# Patient Record
Sex: Male | Born: 1940 | Hispanic: No | Smoking: Never smoker
Health system: Southern US, Community
[De-identification: ages and names within clinical notes are randomized; demographics above are authoritative.]

## PROBLEM LIST (undated history)

## (undated) DIAGNOSIS — Z992 Dependence on renal dialysis: Secondary | ICD-10-CM

## (undated) DIAGNOSIS — I2510 Atherosclerotic heart disease of native coronary artery without angina pectoris: Secondary | ICD-10-CM

## (undated) DIAGNOSIS — I5040 Unspecified combined systolic (congestive) and diastolic (congestive) heart failure: Secondary | ICD-10-CM

## (undated) DIAGNOSIS — I10 Essential (primary) hypertension: Secondary | ICD-10-CM

## (undated) DIAGNOSIS — D649 Anemia, unspecified: Secondary | ICD-10-CM

## (undated) DIAGNOSIS — I509 Heart failure, unspecified: Secondary | ICD-10-CM

## (undated) DIAGNOSIS — N186 End stage renal disease: Secondary | ICD-10-CM

## (undated) DIAGNOSIS — I429 Cardiomyopathy, unspecified: Secondary | ICD-10-CM

## (undated) DIAGNOSIS — E119 Type 2 diabetes mellitus without complications: Secondary | ICD-10-CM

## (undated) HISTORY — DX: Essential (primary) hypertension: I10

## (undated) HISTORY — DX: Dependence on renal dialysis: N18.6

## (undated) HISTORY — DX: Unspecified combined systolic (congestive) and diastolic (congestive) heart failure: I50.40

## (undated) HISTORY — DX: Cardiomyopathy, unspecified: I42.9

## (undated) HISTORY — DX: Dependence on renal dialysis: Z99.2

## (undated) HISTORY — DX: Atherosclerotic heart disease of native coronary artery without angina pectoris: I25.10

## (undated) HISTORY — PX: SPINE SURGERY: SHX786

## (undated) HISTORY — DX: Type 2 diabetes mellitus without complications: E11.9

## (undated) HISTORY — DX: Anemia, unspecified: D64.9

---

## 1998-02-21 ENCOUNTER — Emergency Department (HOSPITAL_COMMUNITY): Admission: EM | Admit: 1998-02-21 | Discharge: 1998-02-21 | Payer: Self-pay | Admitting: Emergency Medicine

## 1999-07-18 ENCOUNTER — Ambulatory Visit (HOSPITAL_COMMUNITY): Admission: RE | Admit: 1999-07-18 | Discharge: 1999-07-18 | Payer: Self-pay | Admitting: Family Medicine

## 1999-07-18 ENCOUNTER — Encounter: Payer: Self-pay | Admitting: Family Medicine

## 2003-03-03 ENCOUNTER — Encounter: Payer: Self-pay | Admitting: *Deleted

## 2003-03-03 ENCOUNTER — Ambulatory Visit (HOSPITAL_COMMUNITY): Admission: RE | Admit: 2003-03-03 | Discharge: 2003-03-03 | Payer: Self-pay | Admitting: *Deleted

## 2008-12-06 ENCOUNTER — Emergency Department (HOSPITAL_COMMUNITY): Admission: EM | Admit: 2008-12-06 | Discharge: 2008-12-06 | Payer: Self-pay | Admitting: Emergency Medicine

## 2008-12-30 ENCOUNTER — Encounter: Admission: RE | Admit: 2008-12-30 | Discharge: 2008-12-30 | Payer: Self-pay | Admitting: Family Medicine

## 2009-01-03 ENCOUNTER — Emergency Department (HOSPITAL_COMMUNITY): Admission: EM | Admit: 2009-01-03 | Discharge: 2009-01-03 | Payer: Self-pay | Admitting: Emergency Medicine

## 2009-01-17 ENCOUNTER — Emergency Department (HOSPITAL_COMMUNITY): Admission: EM | Admit: 2009-01-17 | Discharge: 2009-01-17 | Payer: Self-pay | Admitting: Emergency Medicine

## 2009-01-31 ENCOUNTER — Ambulatory Visit (HOSPITAL_COMMUNITY): Admission: RE | Admit: 2009-01-31 | Discharge: 2009-01-31 | Payer: Self-pay | Admitting: Neurosurgery

## 2009-02-20 ENCOUNTER — Inpatient Hospital Stay (HOSPITAL_COMMUNITY): Admission: RE | Admit: 2009-02-20 | Discharge: 2009-03-01 | Payer: Self-pay | Admitting: Neurosurgery

## 2009-12-27 IMAGING — CR DG CHEST 2V
3 series · 3 of 3 positions shown · non-contrast
Comparison: No priors

CLINICAL DATA: Pre admit for lumbar stenosis/cold

CHEST - 2 VIEW

[view not recorded (1 of 3)]
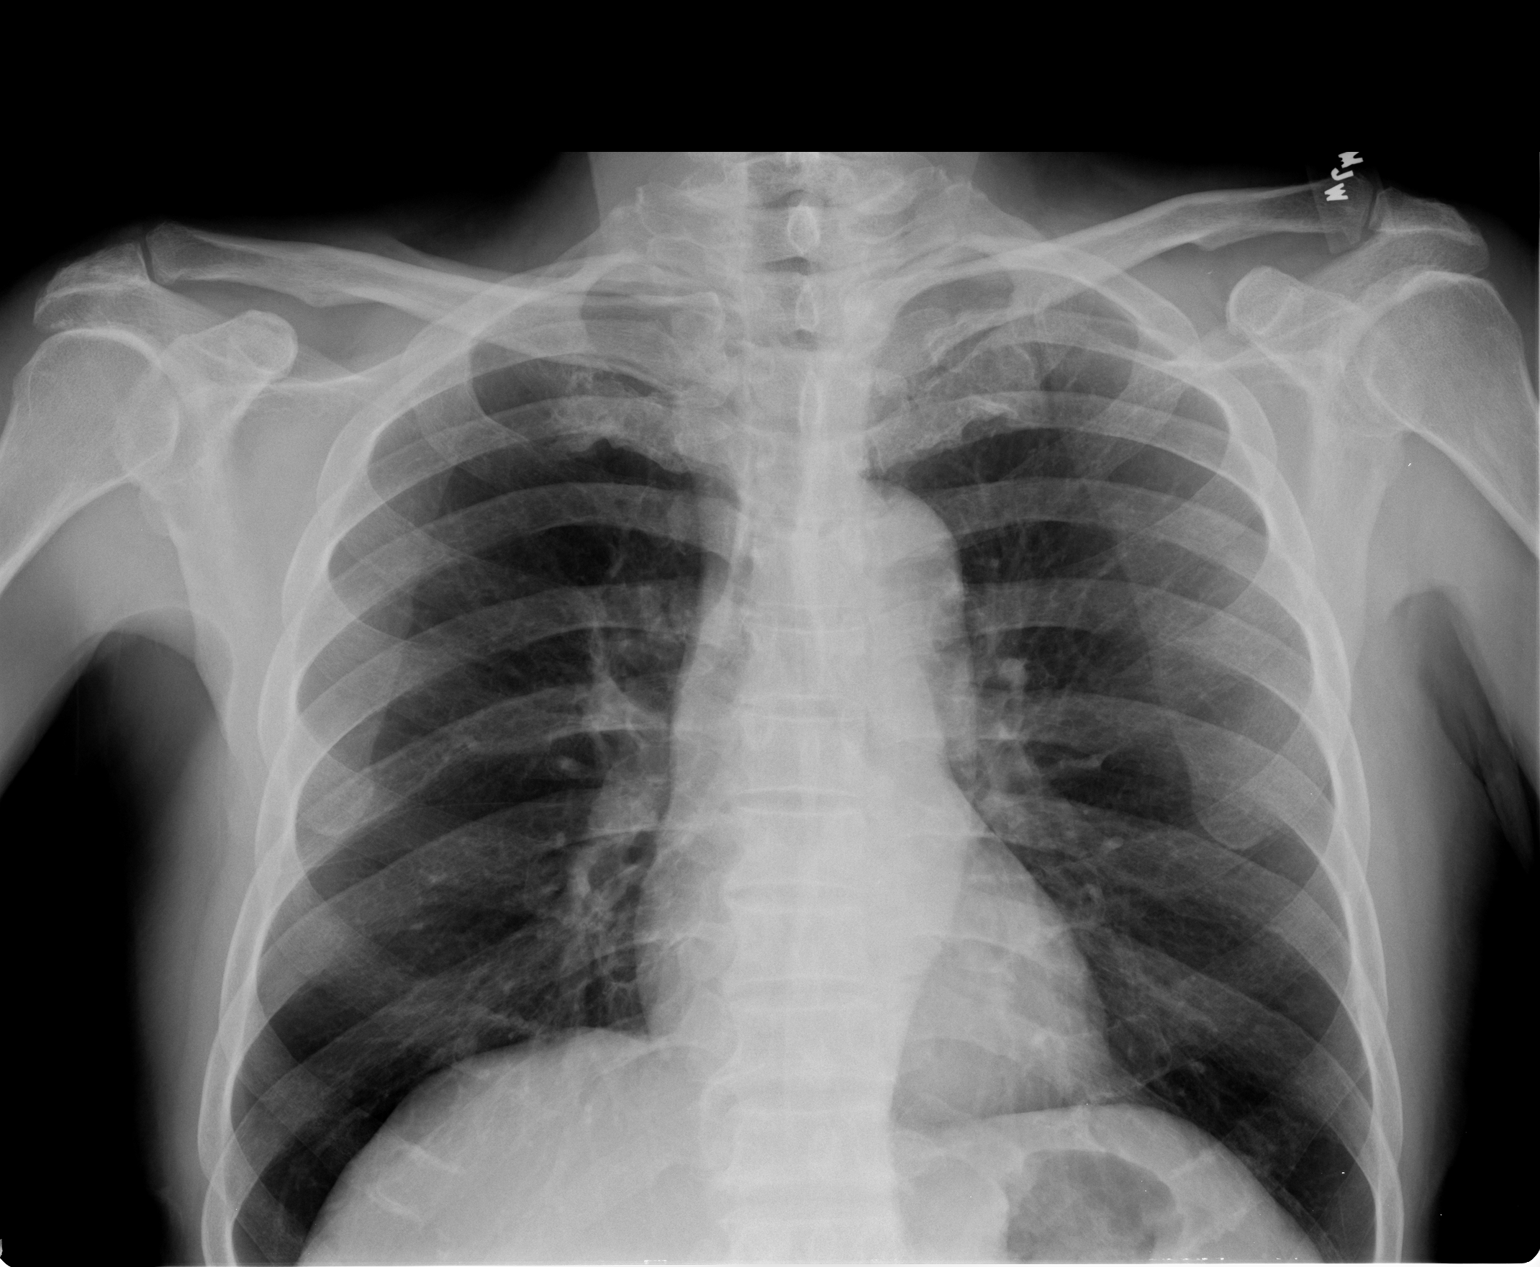

[view not recorded (2 of 3)]
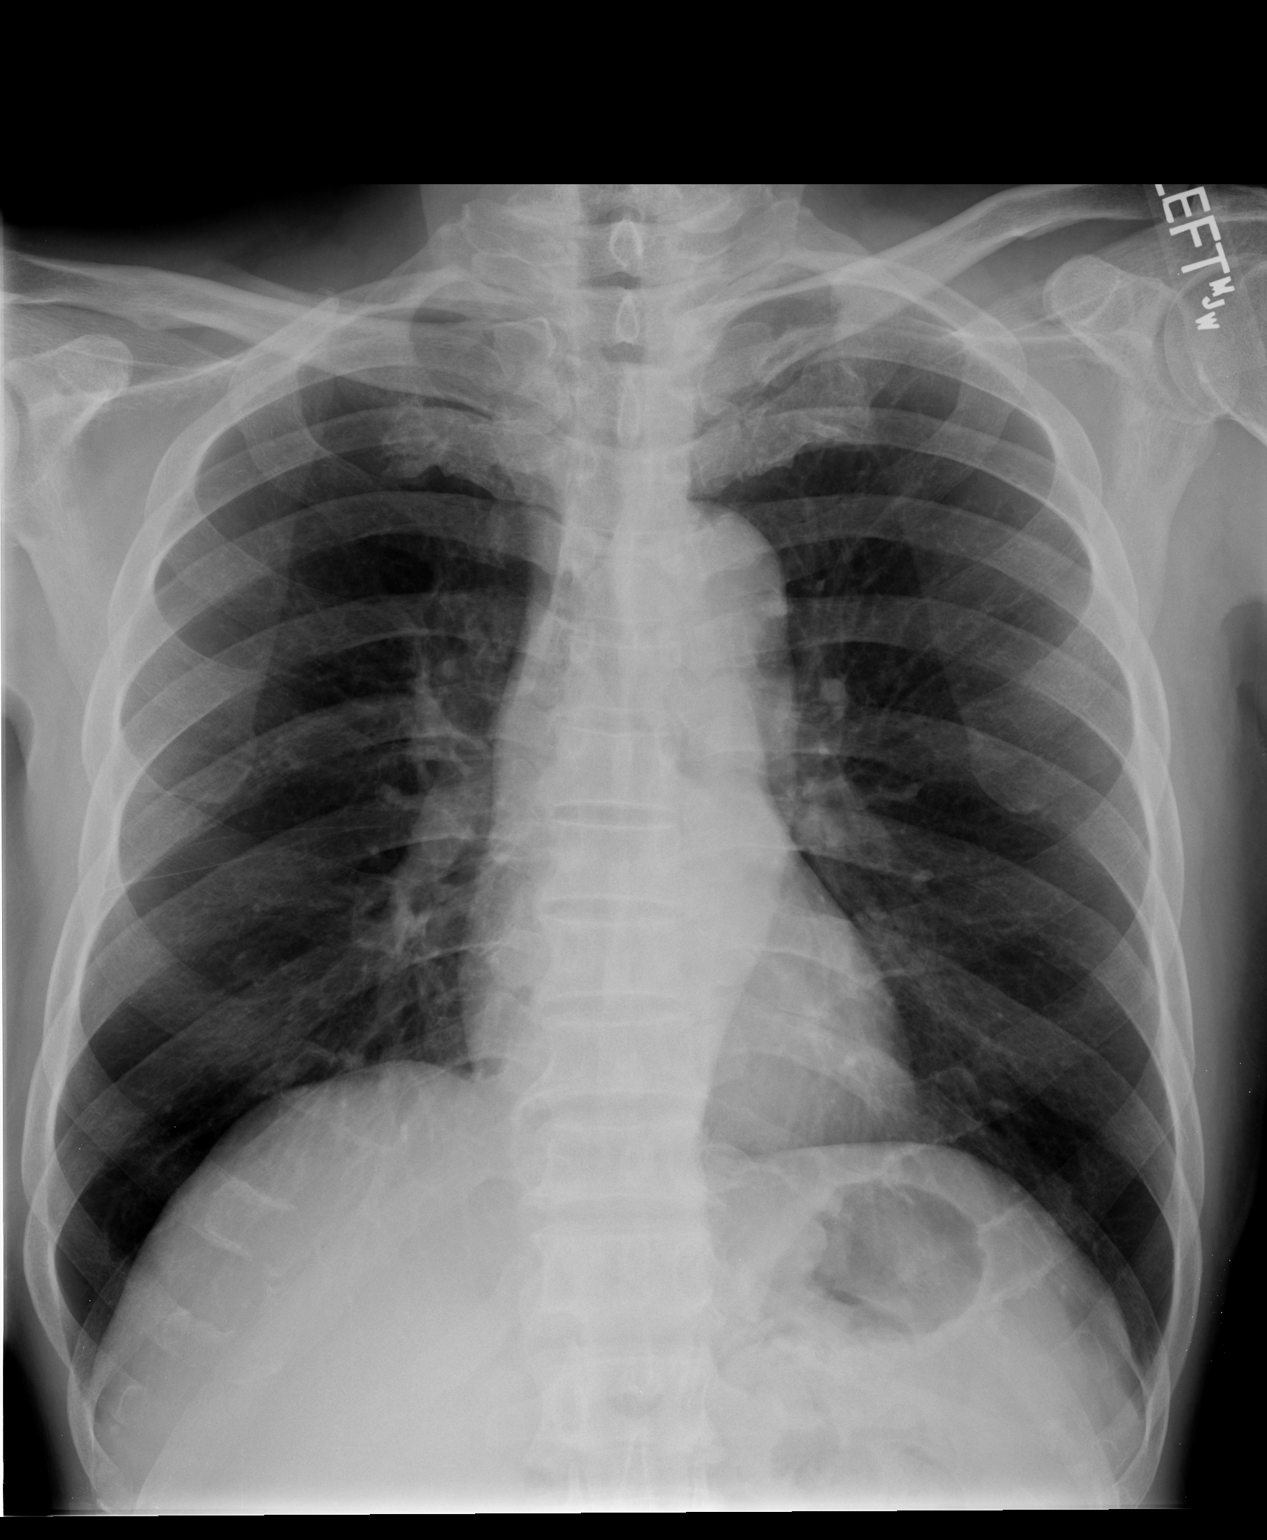

[view not recorded (3 of 3)]
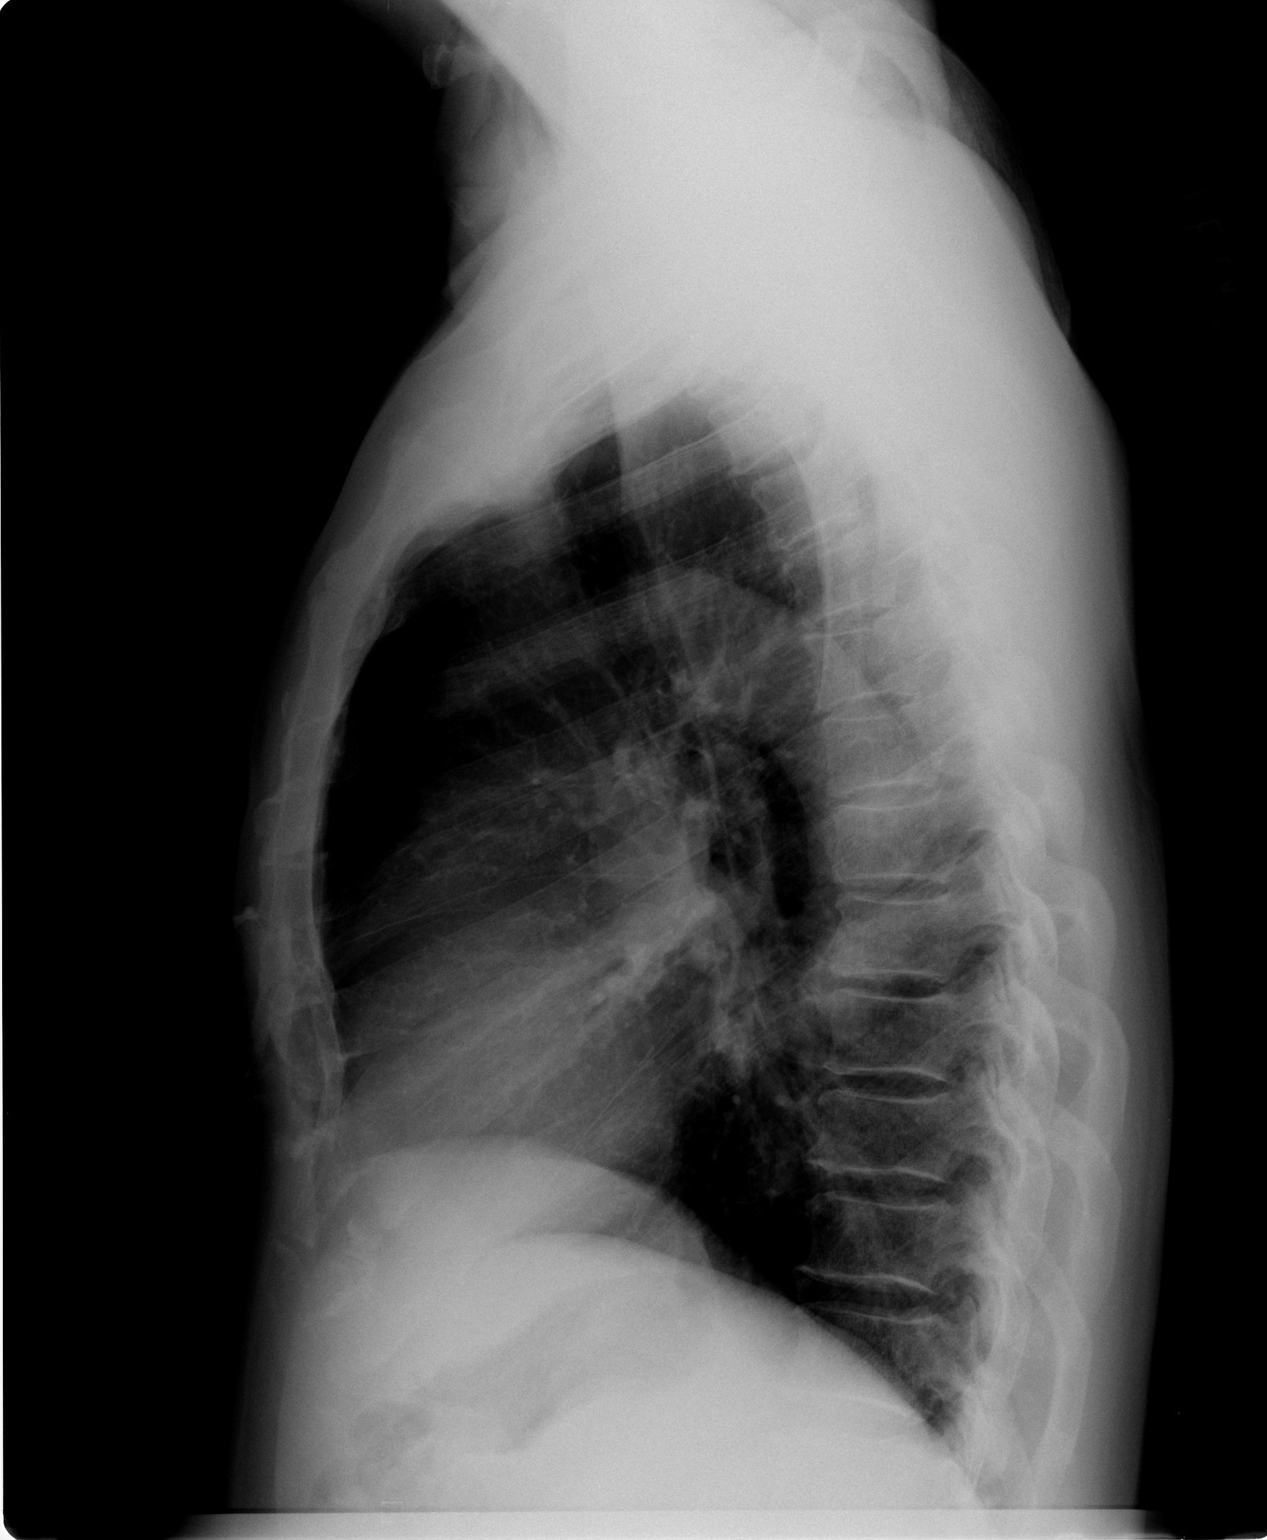

[3 of 3 positions shown; findings below may reference images not displayed]

FINDINGS: Heart normal.  Aorta mildly tortuous.  Lungs clear.
Osseous structures intact.
IMPRESSION: No active disease.

## 2010-12-01 ENCOUNTER — Encounter: Payer: Self-pay | Admitting: Family Medicine

## 2011-02-19 LAB — GLUCOSE, CAPILLARY
Glucose-Capillary: 170 mg/dL — ABNORMAL HIGH (ref 70–99)
Glucose-Capillary: 170 mg/dL — ABNORMAL HIGH (ref 70–99)
Glucose-Capillary: 172 mg/dL — ABNORMAL HIGH (ref 70–99)
Glucose-Capillary: 185 mg/dL — ABNORMAL HIGH (ref 70–99)
Glucose-Capillary: 186 mg/dL — ABNORMAL HIGH (ref 70–99)
Glucose-Capillary: 189 mg/dL — ABNORMAL HIGH (ref 70–99)
Glucose-Capillary: 191 mg/dL — ABNORMAL HIGH (ref 70–99)
Glucose-Capillary: 192 mg/dL — ABNORMAL HIGH (ref 70–99)
Glucose-Capillary: 193 mg/dL — ABNORMAL HIGH (ref 70–99)
Glucose-Capillary: 193 mg/dL — ABNORMAL HIGH (ref 70–99)
Glucose-Capillary: 195 mg/dL — ABNORMAL HIGH (ref 70–99)
Glucose-Capillary: 207 mg/dL — ABNORMAL HIGH (ref 70–99)
Glucose-Capillary: 207 mg/dL — ABNORMAL HIGH (ref 70–99)
Glucose-Capillary: 209 mg/dL — ABNORMAL HIGH (ref 70–99)
Glucose-Capillary: 225 mg/dL — ABNORMAL HIGH (ref 70–99)
Glucose-Capillary: 225 mg/dL — ABNORMAL HIGH (ref 70–99)
Glucose-Capillary: 234 mg/dL — ABNORMAL HIGH (ref 70–99)
Glucose-Capillary: 243 mg/dL — ABNORMAL HIGH (ref 70–99)
Glucose-Capillary: 244 mg/dL — ABNORMAL HIGH (ref 70–99)
Glucose-Capillary: 266 mg/dL — ABNORMAL HIGH (ref 70–99)
Glucose-Capillary: 282 mg/dL — ABNORMAL HIGH (ref 70–99)
Glucose-Capillary: 299 mg/dL — ABNORMAL HIGH (ref 70–99)
Glucose-Capillary: 104 mg/dL — ABNORMAL HIGH (ref 70–99)
Glucose-Capillary: 113 mg/dL — ABNORMAL HIGH (ref 70–99)
Glucose-Capillary: 153 mg/dL — ABNORMAL HIGH (ref 70–99)
Glucose-Capillary: 174 mg/dL — ABNORMAL HIGH (ref 70–99)
Glucose-Capillary: 200 mg/dL — ABNORMAL HIGH (ref 70–99)
Glucose-Capillary: 203 mg/dL — ABNORMAL HIGH (ref 70–99)
Glucose-Capillary: 228 mg/dL — ABNORMAL HIGH (ref 70–99)
Glucose-Capillary: 99 mg/dL (ref 70–99)

## 2011-02-19 LAB — URINE CULTURE: Culture: NO GROWTH

## 2011-02-19 LAB — BASIC METABOLIC PANEL
CO2: 27 mEq/L (ref 19–32)
CO2: 30 mEq/L (ref 19–32)
Calcium: 9.2 mg/dL (ref 8.4–10.5)
Chloride: 91 mEq/L — ABNORMAL LOW (ref 96–112)
Chloride: 96 mEq/L (ref 96–112)
Creatinine, Ser: 0.93 mg/dL (ref 0.4–1.5)
GFR calc Af Amer: >60 mL/min (ref >60)
GFR calc Af Amer: >60 mL/min (ref >60)
GFR calc Af Amer: >60 mL/min (ref >60)
GFR calc non Af Amer: >60 mL/min (ref >60)
Potassium: 3.4 mEq/L — ABNORMAL LOW (ref 3.5–5.1)
Potassium: 3.6 mEq/L (ref 3.5–5.1)
Potassium: 5 mEq/L (ref 3.5–5.1)
Sodium: 128 mEq/L — ABNORMAL LOW (ref 135–145)
Sodium: 136 mEq/L (ref 135–145)

## 2011-02-19 LAB — CULTURE, BLOOD (ROUTINE X 2): Culture: NO GROWTH

## 2011-02-19 LAB — CBC
HCT: 25.7 % — ABNORMAL LOW (ref 39.0–52.0)
HCT: 38.2 % — ABNORMAL LOW (ref 39.0–52.0)
Hemoglobin: 13.3 g/dL (ref 13.0–17.0)
MCHC: 34.9 g/dL (ref 30.0–36.0)
MCV: 92.2 fL (ref 78.0–100.0)
RBC: 2.78 MIL/uL — ABNORMAL LOW (ref 4.22–5.81)
RBC: 4.16 MIL/uL — ABNORMAL LOW (ref 4.22–5.81)

## 2011-02-19 LAB — URINALYSIS, ROUTINE W REFLEX MICROSCOPIC
Hgb urine dipstick: NEGATIVE
Ketones, ur: 15 mg/dL — AB
Nitrite: POSITIVE — AB
Urobilinogen, UA: 1 mg/dL (ref 0.0–1.0)

## 2011-02-19 LAB — URINE MICROSCOPIC-ADD ON

## 2011-02-20 LAB — URINALYSIS, ROUTINE W REFLEX MICROSCOPIC
Bilirubin Urine: NEGATIVE
Leukocytes, UA: NEGATIVE
Nitrite: NEGATIVE
Specific Gravity, Urine: 1.04 — ABNORMAL HIGH (ref 1.005–1.030)
Urobilinogen, UA: 0.2 mg/dL (ref 0.0–1.0)
pH: 6 (ref 5.0–8.0)

## 2011-02-20 LAB — BASIC METABOLIC PANEL
BUN: 17 mg/dL (ref 6–23)
Calcium: 9.6 mg/dL (ref 8.4–10.5)
Creatinine, Ser: 0.78 mg/dL (ref 0.4–1.5)
GFR calc non Af Amer: >60 mL/min (ref >60)
Glucose, Bld: 334 mg/dL — ABNORMAL HIGH (ref 70–99)
Potassium: 4.7 mEq/L (ref 3.5–5.1)

## 2011-02-20 LAB — CBC
HCT: 42.3 % (ref 39.0–52.0)
Platelets: 229 10*3/uL (ref 150–400)
RDW: 12.8 % (ref 11.5–15.5)

## 2011-02-20 LAB — URINE MICROSCOPIC-ADD ON

## 2011-02-20 LAB — GLUCOSE, CAPILLARY: Glucose-Capillary: 342 mg/dL — ABNORMAL HIGH (ref 70–99)

## 2011-03-25 NOTE — Op Note (Signed)
NAMEADREN, DOLLINS                  ACCOUNT NO.:  [id number]   MEDICAL RECORD NO.:  [id number]          PATIENT TYPE:  INP   LOCATION:  3018                         FACILITY:  [facility]   PHYSICIAN:  Hilda Lias, M.D.   DATE OF BIRTH:  28-Jul-1941   DATE OF PROCEDURE:  02/20/2009  DATE OF DISCHARGE:                               OPERATIVE REPORT   PREOPERATIVE DIAGNOSIS:  Lumbar stenosis with left L3-L4 radiculopathy.   POSTOPERATIVE DIAGNOSIS:  Lumbar stenosis with left L3-L4 radiculopathy.   PROCEDURES:  1. Bilateral L2, 3, 4, 5 laminectomy.  2. Foraminotomy to decompress the L2, 3, 4, 5, S1 nerve root.  3. Repair of a small midline iatrogenic leak post-epidural injection.   SURGEON:  Hilda Lias, MD   ASSISTANT:  Clydene Fake, MD   CLINICAL HISTORY:  Mr. Cypert is a 70 year old gentleman who came to my  office complaining of back pain with radiation to both the legs, the  left worse than right one.  The patient has a history of diabetes.  The  patient had epidural injections with no improvement.  X-rays showed  severe stenosis from 2-3 down to 5-1.  Surgery was advised.   PROCEDURE:  The patient was taken to the OR, and after intubation, he  was positioned in a prone manner.  The skin was cleaned with DuraPrep.  Midline incision from L2 down to L5-S1 was made and muscle was retracted  bilaterally.  We were able to identify the spine x-ray, the spinous  process of 2, 3, 4, 5.  Using the Stille rongeur, we removed the spinous  process of 2, 3, 4, 5.  Immediately, we found that the patient had  severe stenosis at the level 3, 4, 5, with overgrow of the facet and  thickness of the ligament.  For that, we had to use a 1- and 2-mm  Kerrison punch to remove the yellow ligament.  There was an area where  the patient had the epidural injection because the yellow ligament was  kind of whitish.  Once we decompressed the area, there was a small  pinpoint dura matter which was taken  care with a single stitch of 6-0  Prolene.  Then using the drill as well as 2- and 3-mm Kerrison punch, we  decompressed bilaterally.  Although the central stenosis was severe all  over the foramina, the worse was at the level of 3-4 and 4-5.  At the  end, we had plane of decompression.  We investigated every single nerve  roots and there was no evidence of any ruptured disk.  Valsalva maneuver  was negative.  Then using the compression, we did a posterolateral  arthrodesis from L2 through L5-S1 applying the bone in the lateral  aspect of the facet.  Then, the wound was closed with Vicryl and Steri-  Strips.           ______________________________  Hilda Lias, M.D.     EB/MEDQ  D:  02/20/2009  T:  02/21/2009  Job:  [id number]

## 2011-03-28 NOTE — Discharge Summary (Signed)
Albert Grant, Albert Grant                  ACCOUNT NO.:  [id number]   MEDICAL RECORD NO.:  [id number]          PATIENT TYPE:  INP   LOCATION:  3018                         FACILITY:  [facility]   PHYSICIAN:  Hilda Lias, M.D.   DATE OF BIRTH:  May 14, 1941   DATE OF ADMISSION:  02/20/2009  DATE OF DISCHARGE:  03/01/2009                               DISCHARGE SUMMARY   ADMISSION DIAGNOSIS:  Lumbar stenosis, L2 to L5-S1.   FINAL DIAGNOSIS:  Lumbar stenosis, L2 to L5-S1.   CLINICAL HISTORY:  Mr. Stancil is a gentleman who came to my office  complaining of back pain worse to the both legs with quite a bit of  proximal weakness.  The patient has failed with conservative treatment.  X-rays showed that he has diffuse stenosis from L2 down to L5-S1.  The  patient has failed with conservative treatment to an epidural injection.  Laboratory normal.   HOSPITAL COURSE:  The patient was taken to surgery and bilateral  laminectomy from L2 to L5-S1 was done followed by a posterolateral  arthrodesis with autograft.  After surgery, the patient developed quite  a bit of pain.  He has poor physical activity and he was quite sedated.  He was seen by Physical Therapy.  Eventually, all the medications for  pain were stopped.  He was given once orally p.o. Tylenol.  After  several days, he was more awake, more active, and then on March 02, 2009, the patient went home.  The pain he was complaining was mostly  incisional pain.   CONDITION ON DISCHARGE:  Improvement.   MEDICATIONS:  He went home with tramadol for pain and Flexeril.   DIET:  Regular.   ACTIVITY:  Not to bend, not to do any lifting.   FOLLOWUP:  He will be seen by me in my office in 4 weeks.  He is going  to have outpatient physical therapy.           ______________________________  Hilda Lias, M.D.     EB/MEDQ  D:  04/17/2009  T:  04/17/2009  Job:  [id number]

## 2013-08-10 ENCOUNTER — Other Ambulatory Visit (HOSPITAL_COMMUNITY): Payer: Self-pay | Admitting: Internal Medicine

## 2013-08-10 DIAGNOSIS — R109 Unspecified abdominal pain: Secondary | ICD-10-CM

## 2013-08-11 ENCOUNTER — Encounter (HOSPITAL_COMMUNITY): Admission: RE | Admit: 2013-08-11 | Payer: Medicare Other | Source: Ambulatory Visit

## 2013-09-04 ENCOUNTER — Inpatient Hospital Stay: Payer: Self-pay | Admitting: Internal Medicine

## 2013-09-04 LAB — COMPREHENSIVE METABOLIC PANEL
Albumin: 2.5 g/dL — ABNORMAL LOW (ref 3.4–5.0)
Alkaline Phosphatase: 183 U/L — ABNORMAL HIGH (ref 50–136)
BUN: 40 mg/dL — ABNORMAL HIGH (ref 7–18)
Calcium, Total: 8 mg/dL — ABNORMAL LOW (ref 8.5–10.1)
Co2: 27 mmol/L (ref 21–32)
Creatinine: 4.85 mg/dL — ABNORMAL HIGH (ref 0.60–1.30)
EGFR (Non-African Amer.): 11 — ABNORMAL LOW
Osmolality: 284 (ref 275–301)
Potassium: 3.9 mmol/L (ref 3.5–5.1)
SGOT(AST): 30 U/L (ref 15–37)
SGPT (ALT): 16 U/L (ref 12–78)
Sodium: 134 mmol/L — ABNORMAL LOW (ref 136–145)
Total Protein: 7 g/dL (ref 6.4–8.2)

## 2013-09-04 LAB — CBC WITH DIFFERENTIAL/PLATELET
Basophil %: 0.6 %
Eosinophil #: 0.6 10*3/uL (ref 0.0–0.7)
Eosinophil %: 4.1 %
HCT: 32.3 % — ABNORMAL LOW (ref 40.0–52.0)
HGB: 10.9 g/dL — ABNORMAL LOW (ref 13.0–18.0)
Lymphocyte #: 0.9 10*3/uL — ABNORMAL LOW (ref 1.0–3.6)
Lymphocyte %: 6 %
MCH: 30.4 pg (ref 26.0–34.0)
MCHC: 33.8 g/dL (ref 32.0–36.0)
MCV: 90 fL (ref 80–100)
Monocyte #: 0.5 x10 3/mm (ref 0.2–1.0)
Monocyte %: 3.6 %
Neutrophil #: 12.5 10*3/uL — ABNORMAL HIGH (ref 1.4–6.5)
RDW: 16.3 % — ABNORMAL HIGH (ref 11.5–14.5)
WBC: 14.6 10*3/uL — ABNORMAL HIGH (ref 3.8–10.6)

## 2013-09-04 LAB — CK TOTAL AND CKMB (NOT AT ARMC)
CK, Total: 95 U/L (ref 35–232)
CK, Total: 96 U/L (ref 35–232)
CK-MB: 3.1 ng/mL (ref 0.5–3.6)
CK-MB: 3.7 ng/mL — ABNORMAL HIGH (ref 0.5–3.6)
CK-MB: 3.8 ng/mL — ABNORMAL HIGH (ref 0.5–3.6)

## 2013-09-04 LAB — TROPONIN I
Troponin-I: 0.07 ng/mL — ABNORMAL HIGH
Troponin-I: 0.76 ng/mL — ABNORMAL HIGH
Troponin-I: 0.65 ng/mL — ABNORMAL HIGH

## 2013-09-05 LAB — BASIC METABOLIC PANEL
Anion Gap: 8 (ref 7–16)
BUN: 41 mg/dL — ABNORMAL HIGH (ref 7–18)
Chloride: 98 mmol/L (ref 98–107)
Co2: 29 mmol/L (ref 21–32)
Glucose: 137 mg/dL — ABNORMAL HIGH (ref 65–99)
Sodium: 135 mmol/L — ABNORMAL LOW (ref 136–145)

## 2013-09-05 LAB — HEMOGLOBIN A1C: Hemoglobin A1C: 5.9 % (ref 4.2–6.3)

## 2013-09-05 LAB — CBC WITH DIFFERENTIAL/PLATELET
Basophil #: 0.1 10*3/uL (ref 0.0–0.1)
Basophil %: 1.4 %
Eosinophil %: 2.9 %
HCT: 27 % — ABNORMAL LOW (ref 40.0–52.0)
HGB: 9.2 g/dL — ABNORMAL LOW (ref 13.0–18.0)
MCHC: 33.9 g/dL (ref 32.0–36.0)
MCV: 89 fL (ref 80–100)
Monocyte %: 8.3 %
Neutrophil #: 2.7 10*3/uL (ref 1.4–6.5)
Neutrophil %: 69.2 %
Platelet: 199 10*3/uL (ref 150–440)
RBC: 3.02 10*6/uL — ABNORMAL LOW (ref 4.40–5.90)
RDW: 15.8 % — ABNORMAL HIGH (ref 11.5–14.5)

## 2013-09-05 LAB — PHOSPHORUS: Phosphorus: 4.1 mg/dL (ref 2.5–4.9)

## 2013-09-06 LAB — BASIC METABOLIC PANEL
BUN: 27 mg/dL — ABNORMAL HIGH (ref 7–18)
Chloride: 99 mmol/L (ref 98–107)
Co2: 30 mmol/L (ref 21–32)
Creatinine: 3.31 mg/dL — ABNORMAL HIGH (ref 0.60–1.30)
EGFR (African American): 20 — ABNORMAL LOW
Glucose: 120 mg/dL — ABNORMAL HIGH (ref 65–99)
Osmolality: 273 (ref 275–301)
Potassium: 4.1 mmol/L (ref 3.5–5.1)

## 2013-09-09 LAB — CULTURE, BLOOD (SINGLE)

## 2013-10-08 ENCOUNTER — Other Ambulatory Visit: Payer: Self-pay | Admitting: Nephrology

## 2013-10-08 LAB — HEMOGLOBIN: HGB: 8.3 g/dL — ABNORMAL LOW (ref 13.0–18.0)

## 2013-10-18 ENCOUNTER — Non-Acute Institutional Stay (SKILLED_NURSING_FACILITY): Payer: Medicare Other | Admitting: Internal Medicine

## 2013-10-18 DIAGNOSIS — I2589 Other forms of chronic ischemic heart disease: Secondary | ICD-10-CM

## 2013-10-18 DIAGNOSIS — E1129 Type 2 diabetes mellitus with other diabetic kidney complication: Secondary | ICD-10-CM

## 2013-10-18 DIAGNOSIS — N186 End stage renal disease: Secondary | ICD-10-CM

## 2013-10-18 DIAGNOSIS — I2510 Atherosclerotic heart disease of native coronary artery without angina pectoris: Secondary | ICD-10-CM

## 2013-10-18 DIAGNOSIS — I255 Ischemic cardiomyopathy: Secondary | ICD-10-CM

## 2013-12-12 ENCOUNTER — Encounter: Payer: Self-pay | Admitting: *Deleted

## 2013-12-12 ENCOUNTER — Non-Acute Institutional Stay (SKILLED_NURSING_FACILITY): Payer: Medicare Other | Admitting: Family

## 2013-12-12 DIAGNOSIS — T7840XA Allergy, unspecified, initial encounter: Secondary | ICD-10-CM

## 2013-12-12 DIAGNOSIS — Z992 Dependence on renal dialysis: Secondary | ICD-10-CM

## 2013-12-12 DIAGNOSIS — T783XXA Angioneurotic edema, initial encounter: Secondary | ICD-10-CM

## 2013-12-12 DIAGNOSIS — N186 End stage renal disease: Secondary | ICD-10-CM

## 2013-12-14 ENCOUNTER — Encounter: Payer: Self-pay | Admitting: Family

## 2013-12-14 DIAGNOSIS — T7840XA Allergy, unspecified, initial encounter: Secondary | ICD-10-CM | POA: Insufficient documentation

## 2013-12-14 NOTE — Progress Notes (Signed)
Patient ID: Albert Grant, male   DOB: [birth year], 73 y.o.   MRN: [id number]  Date: 12/14/13 Facility: [facility]  Code Status:  Full Code  Chief Complaint  Patient presents with  . Acute Visit    Angioedema /Itching    HPI: Pt presents with onset of facial swelling and itching. Pt noted taking Lisinopril and denies allergies to medications. Pt denies visual disturbances, nausea, vomiting, diarrhea, chest pain, SOB or HA. Pt denies history of allergic reactions to medications/environment/ detergents/ fibers.  Pt reports associated itching to trunk and BUE/BLE. Pt endorses fear of new medications added to treatment plan for fear of allergic reaction.  No further issues/concerns expressed at present.     Allergies  Allergen Reactions  . Lisinopril      Medication List       This list is accurate as of: 12/12/13 11:59 PM.  Always use your most recent med list.               atorvastatin 80 MG tablet  Commonly known as:  LIPITOR  Take 80 mg by mouth at bedtime. For hypercholesterolemia     carvedilol 25 MG tablet  Commonly known as:  COREG  Take 25 mg by mouth 2 (two) times daily with a meal. For HTN     clopidogrel 75 MG tablet  Commonly known as:  PLAVIX  Take 75 mg by mouth daily with breakfast.     feeding supplement (PRO-STAT SUGAR FREE 64) Liqd  Take 30 mLs by mouth 2 (two) times daily.     ferrous sulfate 325 (65 FE) MG tablet  Take 325 mg by mouth 3 (three) times daily with meals.     hydrALAZINE 50 MG tablet  Commonly known as:  APRESOLINE  Take 50 mg by mouth 3 (three) times daily. For HTN     insulin aspart 100 UNIT/ML injection  Commonly known as:  novoLOG  Inject 2 Units into the skin 3 (three) times daily before meals.     insulin glargine 100 UNIT/ML injection  Commonly known as:  LANTUS  Inject 3 Units into the skin at bedtime.     isosorbide mononitrate 60 MG 24 hr tablet  Commonly known as:  IMDUR  Take 60 mg by mouth daily. For HTN     lisinopril 20 MG tablet  Commonly known as:  PRINIVIL,ZESTRIL  Take 20 mg by mouth at bedtime.     metoprolol tartrate 12.5 mg Tabs tablet  Commonly known as:  LOPRESSOR  Take 12.5 mg by mouth daily.     nitroGLYCERIN 0.4 MG SL tablet  Commonly known as:  NITROSTAT  Place 0.4 mg under the tongue every 5 (five) minutes as needed for chest pain (May repeat for 3 doses as needed for chest pain. If chest pain persist call the MD.).     ondansetron 4 MG tablet  Commonly known as:  ZOFRAN  Take 4 mg by mouth every 8 (eight) hours as needed for nausea or vomiting.     pantoprazole 40 MG tablet  Commonly known as:  PROTONIX  Take 40 mg by mouth 2 (two) times daily. For esophageal reflux     PHOSLO 667 MG capsule  Generic drug:  calcium acetate  Take 667 mg by mouth 3 (three) times daily with meals.     ROBITUSSIN CHEST CONGESTION 100 MG/5ML syrup  Generic drug:  guaifenesin  Take 200 mg by mouth every 4 (four) hours as needed for cough. For  cough     saccharomyces boulardii 250 MG capsule  Commonly known as:  FLORASTOR  Take 250 mg by mouth 2 (two) times daily. For 14  Days     sertraline 100 MG tablet  Commonly known as:  ZOLOFT  Take 100 mg by mouth daily. For mood     SUPER B COMPLEX Tabs  Take by mouth daily.         DATA REVIEWED  Laboratory Studies: Reviewed     Past Medical History  Diagnosis Date  . 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 Hypertension   . CAD (coronary artery disease)      with angioplasty  . 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 Cardiomyopathy     with ejection fraction of 45 %  . Heart failure, systolic and diastolic   . Anemia     Past Surgical History  Procedure Laterality Date  . Spine surgery      spinal fusion      Review of Systems  Respiratory: Negative.  Negative for cough and shortness of breath.   Cardiovascular: Negative.   Skin: Positive for itching and rash.       Facial Swelling    Neurological: Negative.      Physical Exam Filed Vitals:   12/14/13 1519  BP: 142/66  Pulse: 84  Temp: 97.8 F (36.6 C)  Resp: 20   There is no height or weight on file to calculate BMI. Physical Exam  Constitutional: He is oriented to person, place, and time.  HENT:  Facial Edema  Cardiovascular: Normal rate and regular rhythm.   Pulmonary/Chest: Effort normal and breath sounds normal.  Neurological: He is alert and oriented to person, place, and time.  Skin: Rash noted. Rash is maculopapular. There is erythema.  Erythema to face Maculopapular rash to BUE/BLE     ASSESSMENT/PLAN  Benadryl 12. 5 mg po x 1  Lisinopril d/c-Added to list of allergies Metoprolol 12.5 mg daily w/ hold parameters Stat CBC w/ diff Prednisone 20 mg po daily x 5 days-Collaboration  With Dr. Jefm Milesobson Sarna Lotion to intact skin Triamcinolone topical 0.5% bid to BUE/BLE and trunk for 10 days   Follow up:prn

## 2013-12-16 ENCOUNTER — Encounter: Payer: Self-pay | Admitting: Vascular Surgery

## 2013-12-16 ENCOUNTER — Other Ambulatory Visit: Payer: Self-pay | Admitting: *Deleted

## 2013-12-19 ENCOUNTER — Inpatient Hospital Stay (HOSPITAL_COMMUNITY): Admission: RE | Admit: 2013-12-19 | Payer: Medicare Other | Source: Ambulatory Visit

## 2013-12-19 ENCOUNTER — Other Ambulatory Visit: Payer: Self-pay | Admitting: Vascular Surgery

## 2013-12-19 ENCOUNTER — Ambulatory Visit: Payer: Medicare Other | Admitting: Vascular Surgery

## 2013-12-19 ENCOUNTER — Encounter (HOSPITAL_COMMUNITY): Payer: Medicare Other

## 2013-12-19 DIAGNOSIS — Z01810 Encounter for preprocedural cardiovascular examination: Secondary | ICD-10-CM

## 2013-12-19 DIAGNOSIS — N186 End stage renal disease: Secondary | ICD-10-CM

## 2013-12-22 ENCOUNTER — Encounter: Payer: Self-pay | Admitting: Internal Medicine

## 2013-12-22 DIAGNOSIS — I2510 Atherosclerotic heart disease of native coronary artery without angina pectoris: Secondary | ICD-10-CM | POA: Insufficient documentation

## 2013-12-22 DIAGNOSIS — I255 Ischemic cardiomyopathy: Secondary | ICD-10-CM | POA: Insufficient documentation

## 2013-12-22 NOTE — Progress Notes (Signed)
Patient ID: Albert Grant, male   DOB: 01/13/1941, 73 y.o.   MRN: [id number]               HISTORY & PHYSICAL  DATE: 10/18/2013     FACILITY: [facility]  LEVEL OF CARE: SNF (31)  ALLERGIES:  Allergies  Allergen Reactions  . Lisinopril     CHIEF COMPLAINT:  Manage cardiomyopathy, diabetes mellitus, and CAD.    HISTORY OF PRESENT ILLNESS:  The patient is a 73 year-old, [place] male who was admitted to this facility for short-term rehabilitation.  He has the following problems:    CARDIOMYOPATHY: The patient's cardiomyopathy remains stable. Patient denies increasing lower extremity swelling, shortness of breath, chest pain, palpitations, orthopnea or PNDs. No complications reported from the medications currently being used.    DM:pt's DM remains stable.  Pt denies polyuria, polydipsia, polyphagia, changes in vision or hypoglycemic episodes.  No complications noted from the medication presently being used.  Last hemoglobin A1c is:    Not available.    CAD: The angina has been stable. The patient denies dyspnea on exertion, orthopnea, pedal edema, palpitations and paroxysmal nocturnal dyspnea. No complications noted from the medication presently being used.    PAST MEDICAL HISTORY :  Past Medical History  Diagnosis Date  . 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 Hypertension   . CAD (coronary artery disease)      with angioplasty  . 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 Cardiomyopathy     with ejection fraction of 45 %  . Heart failure, systolic and diastolic   . Anemia    CHF.    PAST SURGICAL HISTORY: Past Surgical History  Procedure Laterality Date  . Spine surgery      spinal fusion   AV fistula placement.     SOCIAL HISTORY: MARITAL HISTORY:  The patient is divorced.   TOBACCO USE:  Denies tobacco use. ALCOHOL:  Denies alcohol use. ILLICIT DRUGS:  Denies illicit drug use.    FAMILY HISTORY: The patient cannot recall.     CURRENT MEDICATIONS: Reviewed per [facility]  REVIEW OF SYSTEMS:  See HPI otherwise 14 point ROS is negative.  PHYSICAL EXAMINATION  VS:  Not recorded.    GENERAL: no acute distress, normal body habitus EYES: conjunctivae normal, sclerae normal, normal eye lids MOUTH/THROAT: lips without lesions,no lesions in the mouth,tongue is without lesions,uvula elevates in midline NECK: supple, trachea midline, no neck masses, no thyroid tenderness, no thyromegaly LYMPHATICS: no LAN in the neck, no supraclavicular LAN RESPIRATORY: breathing is even & unlabored, BS CTAB CARDIAC: RRR, no murmur,no extra heart sounds, no edema GI:  ABDOMEN: abdomen soft, normal BS, no masses, no tenderness  LIVER/SPLEEN: no hepatomegaly, no splenomegaly MUSCULOSKELETAL: HEAD: normal to inspection & palpation BACK: no kyphosis, scoliosis or spinal processes tenderness EXTREMITIES: LEFT UPPER EXTREMITY: full range of motion, normal strength & tone RIGHT UPPER EXTREMITY:  full range of motion, normal strength & tone LEFT LOWER EXTREMITY:  full range of motion, normal strength & tone RIGHT LOWER EXTREMITY:  full range of motion, normal strength & tone PSYCHIATRIC: the patient is alert & oriented to person, affect & behavior appropriate  LABS/RADIOLOGY: In 06/2013:  Glucose 164, sodium 134, potassium 4.2, chloride 102, bicarbonate 24, BUN 18, creatinine 3.7, calcium 8.6, albumin 2.3, total bilirubin 0.8, alkaline phosphatase 199, AST 22, ALT 25.    WBC 4.9, hemoglobin 9.7, platelets 210.  ASSESSMENT/PLAN:  Cardiomyopathy.  Well compensated.    Diabetes mellitus with renal complications.  Check hemoglobin A1c.    CAD.  Stable.     End-stage renal disease.  On hemodialysis.    Anemia of chronic kidney disease.  Continue iron.  Reassess hemoglobin level.     Hyperlipidemia.  Check fasting lipid panel.    Check CBC and CMP.    THN Metrics:   Not on aspirin.  Nonsmoker.    I have reviewed patient's medical  records received at admission/from hospitalization.  CPT CODE: [id number]

## 2014-01-09 ENCOUNTER — Encounter: Payer: Self-pay | Admitting: Vascular Surgery

## 2014-01-10 ENCOUNTER — Other Ambulatory Visit: Payer: Self-pay | Admitting: *Deleted

## 2014-01-10 ENCOUNTER — Encounter: Payer: Self-pay | Admitting: *Deleted

## 2014-01-10 ENCOUNTER — Encounter: Payer: Self-pay | Admitting: Vascular Surgery

## 2014-01-10 ENCOUNTER — Ambulatory Visit (INDEPENDENT_AMBULATORY_CARE_PROVIDER_SITE_OTHER)
Admission: RE | Admit: 2014-01-10 | Discharge: 2014-01-10 | Disposition: A | Discharge status: Home / Self Care | Payer: Medicare Other | Source: Ambulatory Visit | Attending: Vascular Surgery | Admitting: Vascular Surgery

## 2014-01-10 ENCOUNTER — Ambulatory Visit (HOSPITAL_COMMUNITY)
Admission: RE | Admit: 2014-01-10 | Discharge: 2014-01-10 | Disposition: A | Discharge status: Home / Self Care | Payer: Medicare Other | Source: Ambulatory Visit | Attending: Vascular Surgery | Admitting: Vascular Surgery

## 2014-01-10 ENCOUNTER — Ambulatory Visit (INDEPENDENT_AMBULATORY_CARE_PROVIDER_SITE_OTHER): Payer: Medicare Other | Admitting: Vascular Surgery

## 2014-01-10 VITALS — BP 177/84 | HR 58 | Resp 14 | Ht 71.0 in | Wt 160.9 lb

## 2014-01-10 DIAGNOSIS — Z01810 Encounter for preprocedural cardiovascular examination: Secondary | ICD-10-CM

## 2014-01-10 DIAGNOSIS — N186 End stage renal disease: Secondary | ICD-10-CM

## 2014-01-10 NOTE — Progress Notes (Signed)
Patient name: Albert HaberRene Paget MRN: [id number] DOB: 03/29/1941 Sex: male   Referred by: Juel BurrowLin  Reason for referral:  Chief Complaint  Patient presents with  . Re-evaluation    Pt. needs new access old It cimino " doubt beyond salvage"  Fistulogram  Dr. Juel BurrowLin    HISTORY OF PRESENT ILLNESS: Patient is a 73 year old gentleman with end-stage renal disease currently being dialyzed via a right IJ hemodialysis catheter. Apparently he had a left Cimino fistula placed that it due to several years ago. A physical exam this is very sclerotic and unusable. I also have reports that I reviewed from a recent shuntogram with Dr.Lin. This also shows a sclerotic unusable forearm fistula. He is here for discussion of additional new access placement. He dialyzes on Tuesday Thursday and Saturday  Past Medical History  Diagnosis Date  . 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 Hypertension   . CAD (coronary artery disease)      with angioplasty  . 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 Cardiomyopathy     with ejection fraction of 45 %  . Heart failure, systolic and diastolic   . Anemia     Past Surgical History  Procedure Laterality Date  . Spine surgery      spinal fusion    History   Social History  . Marital Status: Married    Spouse Name: N/A    Number of Children: N/A  . Years of Education: N/A   Occupational History  . Not on file.   Social History Main Topics  . Smoking status: Never Smoker   . Smokeless tobacco: Never Used  . Alcohol Use: No  . Drug Use: No  . Sexual Activity: Not on file   Other Topics Concern  . Not on file   Social History Narrative  . No narrative on file    History reviewed. No pertinent family history.  Allergies as of 01/10/2014 - Review Complete 01/10/2014  Allergen Reaction Noted  . Lisinopril  12/12/2013    Current Outpatient Prescriptions on File Prior to Visit  Medication Sig Dispense Refill  . Amino  Acids-Protein Hydrolys (FEEDING SUPPLEMENT, PRO-STAT SUGAR FREE 64,) LIQD Take 30 mLs by mouth 2 (two) times daily.      Marland Kitchen. atorvastatin (LIPITOR) 80 MG tablet Take 80 mg by mouth at bedtime. For hypercholesterolemia      . B Complex-C (SUPER B COMPLEX) TABS Take by mouth daily.      . calcium acetate (PHOSLO) 667 MG capsule Take 667 mg by mouth 3 (three) times daily with meals.      . carvedilol (COREG) 25 MG tablet Take 25 mg by mouth 2 (two) times daily with a meal. For HTN      . clopidogrel (PLAVIX) 75 MG tablet Take 75 mg by mouth daily with breakfast.      . ferrous sulfate 325 (65 FE) MG tablet Take 325 mg by mouth 3 (three) times daily with meals.      Marland Kitchen. guaifenesin (ROBITUSSIN CHEST CONGESTION) 100 MG/5ML syrup Take 200 mg by mouth every 4 (four) hours as needed for cough. For cough      . hydrALAZINE (APRESOLINE) 50 MG tablet Take 50 mg by mouth 3 (three) times daily. For HTN      . insulin aspart (NOVOLOG) 100 UNIT/ML injection Inject 2 Units into the skin 3 (three) times daily before meals.      .Marland Kitchen  insulin glargine (LANTUS) 100 UNIT/ML injection Inject 3 Units into the skin at bedtime.      . isosorbide mononitrate (IMDUR) 60 MG 24 hr tablet Take 60 mg by mouth daily. For HTN      . lisinopril (PRINIVIL,ZESTRIL) 20 MG tablet Take 20 mg by mouth at bedtime.      . metoprolol tartrate (LOPRESSOR) 12.5 mg TABS tablet Take 12.5 mg by mouth daily.      . nitroGLYCERIN (NITROSTAT) 0.4 MG SL tablet Place 0.4 mg under the tongue every 5 (five) minutes as needed for chest pain (May repeat for 3 doses as needed for chest pain. If chest pain persist call the MD.).      Marland Kitchen ondansetron (ZOFRAN) 4 MG tablet Take 4 mg by mouth every 8 (eight) hours as needed for nausea or vomiting.      . pantoprazole (PROTONIX) 40 MG tablet Take 40 mg by mouth 2 (two) times daily. For esophageal reflux      . saccharomyces boulardii (FLORASTOR) 250 MG capsule Take 250 mg by mouth 2 (two) times daily. For 14  Days        . sertraline (ZOLOFT) 100 MG tablet Take 100 mg by mouth daily. For mood       No current facility-administered medications on file prior to visit.     REVIEW OF SYSTEMS:  Positives indicated with an "X"  CARDIOVASCULAR:  [ ]  chest pain   [ ]  chest pressure   [ ]  palpitations   [ ]  orthopnea   [ ]  dyspnea on exertion   [ ]  claudication   [ ]  rest pain   [ ]  DVT   [ ]  phlebitis PULMONARY:   [ ]  productive cough   [ ]  asthma   [ ]  wheezing NEUROLOGIC:   [ ]  weakness  [ ]  paresthesias  [ ]  aphasia  [ ]  amaurosis  [ ]  dizziness HEMATOLOGIC:   [ ]  bleeding problems   [ ]  clotting disorders MUSCULOSKELETAL:  [ ]  joint pain   [ ]  joint swelling GASTROINTESTINAL: [ ]   blood in stool  [ ]   hematemesis GENITOURINARY:  [ ]   dysuria  [ ]   hematuria PSYCHIATRIC:  [ ]  history of major depression INTEGUMENTARY:  [ ]  rashes  [ ]  ulcers CONSTITUTIONAL:  [ ]  fever   [ ]  chills  PHYSICAL EXAMINATION:  General: The patient is a well-nourished male, in no acute distress. Vital signs are BP 177/84  Pulse 58  Resp 14  Ht 5\' 11"  (1.803 m)  Wt 160 lb 15 oz (73 kg)  BMI 22.46 kg/m2  SpO2 97% Pulmonary: There is a good air exchange bilaterally   Musculoskeletal: There are no major deformities.  There is no significant extremity pain. Neurologic: No focal weakness or paresthesias are detected, Skin: There are no ulcer or rashes noted. Psychiatric: The patient has normal affect. Cardiovascular: 2+ radial pulses bilaterally Surface veins reveal a nice diameter surface vein at the antecubital space and in the upper arm which is quite superficial on the left   V[facility] Vascular Lab Studies:  Ordered and Independently Reviewed aphasic radial and brachial waveforms bilaterally with normal pressures in the artery Duplex of the veins revealed patent cephalic vein and the left upper arm.  Impression and Plan:  End-stage renal disease with nonfunctional left Cimino radiocephalic AV fistula. Have recommended  left upper arm AV fistula creation. I imaged this with SonoSite ultrasound myself and he does have a very good  caliber at the antecubital level. I suspect that he is a very good chance that this will provide long-term access. We will schedule this when we can make arrangements with nursing facility for transportation for an outpatient left upper arm AV fistula creation    Emilynn Srinivasan [facility] Office: [phone]

## 2014-01-11 ENCOUNTER — Encounter: Payer: Self-pay | Admitting: Nephrology

## 2014-01-11 ENCOUNTER — Non-Acute Institutional Stay (SKILLED_NURSING_FACILITY): Payer: Medicare Other | Admitting: Family

## 2014-01-11 DIAGNOSIS — N186 End stage renal disease: Secondary | ICD-10-CM

## 2014-01-11 DIAGNOSIS — I10 Essential (primary) hypertension: Secondary | ICD-10-CM

## 2014-01-12 ENCOUNTER — Encounter (HOSPITAL_COMMUNITY): Payer: Self-pay | Admitting: *Deleted

## 2014-01-13 ENCOUNTER — Non-Acute Institutional Stay (SKILLED_NURSING_FACILITY): Payer: Medicare Other | Admitting: Internal Medicine

## 2014-01-13 DIAGNOSIS — I2589 Other forms of chronic ischemic heart disease: Secondary | ICD-10-CM

## 2014-01-13 DIAGNOSIS — I10 Essential (primary) hypertension: Secondary | ICD-10-CM

## 2014-01-13 DIAGNOSIS — I255 Ischemic cardiomyopathy: Secondary | ICD-10-CM

## 2014-01-13 DIAGNOSIS — T783XXA Angioneurotic edema, initial encounter: Secondary | ICD-10-CM

## 2014-01-15 MED ORDER — DEXTROSE 5 % IV SOLN
1.5000 g | INTRAVENOUS | Status: AC
  Administered 2014-01-16: 1.5 g via INTRAVENOUS
  Filled 2014-01-15: qty 1.5

## 2014-01-16 ENCOUNTER — Encounter (HOSPITAL_COMMUNITY): Payer: Medicare Other | Admitting: Anesthesiology

## 2014-01-16 ENCOUNTER — Ambulatory Visit (HOSPITAL_COMMUNITY)
Admission: RE | Admit: 2014-01-16 | Discharge: 2014-01-16 | Disposition: A | Discharge status: Skilled Nursing Facility | Payer: Medicare Other | Source: Ambulatory Visit | Attending: Vascular Surgery | Admitting: Vascular Surgery

## 2014-01-16 ENCOUNTER — Ambulatory Visit (HOSPITAL_COMMUNITY): Payer: Medicare Other

## 2014-01-16 ENCOUNTER — Encounter (HOSPITAL_COMMUNITY): Payer: Self-pay | Admitting: Anesthesiology

## 2014-01-16 ENCOUNTER — Ambulatory Visit (HOSPITAL_COMMUNITY): Payer: Medicare Other | Admitting: Anesthesiology

## 2014-01-16 ENCOUNTER — Encounter (HOSPITAL_COMMUNITY)
Admission: RE | Disposition: A | Discharge status: Skilled Nursing Facility | Payer: Self-pay | Source: Ambulatory Visit | Attending: Vascular Surgery

## 2014-01-16 DIAGNOSIS — I2510 Atherosclerotic heart disease of native coronary artery without angina pectoris: Secondary | ICD-10-CM | POA: Insufficient documentation

## 2014-01-16 DIAGNOSIS — I120 Hypertensive chronic kidney disease with stage 5 chronic kidney disease or end stage renal disease: Secondary | ICD-10-CM | POA: Diagnosis not present

## 2014-01-16 DIAGNOSIS — I5040 Unspecified combined systolic (congestive) and diastolic (congestive) heart failure: Secondary | ICD-10-CM | POA: Insufficient documentation

## 2014-01-16 DIAGNOSIS — N186 End stage renal disease: Secondary | ICD-10-CM

## 2014-01-16 DIAGNOSIS — E7800 Pure hypercholesterolemia, unspecified: Secondary | ICD-10-CM | POA: Insufficient documentation

## 2014-01-16 DIAGNOSIS — I428 Other cardiomyopathies: Secondary | ICD-10-CM | POA: Insufficient documentation

## 2014-01-16 DIAGNOSIS — Z7902 Long term (current) use of antithrombotics/antiplatelets: Secondary | ICD-10-CM | POA: Insufficient documentation

## 2014-01-16 DIAGNOSIS — E119 Type 2 diabetes mellitus without complications: Secondary | ICD-10-CM | POA: Insufficient documentation

## 2014-01-16 HISTORY — DX: Heart failure, unspecified: I50.9

## 2014-01-16 HISTORY — PX: AV FISTULA PLACEMENT: SHX1204

## 2014-01-16 LAB — POCT I-STAT 4, (NA,K, GLUC, HGB,HCT)
Glucose, Bld: 88 mg/dL (ref 70–99)
HCT: 36 % — ABNORMAL LOW (ref 39.0–52.0)
HEMOGLOBIN: 12.2 g/dL — AB (ref 13.0–17.0)
POTASSIUM: 4.5 meq/L (ref 3.7–5.3)
SODIUM: 139 meq/L (ref 137–147)

## 2014-01-16 LAB — GLUCOSE, CAPILLARY
Glucose-Capillary: 93 mg/dL (ref 70–99)
Glucose-Capillary: 97 mg/dL (ref 70–99)

## 2014-01-16 SURGERY — ARTERIOVENOUS (AV) FISTULA CREATION
Anesthesia: Monitor Anesthesia Care | Site: Arm Upper | Laterality: Left

## 2014-01-16 MED ORDER — DIPHENHYDRAMINE HCL 50 MG/ML IJ SOLN
12.5000 mg | Freq: Once | INTRAMUSCULAR | Status: AC
  Administered 2014-01-16: 12.5 mg via INTRAVENOUS

## 2014-01-16 MED ORDER — CHLORHEXIDINE GLUCONATE CLOTH 2 % EX PADS: 6.0000 | MEDICATED_PAD | Freq: Once | CUTANEOUS | Status: DC

## 2014-01-16 MED ORDER — LIDOCAINE-EPINEPHRINE 0.5 %-1:200000 IJ SOLN
INTRAMUSCULAR | Status: AC
  Filled 2014-01-16: qty 1

## 2014-01-16 MED ORDER — DIPHENHYDRAMINE HCL 50 MG/ML IJ SOLN
INTRAMUSCULAR | Status: AC
  Administered 2014-01-16: 12.5 mg via INTRAVENOUS
  Filled 2014-01-16: qty 1

## 2014-01-16 MED ORDER — FENTANYL CITRATE 0.05 MG/ML IJ SOLN
INTRAMUSCULAR | Status: DC | PRN
  Administered 2014-01-16 (×2): 50 ug via INTRAVENOUS

## 2014-01-16 MED ORDER — PROPOFOL 10 MG/ML IV BOLUS
INTRAVENOUS | Status: AC
  Filled 2014-01-16: qty 20

## 2014-01-16 MED ORDER — SODIUM CHLORIDE 0.9 % IV SOLN
INTRAVENOUS | Status: DC | PRN
  Administered 2014-01-16: 12:00:00 via INTRAVENOUS

## 2014-01-16 MED ORDER — LIDOCAINE-EPINEPHRINE 0.5 %-1:200000 IJ SOLN
INTRAMUSCULAR | Status: DC | PRN
  Administered 2014-01-16: 50 mL

## 2014-01-16 MED ORDER — OXYCODONE-ACETAMINOPHEN 5-325 MG PO TABS: 1.0000 | ORAL_TABLET | Freq: Four times a day (QID) | ORAL | Status: DC | PRN

## 2014-01-16 MED ORDER — FENTANYL CITRATE 0.05 MG/ML IJ SOLN
INTRAMUSCULAR | Status: AC
  Filled 2014-01-16: qty 5

## 2014-01-16 MED ORDER — PROPOFOL INFUSION 10 MG/ML OPTIME
INTRAVENOUS | Status: DC | PRN
  Administered 2014-01-16: 50 ug/kg/min via INTRAVENOUS

## 2014-01-16 MED ORDER — SODIUM CHLORIDE 0.9 % IR SOLN
Status: DC | PRN
  Administered 2014-01-16: 12:00:00

## 2014-01-16 MED ORDER — SODIUM CHLORIDE 0.9 % IV SOLN
INTRAVENOUS | Status: DC
  Administered 2014-01-16: 09:00:00 via INTRAVENOUS

## 2014-01-16 MED ORDER — 0.9 % SODIUM CHLORIDE (POUR BTL) OPTIME
TOPICAL | Status: DC | PRN
  Administered 2014-01-16: 1000 mL

## 2014-01-16 SURGICAL SUPPLY — 44 items
ARMBAND PINK RESTRICT EXTREMIT (MISCELLANEOUS) ×3 IMPLANT
BENZOIN TINCTURE PRP APPL 2/3 (GAUZE/BANDAGES/DRESSINGS) ×3 IMPLANT
CANISTER SUCTION 2500CC (MISCELLANEOUS) ×3 IMPLANT
CLIP LIGATING EXTRA MED SLVR (CLIP) ×3 IMPLANT
CLIP LIGATING EXTRA SM BLUE (MISCELLANEOUS) ×3 IMPLANT
CLOSURE WOUND 1/2 X4 (GAUZE/BANDAGES/DRESSINGS) ×1
COVER PROBE W GEL 5X96 (DRAPES) ×3 IMPLANT
COVER SURGICAL LIGHT HANDLE (MISCELLANEOUS) ×3 IMPLANT
DECANTER SPIKE VIAL GLASS SM (MISCELLANEOUS) ×3 IMPLANT
ELECT REM PT RETURN 9FT ADLT (ELECTROSURGICAL) ×3
ELECTRODE REM PT RTRN 9FT ADLT (ELECTROSURGICAL) ×1 IMPLANT
GEL ULTRASOUND 20GR AQUASONIC (MISCELLANEOUS) IMPLANT
GLOVE BIOGEL PI IND STRL 6.5 (GLOVE) ×2 IMPLANT
GLOVE BIOGEL PI IND STRL 7.0 (GLOVE) ×1 IMPLANT
GLOVE BIOGEL PI IND STRL 7.5 (GLOVE) ×1 IMPLANT
GLOVE BIOGEL PI INDICATOR 6.5 (GLOVE) ×4
GLOVE BIOGEL PI INDICATOR 7.0 (GLOVE) ×2
GLOVE BIOGEL PI INDICATOR 7.5 (GLOVE) ×2
GLOVE ECLIPSE 6.5 STRL STRAW (GLOVE) ×6 IMPLANT
GLOVE ECLIPSE 7.5 STRL STRAW (GLOVE) ×3 IMPLANT
GLOVE SS BIOGEL STRL SZ 7 (GLOVE) ×1 IMPLANT
GLOVE SS BIOGEL STRL SZ 7.5 (GLOVE) ×1 IMPLANT
GLOVE SUPERSENSE BIOGEL SZ 7 (GLOVE) ×2
GLOVE SUPERSENSE BIOGEL SZ 7.5 (GLOVE) ×2
GOWN STRL REUS W/ TWL LRG LVL3 (GOWN DISPOSABLE) ×4 IMPLANT
GOWN STRL REUS W/ TWL XL LVL3 (GOWN DISPOSABLE) ×1 IMPLANT
GOWN STRL REUS W/TWL LRG LVL3 (GOWN DISPOSABLE) ×8
GOWN STRL REUS W/TWL XL LVL3 (GOWN DISPOSABLE) ×2
KIT BASIN OR (CUSTOM PROCEDURE TRAY) ×3 IMPLANT
KIT ROOM TURNOVER OR (KITS) ×3 IMPLANT
NEEDLE HYPO 25GX1X1/2 BEV (NEEDLE) ×3 IMPLANT
NS IRRIG 1000ML POUR BTL (IV SOLUTION) ×3 IMPLANT
PACK CV ACCESS (CUSTOM PROCEDURE TRAY) ×3 IMPLANT
PAD ARMBOARD 7.5X6 YLW CONV (MISCELLANEOUS) ×6 IMPLANT
SPONGE GAUZE 4X4 12PLY (GAUZE/BANDAGES/DRESSINGS) ×3 IMPLANT
STRIP CLOSURE SKIN 1/2X4 (GAUZE/BANDAGES/DRESSINGS) ×2 IMPLANT
SUT PROLENE 6 0 CC (SUTURE) ×3 IMPLANT
SUT VIC AB 3-0 SH 27 (SUTURE) ×2
SUT VIC AB 3-0 SH 27X BRD (SUTURE) ×1 IMPLANT
TAPE CLOTH SURG 4X10 WHT LF (GAUZE/BANDAGES/DRESSINGS) ×3 IMPLANT
TOWEL OR 17X24 6PK STRL BLUE (TOWEL DISPOSABLE) ×3 IMPLANT
TOWEL OR 17X26 10 PK STRL BLUE (TOWEL DISPOSABLE) ×3 IMPLANT
UNDERPAD 30X30 INCONTINENT (UNDERPADS AND DIAPERS) ×3 IMPLANT
WATER STERILE IRR 1000ML POUR (IV SOLUTION) ×3 IMPLANT

## 2014-01-16 NOTE — Progress Notes (Signed)
Pt was given two doses of benadryl for itching, slight decrease in rash noted, no resp distress ever noted, Dr.Fitgerald ok for pt to be discharged back to [facility] with Luanna Salk at [facility] and report was gtiven, aware of possible allergic reaction to Zinacef, dc instructions given and sent with patient, IV removed, pt tol/well, waiting for ride to return to [facility].

## 2014-01-16 NOTE — Interval H&P Note (Signed)
History and Physical Interval Note:  01/16/2014 11:06 AM  Albert Grant  has presented today for surgery, with the diagnosis of ESRD  The various methods of treatment have been discussed with the patient and family. After consideration of risks, benefits and other options for treatment, the patient has consented to  Procedure(s): ARTERIOVENOUS (AV) FISTULA CREATION- LEFT UPPER ARM  (Left) as a surgical intervention .  The patient's history has been reviewed, patient examined, no change in status, stable for surgery.  I have reviewed the patient's chart and labs.  Questions were answered to the patient's satisfaction.     EARLY, TODD

## 2014-01-16 NOTE — Progress Notes (Signed)
[facility] contacted regarding pt. Medication list reviewed and other health hx clarified.

## 2014-01-16 NOTE — Progress Notes (Signed)
Patient ID: Albert Grant, male   DOB: [birth year], 73 y.o.   MRN: [id number]                  PROGRESS NOTE  DATE:  01/13/2014    FACILITY: [facility]    LEVEL OF CARE:   SNF   Acute Visit   CHIEF COMPLAINT:  Hypertension, generalized pruritus.    HISTORY OF PRESENT ILLNESS:  Albert Grant is a gentleman with end-stage renal disease, on dialysis.  Albert Grant is a type 2 diabetic.    Albert Grant also has a cardiomyopathy, hypertension, and a history of coronary artery disease.    Recent issues include generalized pruritus and angioedema.  Albert Grant was seen by our service on 12/12/2013 and it was felt this may be Albert Grant lisinopril, which was discontinued.    Albert Grant also seems to be on a combination of metoprolol and Coreg.  The rationale here has really been unclear.  Albert Grant metoprolol was discontinued on 01/11/2014 and we received a call from Dialysis that Albert Grant blood pressure was 214/111.  At the time, Albert Grant was given clonidine orally.  By the time Albert Grant got back to the facility, Albert Grant blood pressure was 180/90.    Reviewing Albert Grant CBGs, they are variable in the morning:  Yesterday 213, this morning 89.  Albert Grant is generally in the high 100s/low 200s a.c. lunch, mostly well over 200 a.c. dinner, and sometimes close to 300 at h.s.  Currently, Albert Grant is receiving 8 U of Lantus, and 2 U of NovoLog before meals.    PHYSICAL EXAMINATION:   GENERAL APPEARANCE:  The patient is not in any distress.   HEENT:   HEAD:  Albert Grant still has what appears to be facial swelling and/or buccal/oral swelling, although I have not seen him before.   SKIN:  INSPECTION:  Albert Grant has leathery skin on Albert Grant arms with excoriations, lesser degrees of this on Albert Grant chest. CARDIOVASCULAR:  CARDIAC:   Heart sounds are normal.  There are no murmurs.  No bruits.   CHEST/RESPIRATORY:  Clear.    ASSESSMENT/PLAN:  Uncontrolled hypertension.  Albert Grant is on Coreg 25 b.i.d., hydralazine 50 mg three times a day.  I think we could increase the hydralazine.   I do not see any point in having  him on metoprolol, as well.    Ischemic cardiomyopathy.  Albert Grant is on Imdur 60.    Type 2 diabetes with nephropathy.   I am going to increase Albert Grant Lantus gently to 12 U subcu q.h.s.  After we can get control of the morning sugars, consider increasing the NovoLog.    Generalized pruritus.  This happens in patients with chronic renal failure, especially those on dialysis.  However, Albert Grant has thickened, excoriated skin on Albert Grant arms which Albert Grant scratches continuously.  I will try to give him a lubricating cream with light steroid.     CPT CODE: 57897

## 2014-01-16 NOTE — Op Note (Signed)
    OPERATIVE REPORT  DATE OF SURGERY: 01/16/2014  PATIENT: Albert Grant, 73 y.o. male MRN: [id number]  DOB: [birth year]  PRE-OPERATIVE DIAGNOSIS: End-stage renal disease  POST-OPERATIVE DIAGNOSIS:  Same  PROCEDURE: Left upper arm AV fistula creation  SURGEON:  Gretta Began, M.D.  PHYSICIAN ASSISTANT: Collins  ANESTHESIA:  Local with sedation  EBL: Minimal ml  Total I/O In: 400 [I.V.:400] Out: 10 [Blood:10]  BLOOD ADMINISTERED: None  DRAINS: None  SPECIMEN: None  COUNTS CORRECT:  YES  PLAN OF CARE: PACU   PATIENT DISPOSITION:  PACU - hemodynamically stable  PROCEDURE DETAILS: The patient was taken up from placed supine position where the left arm was prepped and draped in sterile fashion. SonoSite ultrasound was used to visualize the cephalic vein which was excellent caliber at the antecubital space in the upper arm. Using local anesthesia incision was made transversely at the antecubital space. Isolate the brachial artery which was of large caliber with minimal atherosclerotic change. The cephalic vein was also exposed. The vein was mobilized proximally and distally and tributary branches were ligated with 30 and 4 surpassed divided. The vein divided below the antecubital space was mobilized to the level of the brachial artery. The brachial artery was occluded proximal and distally was opened 11 blade sludge in Potts scissors. The vein was slightly spatulated and sewn end-to-side to the artery with a running 6-0 Prolene suture. Prior to completion of the anastomosis the usual flushing maneuvers were undertaken. As was completed and flow restored with excellent thrill noted. Wound irrigated with saline. Hemostasis obtained with cautery. The wound was closed with 3-0 Vicryl in the subcutaneous and subcuticular tissue. Benzoin and Steri-Strips were applied. The patient was taken to the recovery room in stable condition   Gretta Began, M.D. 01/16/2014 2:08 PM

## 2014-01-16 NOTE — Anesthesia Postprocedure Evaluation (Signed)
  Anesthesia Post-op Note  Patient: Albert Grant  Procedure(s) Performed: Procedure(s): ARTERIOVENOUS (AV) FISTULA CREATION; ULTRASOUND GUIDED (Left)  Patient Location: PACU  Anesthesia Type:General  Level of Consciousness: awake and alert   Airway and Oxygen Therapy: Patient Spontanous Breathing  Post-op Pain: none  Post-op Assessment: Post-op Vital signs reviewed, Patient's Cardiovascular Status Stable and Respiratory Function Stable  Post-op Vital Signs: Reviewed  Filed Vitals:   01/16/14 1415  BP: 188/86  Pulse: 71  Temp:   Resp: 23    Complications: No apparent anesthesia complications

## 2014-01-16 NOTE — Anesthesia Preprocedure Evaluation (Addendum)
Anesthesia Evaluation  Patient identified by MRN, date of birth, ID band Patient awake    Reviewed: Allergy & Precautions, H&P , NPO status , Patient's Chart, lab work & pertinent test results, reviewed documented beta blocker date and time   Airway Mallampati: III TM Distance: >3 FB Neck ROM: Full    Dental no notable dental hx. (+) Teeth Intact, Dental Advisory Given   Pulmonary neg pulmonary ROS,  breath sounds clear to auscultation  Pulmonary exam normal       Cardiovascular hypertension, On Medications and On Home Beta Blockers + CAD and +CHF Rhythm:Regular Rate:Normal     Neuro/Psych negative neurological ROS  negative psych ROS   GI/Hepatic negative GI ROS, Neg liver ROS,   Endo/Other  diabetes, Type 1, Insulin Dependent  Renal/GU ESRF and DialysisRenal disease  negative genitourinary   Musculoskeletal   Abdominal   Peds  Hematology negative hematology ROS (+)   Anesthesia Other Findings   Reproductive/Obstetrics negative OB ROS                          Anesthesia Physical Anesthesia Plan  ASA: III  Anesthesia Plan: MAC   Post-op Pain Management:    Induction: Intravenous  Airway Management Planned: Simple Face Mask  Additional Equipment:   Intra-op Plan:   Post-operative Plan:   Informed Consent: I have reviewed the patients History and Physical, chart, labs and discussed the procedure including the risks, benefits and alternatives for the proposed anesthesia with the patient or authorized representative who has indicated his/her understanding and acceptance.   Dental advisory given  Plan Discussed with: CRNA  Anesthesia Plan Comments:         Anesthesia Quick Evaluation

## 2014-01-16 NOTE — H&P (View-Only) (Signed)
Patient name: Albert HaberRene Paget MRN: [id number] DOB: 03/29/1941 Sex: male   Referred by: Juel BurrowLin  Reason for referral:  Chief Complaint  Patient presents with  . Re-evaluation    Pt. needs new access old It cimino " doubt beyond salvage"  Fistulogram  Dr. Juel BurrowLin    HISTORY OF PRESENT ILLNESS: Patient is a 73 year old gentleman with end-stage renal disease currently being dialyzed via a right IJ hemodialysis catheter. Apparently he had a left Cimino fistula placed that it due to several years ago. A physical exam this is very sclerotic and unusable. I also have reports that I reviewed from a recent shuntogram with Dr.Lin. This also shows a sclerotic unusable forearm fistula. He is here for discussion of additional new access placement. He dialyzes on Tuesday Thursday and Saturday  Past Medical History  Diagnosis Date  . 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 Hypertension   . CAD (coronary artery disease)      with angioplasty  . 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 Cardiomyopathy     with ejection fraction of 45 %  . Heart failure, systolic and diastolic   . Anemia     Past Surgical History  Procedure Laterality Date  . Spine surgery      spinal fusion    History   Social History  . Marital Status: Married    Spouse Name: N/A    Number of Children: N/A  . Years of Education: N/A   Occupational History  . Not on file.   Social History Main Topics  . Smoking status: Never Smoker   . Smokeless tobacco: Never Used  . Alcohol Use: No  . Drug Use: No  . Sexual Activity: Not on file   Other Topics Concern  . Not on file   Social History Narrative  . No narrative on file    History reviewed. No pertinent family history.  Allergies as of 01/10/2014 - Review Complete 01/10/2014  Allergen Reaction Noted  . Lisinopril  12/12/2013    Current Outpatient Prescriptions on File Prior to Visit  Medication Sig Dispense Refill  . Amino  Acids-Protein Hydrolys (FEEDING SUPPLEMENT, PRO-STAT SUGAR FREE 64,) LIQD Take 30 mLs by mouth 2 (two) times daily.      Marland Kitchen. atorvastatin (LIPITOR) 80 MG tablet Take 80 mg by mouth at bedtime. For hypercholesterolemia      . B Complex-C (SUPER B COMPLEX) TABS Take by mouth daily.      . calcium acetate (PHOSLO) 667 MG capsule Take 667 mg by mouth 3 (three) times daily with meals.      . carvedilol (COREG) 25 MG tablet Take 25 mg by mouth 2 (two) times daily with a meal. For HTN      . clopidogrel (PLAVIX) 75 MG tablet Take 75 mg by mouth daily with breakfast.      . ferrous sulfate 325 (65 FE) MG tablet Take 325 mg by mouth 3 (three) times daily with meals.      Marland Kitchen. guaifenesin (ROBITUSSIN CHEST CONGESTION) 100 MG/5ML syrup Take 200 mg by mouth every 4 (four) hours as needed for cough. For cough      . hydrALAZINE (APRESOLINE) 50 MG tablet Take 50 mg by mouth 3 (three) times daily. For HTN      . insulin aspart (NOVOLOG) 100 UNIT/ML injection Inject 2 Units into the skin 3 (three) times daily before meals.      .Marland Kitchen  insulin glargine (LANTUS) 100 UNIT/ML injection Inject 3 Units into the skin at bedtime.      . isosorbide mononitrate (IMDUR) 60 MG 24 hr tablet Take 60 mg by mouth daily. For HTN      . lisinopril (PRINIVIL,ZESTRIL) 20 MG tablet Take 20 mg by mouth at bedtime.      . metoprolol tartrate (LOPRESSOR) 12.5 mg TABS tablet Take 12.5 mg by mouth daily.      . nitroGLYCERIN (NITROSTAT) 0.4 MG SL tablet Place 0.4 mg under the tongue every 5 (five) minutes as needed for chest pain (May repeat for 3 doses as needed for chest pain. If chest pain persist call the MD.).      Marland Kitchen ondansetron (ZOFRAN) 4 MG tablet Take 4 mg by mouth every 8 (eight) hours as needed for nausea or vomiting.      . pantoprazole (PROTONIX) 40 MG tablet Take 40 mg by mouth 2 (two) times daily. For esophageal reflux      . saccharomyces boulardii (FLORASTOR) 250 MG capsule Take 250 mg by mouth 2 (two) times daily. For 14  Days        . sertraline (ZOLOFT) 100 MG tablet Take 100 mg by mouth daily. For mood       No current facility-administered medications on file prior to visit.     REVIEW OF SYSTEMS:  Positives indicated with an "X"  CARDIOVASCULAR:  [ ]  chest pain   [ ]  chest pressure   [ ]  palpitations   [ ]  orthopnea   [ ]  dyspnea on exertion   [ ]  claudication   [ ]  rest pain   [ ]  DVT   [ ]  phlebitis PULMONARY:   [ ]  productive cough   [ ]  asthma   [ ]  wheezing NEUROLOGIC:   [ ]  weakness  [ ]  paresthesias  [ ]  aphasia  [ ]  amaurosis  [ ]  dizziness HEMATOLOGIC:   [ ]  bleeding problems   [ ]  clotting disorders MUSCULOSKELETAL:  [ ]  joint pain   [ ]  joint swelling GASTROINTESTINAL: [ ]   blood in stool  [ ]   hematemesis GENITOURINARY:  [ ]   dysuria  [ ]   hematuria PSYCHIATRIC:  [ ]  history of major depression INTEGUMENTARY:  [ ]  rashes  [ ]  ulcers CONSTITUTIONAL:  [ ]  fever   [ ]  chills  PHYSICAL EXAMINATION:  General: The patient is a well-nourished male, in no acute distress. Vital signs are BP 177/84  Pulse 58  Resp 14  Ht 5\' 11"  (1.803 m)  Wt 160 lb 15 oz (73 kg)  BMI 22.46 kg/m2  SpO2 97% Pulmonary: There is a good air exchange bilaterally   Musculoskeletal: There are no major deformities.  There is no significant extremity pain. Neurologic: No focal weakness or paresthesias are detected, Skin: There are no ulcer or rashes noted. Psychiatric: The patient has normal affect. Cardiovascular: 2+ radial pulses bilaterally Surface veins reveal a nice diameter surface vein at the antecubital space and in the upper arm which is quite superficial on the left   [facility] Vascular Lab Studies:  Ordered and Independently Reviewed aphasic radial and brachial waveforms bilaterally with normal pressures in the artery Duplex of the veins revealed patent cephalic vein and the left upper arm.  Impression and Plan:  End-stage renal disease with nonfunctional left Cimino radiocephalic AV fistula. Have recommended  left upper arm AV fistula creation. I imaged this with SonoSite ultrasound myself and he does have a very good  caliber at the antecubital level. I suspect that he is a very good chance that this will provide long-term access. We will schedule this when we can make arrangements with nursing facility for transportation for an outpatient left upper arm AV fistula creation    Samyukta Cura [facility] Office: [phone]

## 2014-01-16 NOTE — Preoperative (Signed)
Beta Blockers   Reason not to administer Beta Blockers:Coreg this morning 

## 2014-01-16 NOTE — Transfer of Care (Signed)
Immediate Anesthesia Transfer of Care Note  Patient: Albert Grant  Procedure(s) Performed: Procedure(s): ARTERIOVENOUS (AV) FISTULA CREATION; ULTRASOUND GUIDED (Left)  Patient Location: PACU  Anesthesia Type:MAC  Level of Consciousness: awake, alert  and patient cooperative  Airway & Oxygen Therapy: Patient Spontanous Breathing and Patient connected to face mask oxygen  Post-op Assessment: Report given to PACU RN, Post -op Vital signs reviewed and stable and Patient moving all extremities  Post vital signs: Reviewed and stable  Complications: No apparent anesthesia complications

## 2014-01-17 ENCOUNTER — Telehealth: Payer: Self-pay | Admitting: Vascular Surgery

## 2014-01-17 ENCOUNTER — Encounter (HOSPITAL_COMMUNITY): Payer: Self-pay | Admitting: Vascular Surgery

## 2014-01-17 NOTE — Telephone Encounter (Addendum)
Message copied by Fredrich Birks on Tue Jan 17, 2014  1:41 PM ------      Message from: Lorin Mercy K      Created: Mon Jan 16, 2014  1:23 PM      Regarding: Schedule                   ----- Message -----         From: Lars Mage, PA-C         Sent: 01/16/2014  12:43 PM           To: [facility] Charge Pool            F/U in 4 weeks s/p AV Fistula Dr. Arbie Cookey no ultrasound needed. ------  01/17/14: lm with [facility] RN, dpm

## 2014-02-01 ENCOUNTER — Encounter: Payer: Self-pay | Admitting: Family

## 2014-02-01 NOTE — Progress Notes (Signed)
Patient ID: Albert Grant, male   DOB: 12-Jul-1941, 73 y.o.   MRN: [id number]  Date: 01/11/14 Facility: [facility]  Code Status:  Full Code  Chief Complaint  Patient presents with  . Acute Visit    Medication Management    HPI: Pt being managed for chronic illnesses. Pt was taken off lisinopril d/t angioedema, then, subsequently started on Lopressor. Pt's MAR reveals he's also taking another beta-blocker, Coreg. Pt denies syncope, HA, dizziness, fatigue, N/V/D, fever, chills.  Health care team denies episodes of hypotension since the initiation of Lopressor. No further issues/concerns expressed at present time.        Allergies  Allergen Reactions  . Lisinopril Other (See Comments)    Per MAR     Medication List       This list is accurate as of: 01/11/14 11:59 PM.  Always use your most recent med list.               atorvastatin 80 MG tablet  Commonly known as:  LIPITOR  Take 80 mg by mouth at bedtime. For hypercholesterolemia     carvedilol 25 MG tablet  Commonly known as:  COREG  Take 25 mg by mouth 2 (two) times daily with a meal. For HTN (10am & 10pm)     clopidogrel 75 MG tablet  Commonly known as:  PLAVIX  Take 75 mg by mouth daily.     feeding supplement (PRO-STAT SUGAR FREE 64) Liqd  Take 30 mLs by mouth 2 (two) times daily. 10am and 10pm     ferrous sulfate 325 (65 FE) MG tablet  Take 325 mg by mouth 3 (three) times daily with meals. 10am, 2pm, 10pm     guaifenesin 100 MG/5ML syrup  Commonly known as:  ROBITUSSIN  Take 200 mg by mouth every 4 (four) hours as needed for cough.     hydrALAZINE 50 MG tablet  Commonly known as:  APRESOLINE  Take 50 mg by mouth 3 (three) times daily. For HTN (10am, 2pm, 10pm)     insulin aspart 100 UNIT/ML injection  Commonly known as:  novoLOG  Inject 2 Units into the skin 3 (three) times daily before meals.     insulin glargine 100 UNIT/ML injection  Commonly known as:  LANTUS  Inject 8 Units into the skin at  bedtime.     isosorbide mononitrate 60 MG 24 hr tablet  Commonly known as:  IMDUR  Take 60 mg by mouth daily. For HTN     nitroGLYCERIN 0.4 MG SL tablet  Commonly known as:  NITROSTAT  Place 0.4 mg under the tongue every 5 (five) minutes as needed for chest pain (May repeat for 3 doses as needed for chest pain. If chest pain persist call the MD.).     ondansetron 4 MG tablet  Commonly known as:  ZOFRAN  Take 4 mg by mouth every 8 (eight) hours as needed for nausea or vomiting.     oxyCODONE-acetaminophen 5-325 MG per tablet  Commonly known as:  PERCOCET/ROXICET  Take 1 tablet by mouth every 6 (six) hours as needed.     pantoprazole 40 MG tablet  Commonly known as:  PROTONIX  Take 40 mg by mouth 2 (two) times daily. For esophageal reflux (6:30am & 4:30pm)     PHOSLO 667 MG capsule  Generic drug:  calcium acetate  Take 667-2,001 mg by mouth 4 (four) times daily - after meals and at bedtime. Take 3 capsules (2001 mg) 3  times daily with meals, take 1 capsule (667 mg) every night at bedtime with snack     PRESCRIPTION MEDICATION  Take 1 packet by mouth daily. Novosource (supplement)     PRESCRIPTION MEDICATION  Apply 1 mg topically 2 (two) times daily as needed (anxiety/agitation). Lorazepam 1 mg/ml gel     sertraline 100 MG tablet  Commonly known as:  ZOLOFT  Take 100 mg by mouth daily. For mood     SUPER B COMPLEX Tabs  Take 1 tablet by mouth daily.         DATA REVIEWED  Laboratory Studies: Reviewed     Past Medical History  Diagnosis Date  . 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 Hypertension   . CAD (coronary artery disease)      with angioplasty  . 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 Cardiomyopathy     with ejection fraction of 45 %  . Heart failure, systolic and diastolic   . Anemia   . CHF (congestive heart failure)    Past Surgical History  Procedure Laterality Date  . Spine surgery      spinal fusion  . Av fistula  placement Left 01/16/2014    Procedure: ARTERIOVENOUS (AV) FISTULA CREATION; ULTRASOUND GUIDED;  Surgeon: Larina Earthlyodd F Early, MD;  [facility];  Service: Vascular;  Laterality: Left;     Review of Systems  Constitutional: Negative.   Respiratory: Negative.   Cardiovascular: Negative.   Neurological: Negative.     Physical Exam Filed Vitals:   01/11/14 1353  BP: 138/78  Pulse: 88  Temp: 97.1 F (36.2 C)  Resp: 20   There is no weight on file to calculate BMI. Physical Exam  Constitutional: He is oriented to person, place, and time.  Cardiovascular: Normal rate and regular rhythm.   Pulmonary/Chest: Effort normal and breath sounds normal.  Genitourinary:  ESRD-Dialysis  Neurological: He is alert and oriented to person, place, and time.    ASSESSMENT/PLAN  D/C Metoprolol  Maintain B/P log for 7 days V/S q shift x 7 days   Follow up:prn

## 2014-02-13 ENCOUNTER — Encounter: Payer: Self-pay | Admitting: Vascular Surgery

## 2014-02-14 ENCOUNTER — Encounter: Payer: Medicare Other | Admitting: Vascular Surgery

## 2014-02-23 ENCOUNTER — Inpatient Hospital Stay: Payer: Self-pay | Admitting: Family Medicine

## 2014-02-23 LAB — COMPREHENSIVE METABOLIC PANEL
ALBUMIN: 3.1 g/dL — AB (ref 3.4–5.0)
ALK PHOS: 147 U/L — AB
AST: 37 U/L (ref 15–37)
Anion Gap: 10 (ref 7–16)
BILIRUBIN TOTAL: 0.6 mg/dL (ref 0.2–1.0)
BUN: 59 mg/dL — ABNORMAL HIGH (ref 7–18)
CALCIUM: 8.1 mg/dL — AB (ref 8.5–10.1)
CO2: 25 mmol/L (ref 21–32)
Chloride: 98 mmol/L (ref 98–107)
Creatinine: 6.47 mg/dL — ABNORMAL HIGH (ref 0.60–1.30)
GFR CALC AF AMER: 9 — AB
GFR CALC NON AF AMER: 8 — AB
GLUCOSE: 120 mg/dL — AB (ref 65–99)
OSMOLALITY: 284 (ref 275–301)
POTASSIUM: 4.6 mmol/L (ref 3.5–5.1)
SGPT (ALT): 31 U/L (ref 12–78)
Sodium: 133 mmol/L — ABNORMAL LOW (ref 136–145)
TOTAL PROTEIN: 7.5 g/dL (ref 6.4–8.2)

## 2014-02-23 LAB — CBC WITH DIFFERENTIAL/PLATELET
Basophil #: 0 10*3/uL (ref 0.0–0.1)
Basophil %: 0 %
Eosinophil #: 0.2 10*3/uL (ref 0.0–0.7)
Eosinophil %: 4.4 %
HCT: 42 % (ref 40.0–52.0)
HGB: 13.8 g/dL (ref 13.0–18.0)
LYMPHS PCT: 3.4 %
Lymphocyte #: 0.2 10*3/uL — ABNORMAL LOW (ref 1.0–3.6)
MCH: 32.3 pg (ref 26.0–34.0)
MCHC: 32.8 g/dL (ref 32.0–36.0)
MCV: 98 fL (ref 80–100)
MONO ABS: 0.1 x10 3/mm — AB (ref 0.2–1.0)
MONOS PCT: 0.9 %
NEUTROS PCT: 91.3 %
Neutrophil #: 4.9 10*3/uL (ref 1.4–6.5)
Platelet: 234 10*3/uL (ref 150–440)
RBC: 4.27 10*6/uL — AB (ref 4.40–5.90)
RDW: 15.3 % — ABNORMAL HIGH (ref 11.5–14.5)
WBC: 5.4 10*3/uL (ref 3.8–10.6)

## 2014-02-23 LAB — URINALYSIS, COMPLETE
Bilirubin,UR: NEGATIVE
Ketone: NEGATIVE
Leukocyte Esterase: NEGATIVE
Nitrite: NEGATIVE
Ph: 6 (ref 4.5–8.0)
SPECIFIC GRAVITY: 1.013 (ref 1.003–1.030)
Squamous Epithelial: <1

## 2014-02-23 LAB — APTT: Activated PTT: 30.2 secs (ref 23.6–35.9)

## 2014-02-23 LAB — PHOSPHORUS: Phosphorus: 5 mg/dL — ABNORMAL HIGH (ref 2.5–4.9)

## 2014-02-23 LAB — PROTIME-INR
INR: 1.1
Prothrombin Time: 13.8 secs (ref 11.5–14.7)

## 2014-02-23 LAB — TROPONIN I: Troponin-I: 0.04 ng/mL

## 2014-02-23 LAB — MAGNESIUM: MAGNESIUM: 1.9 mg/dL

## 2014-02-24 LAB — CBC WITH DIFFERENTIAL/PLATELET
BASOS ABS: 0.1 10*3/uL (ref 0.0–0.1)
Basophil %: 0.7 %
EOS PCT: 4.4 %
Eosinophil #: 0.5 10*3/uL (ref 0.0–0.7)
HCT: 33.9 % — ABNORMAL LOW (ref 40.0–52.0)
HGB: 11 g/dL — ABNORMAL LOW (ref 13.0–18.0)
Lymphocyte #: 0.5 10*3/uL — ABNORMAL LOW (ref 1.0–3.6)
Lymphocyte %: 4.1 %
MCH: 31.7 pg (ref 26.0–34.0)
MCHC: 32.4 g/dL (ref 32.0–36.0)
MCV: 98 fL (ref 80–100)
MONOS PCT: 7.3 %
Monocyte #: 0.9 x10 3/mm (ref 0.2–1.0)
NEUTROS ABS: 10.3 10*3/uL — AB (ref 1.4–6.5)
Neutrophil %: 83.5 %
PLATELETS: 185 10*3/uL (ref 150–440)
RBC: 3.47 10*6/uL — ABNORMAL LOW (ref 4.40–5.90)
RDW: 15.8 % — AB (ref 11.5–14.5)
WBC: 12.3 10*3/uL — ABNORMAL HIGH (ref 3.8–10.6)

## 2014-02-24 LAB — COMPREHENSIVE METABOLIC PANEL
ALK PHOS: 103 U/L
AST: 21 U/L (ref 15–37)
Albumin: 2.4 g/dL — ABNORMAL LOW (ref 3.4–5.0)
Anion Gap: 8 (ref 7–16)
BILIRUBIN TOTAL: 0.4 mg/dL (ref 0.2–1.0)
BUN: 68 mg/dL — ABNORMAL HIGH (ref 7–18)
CALCIUM: 7.3 mg/dL — AB (ref 8.5–10.1)
CHLORIDE: 102 mmol/L (ref 98–107)
Co2: 24 mmol/L (ref 21–32)
Creatinine: 7.68 mg/dL — ABNORMAL HIGH (ref 0.60–1.30)
EGFR (African American): 7 — ABNORMAL LOW
GFR CALC NON AF AMER: 6 — AB
Glucose: 122 mg/dL — ABNORMAL HIGH (ref 65–99)
OSMOLALITY: 289 (ref 275–301)
Potassium: 4.2 mmol/L (ref 3.5–5.1)
SGPT (ALT): 20 U/L (ref 12–78)
SODIUM: 134 mmol/L — AB (ref 136–145)
Total Protein: 5.9 g/dL — ABNORMAL LOW (ref 6.4–8.2)

## 2014-02-24 LAB — TSH: Thyroid Stimulating Horm: 1.7 u[IU]/mL

## 2014-02-25 LAB — CBC WITH DIFFERENTIAL/PLATELET
BASOS PCT: 1 %
Basophil #: 0.1 10*3/uL (ref 0.0–0.1)
Eosinophil #: 0.5 10*3/uL (ref 0.0–0.7)
Eosinophil %: 8.5 %
HCT: 35.5 % — AB (ref 40.0–52.0)
HGB: 11.4 g/dL — ABNORMAL LOW (ref 13.0–18.0)
LYMPHS ABS: 0.6 10*3/uL — AB (ref 1.0–3.6)
Lymphocyte %: 9.5 %
MCH: 31.3 pg (ref 26.0–34.0)
MCHC: 32 g/dL (ref 32.0–36.0)
MCV: 98 fL (ref 80–100)
MONOS PCT: 11.8 %
Monocyte #: 0.8 x10 3/mm (ref 0.2–1.0)
NEUTROS PCT: 69.2 %
Neutrophil #: 4.4 10*3/uL (ref 1.4–6.5)
Platelet: 198 10*3/uL (ref 150–440)
RBC: 3.63 10*6/uL — ABNORMAL LOW (ref 4.40–5.90)
RDW: 15.5 % — AB (ref 11.5–14.5)
WBC: 6.4 10*3/uL (ref 3.8–10.6)

## 2014-02-25 LAB — BASIC METABOLIC PANEL
Anion Gap: 15 (ref 7–16)
BUN: 79 mg/dL — AB (ref 7–18)
CALCIUM: 7.9 mg/dL — AB (ref 8.5–10.1)
CO2: 22 mmol/L (ref 21–32)
CREATININE: 9.38 mg/dL — AB (ref 0.60–1.30)
Chloride: 100 mmol/L (ref 98–107)
EGFR (Non-African Amer.): 5 — ABNORMAL LOW
GFR CALC AF AMER: 6 — AB
Glucose: 119 mg/dL — ABNORMAL HIGH (ref 65–99)
Osmolality: 299 (ref 275–301)
POTASSIUM: 4.7 mmol/L (ref 3.5–5.1)
SODIUM: 137 mmol/L (ref 136–145)

## 2014-02-25 LAB — URINE CULTURE

## 2014-02-26 DIAGNOSIS — I359 Nonrheumatic aortic valve disorder, unspecified: Secondary | ICD-10-CM

## 2014-02-26 LAB — CBC WITH DIFFERENTIAL/PLATELET
BASOS ABS: 0.1 10*3/uL (ref 0.0–0.1)
Basophil %: 1.1 %
Eosinophil #: 0.5 10*3/uL (ref 0.0–0.7)
Eosinophil %: 10.8 %
HCT: 34.6 % — AB (ref 40.0–52.0)
HGB: 11 g/dL — ABNORMAL LOW (ref 13.0–18.0)
Lymphocyte #: 0.5 10*3/uL — ABNORMAL LOW (ref 1.0–3.6)
Lymphocyte %: 10.8 %
MCH: 31.1 pg (ref 26.0–34.0)
MCHC: 31.8 g/dL — AB (ref 32.0–36.0)
MCV: 98 fL (ref 80–100)
MONO ABS: 0.5 x10 3/mm (ref 0.2–1.0)
Monocyte %: 11.4 %
Neutrophil #: 3.1 10*3/uL (ref 1.4–6.5)
Neutrophil %: 65.9 %
Platelet: 207 10*3/uL (ref 150–440)
RBC: 3.53 10*6/uL — ABNORMAL LOW (ref 4.40–5.90)
RDW: 15.8 % — ABNORMAL HIGH (ref 11.5–14.5)
WBC: 4.8 10*3/uL (ref 3.8–10.6)

## 2014-02-26 LAB — BASIC METABOLIC PANEL
Anion Gap: 13 (ref 7–16)
BUN: 88 mg/dL — ABNORMAL HIGH (ref 7–18)
CALCIUM: 8.5 mg/dL (ref 8.5–10.1)
CHLORIDE: 100 mmol/L (ref 98–107)
CREATININE: 10.55 mg/dL — AB (ref 0.60–1.30)
Co2: 23 mmol/L (ref 21–32)
GFR CALC AF AMER: 5 — AB
GFR CALC NON AF AMER: 4 — AB
Glucose: 136 mg/dL — ABNORMAL HIGH (ref 65–99)
Osmolality: 301 (ref 275–301)
Potassium: 5 mmol/L (ref 3.5–5.1)
Sodium: 136 mmol/L (ref 136–145)

## 2014-02-27 ENCOUNTER — Encounter: Payer: Self-pay | Admitting: Vascular Surgery

## 2014-02-27 LAB — BASIC METABOLIC PANEL
ANION GAP: 10 (ref 7–16)
BUN: 92 mg/dL — AB (ref 7–18)
CALCIUM: 8.2 mg/dL — AB (ref 8.5–10.1)
Chloride: 101 mmol/L (ref 98–107)
Co2: 24 mmol/L (ref 21–32)
Creatinine: 11.09 mg/dL — ABNORMAL HIGH (ref 0.60–1.30)
EGFR (African American): 5 — ABNORMAL LOW
EGFR (Non-African Amer.): 4 — ABNORMAL LOW
Glucose: 160 mg/dL — ABNORMAL HIGH (ref 65–99)
Osmolality: 302 (ref 275–301)
Potassium: 5.6 mmol/L — ABNORMAL HIGH (ref 3.5–5.1)
SODIUM: 135 mmol/L — AB (ref 136–145)

## 2014-02-27 LAB — CBC WITH DIFFERENTIAL/PLATELET
BASOS PCT: 1.1 %
Basophil #: 0.1 10*3/uL (ref 0.0–0.1)
EOS ABS: 0.5 10*3/uL (ref 0.0–0.7)
EOS PCT: 10 %
HCT: 35.5 % — AB (ref 40.0–52.0)
HGB: 11.1 g/dL — ABNORMAL LOW (ref 13.0–18.0)
LYMPHS ABS: 0.5 10*3/uL — AB (ref 1.0–3.6)
LYMPHS PCT: 10.6 %
MCH: 30.9 pg (ref 26.0–34.0)
MCHC: 31.4 g/dL — AB (ref 32.0–36.0)
MCV: 99 fL (ref 80–100)
MONO ABS: 0.5 x10 3/mm (ref 0.2–1.0)
MONOS PCT: 10 %
Neutrophil #: 3.3 10*3/uL (ref 1.4–6.5)
Neutrophil %: 68.3 %
PLATELETS: 206 10*3/uL (ref 150–440)
RBC: 3.6 10*6/uL — ABNORMAL LOW (ref 4.40–5.90)
RDW: 15.6 % — AB (ref 11.5–14.5)
WBC: 4.9 10*3/uL (ref 3.8–10.6)

## 2014-02-27 LAB — AEROBIC CULTURE

## 2014-02-27 LAB — POTASSIUM: POTASSIUM: 5.8 mmol/L — AB (ref 3.5–5.1)

## 2014-02-27 LAB — PHOSPHORUS: PHOSPHORUS: 8.6 mg/dL — AB (ref 2.5–4.9)

## 2014-02-28 ENCOUNTER — Encounter: Payer: Medicare Other | Admitting: Vascular Surgery

## 2014-02-28 LAB — BASIC METABOLIC PANEL
ANION GAP: 7 (ref 7–16)
BUN: 55 mg/dL — ABNORMAL HIGH (ref 7–18)
CALCIUM: 7.8 mg/dL — AB (ref 8.5–10.1)
CHLORIDE: 101 mmol/L (ref 98–107)
Co2: 29 mmol/L (ref 21–32)
Creatinine: 8 mg/dL — ABNORMAL HIGH (ref 0.60–1.30)
EGFR (Non-African Amer.): 6 — ABNORMAL LOW
GFR CALC AF AMER: 7 — AB
Glucose: 115 mg/dL — ABNORMAL HIGH (ref 65–99)
Osmolality: 290 (ref 275–301)
POTASSIUM: 5.2 mmol/L — AB (ref 3.5–5.1)
Sodium: 137 mmol/L (ref 136–145)

## 2014-03-01 ENCOUNTER — Other Ambulatory Visit: Payer: Self-pay | Admitting: *Deleted

## 2014-03-01 LAB — CULTURE, BLOOD (SINGLE)

## 2014-03-01 MED ORDER — OXYCODONE-ACETAMINOPHEN 5-325 MG PO TABS: 1.0000 | ORAL_TABLET | Freq: Four times a day (QID) | ORAL | Status: DC | PRN

## 2014-03-01 MED ORDER — AMBULATORY NON FORMULARY MEDICATION: Status: DC

## 2014-03-01 NOTE — Telephone Encounter (Signed)
[facility] 

## 2014-03-02 ENCOUNTER — Non-Acute Institutional Stay (SKILLED_NURSING_FACILITY): Payer: Medicare Other | Admitting: Internal Medicine

## 2014-03-02 ENCOUNTER — Encounter: Payer: Self-pay | Admitting: Internal Medicine

## 2014-03-02 ENCOUNTER — Encounter: Payer: Medicare Other | Admitting: Vascular Surgery

## 2014-03-02 DIAGNOSIS — A419 Sepsis, unspecified organism: Secondary | ICD-10-CM

## 2014-03-02 DIAGNOSIS — I119 Hypertensive heart disease without heart failure: Secondary | ICD-10-CM

## 2014-03-02 DIAGNOSIS — F32A Depression, unspecified: Secondary | ICD-10-CM | POA: Insufficient documentation

## 2014-03-02 DIAGNOSIS — N186 End stage renal disease: Secondary | ICD-10-CM

## 2014-03-02 DIAGNOSIS — E1129 Type 2 diabetes mellitus with other diabetic kidney complication: Secondary | ICD-10-CM | POA: Insufficient documentation

## 2014-03-02 DIAGNOSIS — E785 Hyperlipidemia, unspecified: Secondary | ICD-10-CM | POA: Insufficient documentation

## 2014-03-02 DIAGNOSIS — L299 Pruritus, unspecified: Secondary | ICD-10-CM | POA: Insufficient documentation

## 2014-03-02 DIAGNOSIS — L298 Other pruritus: Secondary | ICD-10-CM

## 2014-03-02 DIAGNOSIS — A4152 Sepsis due to Pseudomonas: Secondary | ICD-10-CM

## 2014-03-02 DIAGNOSIS — F329 Major depressive disorder, single episode, unspecified: Secondary | ICD-10-CM | POA: Insufficient documentation

## 2014-03-02 DIAGNOSIS — F3289 Other specified depressive episodes: Secondary | ICD-10-CM

## 2014-03-02 DIAGNOSIS — I2510 Atherosclerotic heart disease of native coronary artery without angina pectoris: Secondary | ICD-10-CM | POA: Insufficient documentation

## 2014-03-02 DIAGNOSIS — Z992 Dependence on renal dialysis: Secondary | ICD-10-CM

## 2014-03-02 NOTE — Progress Notes (Signed)
Patient ID: Albert Grant, male   DOB: 14-Nov-1940, 73 y.o.   MRN: [id number]     [place]   Allergies  Allergen Reactions  . Lisinopril Other (See Comments)    Per [facility]    Chief Complaint: new admission  HPI:  73 y/o male patient is here for long term care post hospital admission from 02/23/14- 02/28/14 with AMS in setting of pseudomonas bacteremia. He also had malignant hypertension. His catheter was removed. His culture grew psudomonas and it was pansensitive and he is been put on ceftazidime 2 g iv 3 times a week until 03/11/14.He has hx of CAD, type 2 DM and ESRD on dialysis. He refused his dialysis today. He complaints of generalized itching. He has not received any antibiotic since being in the facility. He is in no distress and denies any complaints. He mentions not feeling like going for dialysis today and thus decided not to go. Denies any shortness of breath or chest pain. He feels tired  Review of Systems:  Constitutional: Negative for fever, chills, weight loss, malaise/fatigue and diaphoresis.  HENT: Negative for congestion, hearing loss and sore throat.   Eyes: Negative for eye pain, blurred vision, double vision and discharge.  Respiratory: Negative for cough, sputum production, shortness of breath and wheezing.   Cardiovascular: Negative for chest pain, palpitations, orthopnea and leg swelling.  Gastrointestinal: Negative for heartburn, nausea, vomiting, abdominal pain, diarrhea and constipation.  Genitourinary: Negative for dysuria, urgency, frequency, hematuria and flank pain.  Musculoskeletal: Negative for back pain, falls, joint pain and myalgias.  Skin: Negative for rash.  Neurological: Negative for dizziness, tingling, focal weakness and headaches.  Psychiatric/Behavioral: The patient is not nervous/anxious.     Past Medical History  Diagnosis Date  . 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 Hypertension   . CAD (coronary artery disease)      with  angioplasty  . 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 Cardiomyopathy     with ejection fraction of 45 %  . Heart failure, systolic and diastolic   . Anemia   . CHF (congestive heart failure)    Past Surgical History  Procedure Laterality Date  . Spine surgery      spinal fusion  . Av fistula placement Left 01/16/2014    Procedure: ARTERIOVENOUS (AV) FISTULA CREATION; ULTRASOUND GUIDED;  Surgeon: Larina Earthly, MD;  Location: [facility] OR;  Service: Vascular;  Laterality: Left;   Social History:   reports that he has never smoked. He has never used smokeless tobacco. He reports that he does not drink alcohol or use illicit drugs.  History reviewed. No pertinent family history.  Medications: Patient's Medications  New Prescriptions   No medications on file  Previous Medications   AMBULATORY NON FORMULARY MEDICATION    Lorazepam 1mg /ml Gel Sig: Apply 67ml topically twice daily as needed for anxiety/agitation   AMINO ACIDS-PROTEIN HYDROLYS (FEEDING SUPPLEMENT, PRO-STAT SUGAR FREE 64,) LIQD    Take 30 mLs by mouth 2 (two) times daily. 10am and 10pm   ASPIRIN 81 MG TABLET    Take 81 mg by mouth daily.   ATORVASTATIN (LIPITOR) 80 MG TABLET    Take 80 mg by mouth at bedtime. For hypercholesterolemia   B COMPLEX-C (SUPER B COMPLEX) TABS    Take 1 tablet by mouth daily.    CALCIUM ACETATE (PHOSLO) 667 MG CAPSULE    Take 667-2,001 mg by mouth 4 (four) times  daily - after meals and at bedtime. Take 3 capsules (2001 mg) 3 times daily with meals, take 1 capsule (667 mg) every night at bedtime with snack   CARVEDILOL (COREG) 25 MG TABLET    Take 25 mg by mouth 2 (two) times daily with a meal. For HTN (10am & 10pm)   CEFTAZIDIME (FORTAZ) 2 G INJECTION    Inject 2 g into the muscle 3 (three) times a week. Until 03/11/14   CLONIDINE (CATAPRES - DOSED IN MG/24 HR) 0.3 MG/24HR PATCH    Place 0.2 mg onto the skin once a week.   CLOPIDOGREL (PLAVIX) 75 MG TABLET    Take 75 mg by mouth daily.    FERROUS  SULFATE 325 (65 FE) MG TABLET    Take 325 mg by mouth 3 (three) times daily with meals. 10am, 2pm, 10pm   GUAIFENESIN (ROBITUSSIN) 100 MG/5ML SYRUP    Take 200 mg by mouth every 4 (four) hours as needed for cough.   HYDRALAZINE (APRESOLINE) 50 MG TABLET    Take 75 mg by mouth 3 (three) times daily. For HTN (10am, 2pm, 10pm)   HYDROXYZINE (ATARAX/VISTARIL) 25 MG TABLET    Take 25 mg by mouth every 4 (four) hours as needed.   INSULIN ASPART (NOVOLOG) 100 UNIT/ML INJECTION    Inject 2 Units into the skin 3 (three) times daily before meals.   INSULIN GLARGINE (LANTUS) 100 UNIT/ML INJECTION    Inject 12 Units into the skin at bedtime.    ISOSORBIDE MONONITRATE (IMDUR) 60 MG 24 HR TABLET    Take 60 mg by mouth daily. For HTN   NITROGLYCERIN (NITROSTAT) 0.4 MG SL TABLET    Place 0.4 mg under the tongue every 5 (five) minutes as needed for chest pain (May repeat for 3 doses as needed for chest pain. If chest pain persist call the MD.).   ONDANSETRON (ZOFRAN) 4 MG TABLET    Take 4 mg by mouth every 8 (eight) hours as needed for nausea or vomiting.   OXYCODONE-ACETAMINOPHEN (PERCOCET/ROXICET) 5-325 MG PER TABLET    Take 1 tablet by mouth every 6 (six) hours as needed. For pain   PANTOPRAZOLE (PROTONIX) 40 MG TABLET    Take 40 mg by mouth 2 (two) times daily. For esophageal reflux (6:30am & 4:30pm)   PRESCRIPTION MEDICATION    Take 1 packet by mouth daily. Novosource (supplement)   PRESCRIPTION MEDICATION    Apply 1 mg topically 2 (two) times daily as needed (anxiety/agitation). Lorazepam 1 mg/ml gel   SERTRALINE (ZOLOFT) 100 MG TABLET    Take 100 mg by mouth daily. For mood  Modified Medications   No medications on file  Discontinued Medications   No medications on file     Physical Exam: BP 142/78  Pulse 62  Temp(Src) 97.9 F (36.6 C)  Resp 18  Ht 5\' 6"  (1.676 m)  Wt 146 lb (66.225 kg)  BMI 23.58 kg/m2  General- elderly male in no acute distress Head- atraumatic, normocephalic Eyes- PERRLA,  EOMI, no pallor, no icterus, no discharge Neck- no lymphadenopathy Cardiovascular- normal s1,s2, no murmurs/ rubs/ gallops Respiratory- bilateral clear to auscultation, no wheeze, no rhonchi, no crackles, no use of accessory muscles Abdomen- bowel sounds present, soft, non tender Musculoskeletal- able to move all 4 extremities, no spinal and paraspinal tenderness, no leg edema Neurological- no focal deficit Skin- warm and dry Psychiatry- alert and oriented to person   Labs reviewed: Basic Metabolic Panel:  Recent Labs  09/81/1902/07/25 0818  NA 139  K 4.5  GLUCOSE 88   CBC:  Recent Labs  01/16/14 0818  HGB 12.2*  HCT 36.0*   Cardiac Enzymes: No results found for this basename: CKTOTAL, CKMB, CKMBINDEX, TROPONINI,  in the last 8760 hours BNP: No components found with this basename: POCBNP,  CBG:  Recent Labs  01/16/14 0758 01/16/14 1151  GLUCAP 93 97    02/24/14 glu 122, na 134, k 4.2, bun 68, cr 7.68, wbc 12.3, hb 11  02/23/14 cxr- no active disease  Assessment/Plan  psudomonas bacteremia- pt is supposed to be on ceftazidime but has not received any since discharge as he refused dialysis. Reviewed culture sensitivity and it is sensitive to levofloxacin and ciprofloxacin. Will discontinue ceftazidime and have him on levofloxacin 500 mg po every 48 hours until 03/12/14 from today. Check cbc with diff, monitor temp curve  Hypertension- improved bp readings. Continue clonidine 0.2 mg weekly patch with hydralazine 75 mg tid and imdur  CAD- remains chest pain free. Continue asa, plavix, coreg, imdur and statin  ESRD- on HD 3 days a week. Pt agrees to go for next dialysis session.   hyperlipidemia- continue his statin  Dm type 2- monitor cbg, continue lantus and SSI  Depression- continue zoloft on daily basis  Pruritis- likely uremic pruritis. continue HD and prn atarax for now   Family/ staff Communication: reviewed care plan with patient and charge nurse   Goals of  care: long term care   Labs/tests ordered: cbc with diff, cmp with dialysis  Code status- full code  Oneal Grout, MD  [facility] [phone] (Monday-Friday 8 am - 5 pm) [phone] (afterhours)

## 2014-03-03 ENCOUNTER — Non-Acute Institutional Stay (SKILLED_NURSING_FACILITY): Payer: Medicare Other | Admitting: Internal Medicine

## 2014-03-03 DIAGNOSIS — A419 Sepsis, unspecified organism: Secondary | ICD-10-CM

## 2014-03-03 DIAGNOSIS — N186 End stage renal disease: Secondary | ICD-10-CM

## 2014-03-03 DIAGNOSIS — Z992 Dependence on renal dialysis: Secondary | ICD-10-CM

## 2014-03-03 DIAGNOSIS — A4152 Sepsis due to Pseudomonas: Secondary | ICD-10-CM

## 2014-03-03 LAB — CULTURE, BLOOD (SINGLE)

## 2014-03-03 NOTE — Progress Notes (Signed)
Patient ID: Albert Grant, male   DOB: 1941/06/05, 73 y.o.   MRN: [id number] Facility; [facility] Chief complaint; dialysis refusals History; I was told today that this patient has refused dialysis yesterday and also refused to attend dialysis today. He is a 73 year old [place] man who speaks English sparingly. He has end-stage renal disease on hemodialysis secondary to diabetes. As I understand things that he had been getting dialyzed through a right subclavian line while his left arm shunt matures. He recently returned from the hospital and was readmitted to our facility yesterday Apparently having Pseudomonas bacteremia. See that the catheter tip was positive for Pseudomonas although I don't see blood culture results and I really don't see sensitivities although the tone of Dr. Lady Deutscher note suggested that this was quinolone sensitive. During the hospitalization the right cuffed  tunneled dialysis catheter in the right internal jugular was removed as a source of his infection and as mentioned he refused to go to dialysis yesterday and also refused to go today.   Physical examination Gen. patient is afebrile he does not look to be in any distress Respiratory few bibasilar crackles Cardiac heart sounds are normal there is no murmurs Abdomen no liver spleen or tenderness. Extremities I don't actually see that he has a dialysis site at this point if indeed it is true as he is stating that his left arm shunt is still not mature enough to use Skin no overt rash although the patient is a really very paretic constantly scratching at his upper chest  Impression/plan #1 Pseudomonas bacteremia. He was supposed to be receiving his Elita Quick at dialysis although he has not been going. I note that in view of this he was switched to Levaquin yesterday at 500 mg every other day. It is specifically stated in Dr. Sander Radon is note that the Pseudomonas was sensitive although I don't actually see those results.  #2 chronic  renal failure on dialysis. I don't know that if he shows up for dialysis tomorrow that he actually has an access site. This would be assuming of course his left arm shunt is not usable as he seems to say. I will try to contact dialysis to verify that someone will be able to place a line on him tomorrow. He seems to be saying that someone tried 3 times to get another line in him and this is the reason he is refusing dialysis although again I don't have any information on this #3 listed as having dementia, once again I am skeptical about this. She was broken [place] she actually seems quite cognizant  I will need to verify with dialysis that day her set up to put a line in him. Further we need to inform him that he is now on alternate day Levaquin. So far I don't see any major issues with regards to his dialysis refusals. I think his generalized pruritus is probably related to chronic renal insufficiency

## 2014-03-13 ENCOUNTER — Non-Acute Institutional Stay (SKILLED_NURSING_FACILITY): Payer: Medicare Other | Admitting: Internal Medicine

## 2014-03-13 DIAGNOSIS — N039 Chronic nephritic syndrome with unspecified morphologic changes: Principal | ICD-10-CM

## 2014-03-13 DIAGNOSIS — R110 Nausea: Secondary | ICD-10-CM

## 2014-03-13 DIAGNOSIS — D631 Anemia in chronic kidney disease: Secondary | ICD-10-CM

## 2014-03-14 ENCOUNTER — Emergency Department (HOSPITAL_COMMUNITY): Payer: Medicare Other

## 2014-03-14 ENCOUNTER — Inpatient Hospital Stay (HOSPITAL_COMMUNITY)
Admission: EM | Admit: 2014-03-14 | Discharge: 2014-03-20 | DRG: 640 | Disposition: A | Discharge status: Skilled Nursing Facility | Payer: Medicare Other | Attending: Internal Medicine | Admitting: Internal Medicine

## 2014-03-14 ENCOUNTER — Encounter (HOSPITAL_COMMUNITY): Payer: Self-pay | Admitting: Emergency Medicine

## 2014-03-14 DIAGNOSIS — N058 Unspecified nephritic syndrome with other morphologic changes: Secondary | ICD-10-CM | POA: Diagnosis present

## 2014-03-14 DIAGNOSIS — L299 Pruritus, unspecified: Secondary | ICD-10-CM

## 2014-03-14 DIAGNOSIS — Z992 Dependence on renal dialysis: Secondary | ICD-10-CM

## 2014-03-14 DIAGNOSIS — Z79899 Other long term (current) drug therapy: Secondary | ICD-10-CM

## 2014-03-14 DIAGNOSIS — D631 Anemia in chronic kidney disease: Secondary | ICD-10-CM | POA: Diagnosis present

## 2014-03-14 DIAGNOSIS — R4182 Altered mental status, unspecified: Secondary | ICD-10-CM

## 2014-03-14 DIAGNOSIS — I2510 Atherosclerotic heart disease of native coronary artery without angina pectoris: Secondary | ICD-10-CM | POA: Diagnosis present

## 2014-03-14 DIAGNOSIS — IMO0001 Reserved for inherently not codable concepts without codable children: Secondary | ICD-10-CM | POA: Diagnosis present

## 2014-03-14 DIAGNOSIS — I2589 Other forms of chronic ischemic heart disease: Secondary | ICD-10-CM | POA: Diagnosis present

## 2014-03-14 DIAGNOSIS — T7840XA Allergy, unspecified, initial encounter: Secondary | ICD-10-CM

## 2014-03-14 DIAGNOSIS — Z7902 Long term (current) use of antithrombotics/antiplatelets: Secondary | ICD-10-CM

## 2014-03-14 DIAGNOSIS — L298 Other pruritus: Secondary | ICD-10-CM

## 2014-03-14 DIAGNOSIS — F32A Depression, unspecified: Secondary | ICD-10-CM

## 2014-03-14 DIAGNOSIS — A4152 Sepsis due to Pseudomonas: Secondary | ICD-10-CM | POA: Diagnosis present

## 2014-03-14 DIAGNOSIS — R110 Nausea: Secondary | ICD-10-CM

## 2014-03-14 DIAGNOSIS — E1129 Type 2 diabetes mellitus with other diabetic kidney complication: Secondary | ICD-10-CM | POA: Diagnosis present

## 2014-03-14 DIAGNOSIS — F0390 Unspecified dementia without behavioral disturbance: Secondary | ICD-10-CM | POA: Diagnosis present

## 2014-03-14 DIAGNOSIS — Z794 Long term (current) use of insulin: Secondary | ICD-10-CM

## 2014-03-14 DIAGNOSIS — Z515 Encounter for palliative care: Secondary | ICD-10-CM

## 2014-03-14 DIAGNOSIS — Z7982 Long term (current) use of aspirin: Secondary | ICD-10-CM

## 2014-03-14 DIAGNOSIS — G9340 Encephalopathy, unspecified: Secondary | ICD-10-CM | POA: Diagnosis present

## 2014-03-14 DIAGNOSIS — N186 End stage renal disease: Secondary | ICD-10-CM | POA: Diagnosis present

## 2014-03-14 DIAGNOSIS — E875 Hyperkalemia: Principal | ICD-10-CM | POA: Diagnosis present

## 2014-03-14 DIAGNOSIS — I119 Hypertensive heart disease without heart failure: Secondary | ICD-10-CM

## 2014-03-14 DIAGNOSIS — F329 Major depressive disorder, single episode, unspecified: Secondary | ICD-10-CM | POA: Diagnosis present

## 2014-03-14 DIAGNOSIS — N039 Chronic nephritic syndrome with unspecified morphologic changes: Secondary | ICD-10-CM

## 2014-03-14 DIAGNOSIS — Z9115 Patient's noncompliance with renal dialysis: Secondary | ICD-10-CM

## 2014-03-14 DIAGNOSIS — Z981 Arthrodesis status: Secondary | ICD-10-CM

## 2014-03-14 DIAGNOSIS — E785 Hyperlipidemia, unspecified: Secondary | ICD-10-CM

## 2014-03-14 DIAGNOSIS — Z91158 Patient's noncompliance with renal dialysis for other reason: Secondary | ICD-10-CM

## 2014-03-14 DIAGNOSIS — N19 Unspecified kidney failure: Secondary | ICD-10-CM

## 2014-03-14 DIAGNOSIS — G9349 Other encephalopathy: Secondary | ICD-10-CM | POA: Diagnosis present

## 2014-03-14 DIAGNOSIS — F3289 Other specified depressive episodes: Secondary | ICD-10-CM | POA: Diagnosis present

## 2014-03-14 DIAGNOSIS — I255 Ischemic cardiomyopathy: Secondary | ICD-10-CM

## 2014-03-14 DIAGNOSIS — I5040 Unspecified combined systolic (congestive) and diastolic (congestive) heart failure: Secondary | ICD-10-CM | POA: Diagnosis present

## 2014-03-14 DIAGNOSIS — I509 Heart failure, unspecified: Secondary | ICD-10-CM | POA: Diagnosis present

## 2014-03-14 LAB — URINALYSIS, ROUTINE W REFLEX MICROSCOPIC
BILIRUBIN URINE: NEGATIVE
Glucose, UA: 100 mg/dL — AB
Ketones, ur: NEGATIVE mg/dL
LEUKOCYTES UA: NEGATIVE
NITRITE: NEGATIVE
PH: 5.5 (ref 5.0–8.0)
Specific Gravity, Urine: 1.023 (ref 1.005–1.030)
UROBILINOGEN UA: 0.2 mg/dL (ref 0.0–1.0)

## 2014-03-14 LAB — CBC
HCT: 26.8 % — ABNORMAL LOW (ref 39.0–52.0)
Hemoglobin: 8.9 g/dL — ABNORMAL LOW (ref 13.0–17.0)
MCH: 31.9 pg (ref 26.0–34.0)
MCHC: 33.2 g/dL (ref 30.0–36.0)
MCV: 96.1 fL (ref 78.0–100.0)
Platelets: 179 10*3/uL (ref 150–400)
RBC: 2.79 MIL/uL — AB (ref 4.22–5.81)
RDW: 14.1 % (ref 11.5–15.5)
WBC: 4.3 10*3/uL (ref 4.0–10.5)

## 2014-03-14 LAB — I-STAT CHEM 8, ED
BUN: >140 mg/dL — ABNORMAL HIGH (ref 6–23)
CHLORIDE: 100 meq/L (ref 96–112)
Calcium, Ion: 1.18 mmol/L (ref 1.13–1.30)
Creatinine, Ser: 12.8 mg/dL — ABNORMAL HIGH (ref 0.50–1.35)
GLUCOSE: 238 mg/dL — AB (ref 70–99)
HCT: 26 % — ABNORMAL LOW (ref 39.0–52.0)
Hemoglobin: 8.8 g/dL — ABNORMAL LOW (ref 13.0–17.0)
Potassium: 7.5 mEq/L (ref 3.7–5.3)
Sodium: 129 mEq/L — ABNORMAL LOW (ref 137–147)
TCO2: 19 mmol/L (ref 0–100)

## 2014-03-14 LAB — CBC WITH DIFFERENTIAL/PLATELET
Basophils Absolute: 0 10*3/uL (ref 0.0–0.1)
Basophils Relative: 0 % (ref 0–1)
EOS PCT: 0 % (ref 0–5)
Eosinophils Absolute: 0 10*3/uL (ref 0.0–0.7)
HCT: 27.1 % — ABNORMAL LOW (ref 39.0–52.0)
HEMOGLOBIN: 8.9 g/dL — AB (ref 13.0–17.0)
Lymphocytes Relative: 5 % — ABNORMAL LOW (ref 12–46)
Lymphs Abs: 0.2 10*3/uL — ABNORMAL LOW (ref 0.7–4.0)
MCH: 31.9 pg (ref 26.0–34.0)
MCHC: 32.8 g/dL (ref 30.0–36.0)
MCV: 97.1 fL (ref 78.0–100.0)
Monocytes Absolute: 0 10*3/uL — ABNORMAL LOW (ref 0.1–1.0)
Monocytes Relative: 1 % — ABNORMAL LOW (ref 3–12)
NEUTROS PCT: 94 % — AB (ref 43–77)
Neutro Abs: 4.2 10*3/uL (ref 1.7–7.7)
Platelets: 176 10*3/uL (ref 150–400)
RBC: 2.79 MIL/uL — ABNORMAL LOW (ref 4.22–5.81)
RDW: 14 % (ref 11.5–15.5)
WBC: 4.5 10*3/uL (ref 4.0–10.5)

## 2014-03-14 LAB — POTASSIUM: Potassium: 6.9 mEq/L (ref 3.7–5.3)

## 2014-03-14 LAB — COMPREHENSIVE METABOLIC PANEL
ALK PHOS: 247 U/L — AB (ref 39–117)
ALT: 141 U/L — AB (ref 0–53)
AST: 216 U/L — ABNORMAL HIGH (ref 0–37)
Albumin: 2.7 g/dL — ABNORMAL LOW (ref 3.5–5.2)
BUN: 138 mg/dL — AB (ref 6–23)
CO2: 17 meq/L — AB (ref 19–32)
Calcium: 9.2 mg/dL (ref 8.4–10.5)
Chloride: 92 mEq/L — ABNORMAL LOW (ref 96–112)
Creatinine, Ser: 12.25 mg/dL — ABNORMAL HIGH (ref 0.50–1.35)
GFR, EST AFRICAN AMERICAN: 4 mL/min — AB (ref >90)
GFR, EST NON AFRICAN AMERICAN: 3 mL/min — AB (ref >90)
GLUCOSE: 243 mg/dL — AB (ref 70–99)
Sodium: 130 mEq/L — ABNORMAL LOW (ref 137–147)
Total Bilirubin: 0.5 mg/dL (ref 0.3–1.2)
Total Protein: 6.2 g/dL (ref 6.0–8.3)

## 2014-03-14 LAB — CREATININE, SERUM
Creatinine, Ser: 12.1 mg/dL — ABNORMAL HIGH (ref 0.50–1.35)
GFR calc non Af Amer: 4 mL/min — ABNORMAL LOW (ref >90)
GFR, EST AFRICAN AMERICAN: 4 mL/min — AB (ref >90)

## 2014-03-14 LAB — I-STAT CG4 LACTIC ACID, ED: LACTIC ACID, VENOUS: 0.49 mmol/L — AB (ref 0.5–2.2)

## 2014-03-14 LAB — PROCALCITONIN: PROCALCITONIN: 1.26 ng/mL

## 2014-03-14 LAB — URINE MICROSCOPIC-ADD ON

## 2014-03-14 MED ORDER — CARVEDILOL 25 MG PO TABS
25.0000 mg | ORAL_TABLET | Freq: Two times a day (BID) | ORAL | Status: DC
  Administered 2014-03-17 – 2014-03-19 (×3): 25 mg via ORAL
  Filled 2014-03-14 (×14): qty 1

## 2014-03-14 MED ORDER — SERTRALINE HCL 100 MG PO TABS
100.0000 mg | ORAL_TABLET | Freq: Every day | ORAL | Status: DC
  Administered 2014-03-17 – 2014-03-19 (×3): 100 mg via ORAL
  Filled 2014-03-14 (×6): qty 1

## 2014-03-14 MED ORDER — PRO-STAT SUGAR FREE PO LIQD
30.0000 mL | Freq: Two times a day (BID) | ORAL | Status: DC
  Administered 2014-03-15 – 2014-03-18 (×4): 30 mL via ORAL
  Filled 2014-03-14 (×13): qty 30

## 2014-03-14 MED ORDER — CALCIUM ACETATE 667 MG PO CAPS
2001.0000 mg | ORAL_CAPSULE | Freq: Three times a day (TID) | ORAL | Status: DC
  Administered 2014-03-17 – 2014-03-19 (×3): 2001 mg via ORAL
  Filled 2014-03-14 (×18): qty 3

## 2014-03-14 MED ORDER — ISOSORBIDE MONONITRATE ER 60 MG PO TB24
60.0000 mg | ORAL_TABLET | Freq: Every day | ORAL | Status: DC
  Administered 2014-03-16 – 2014-03-19 (×4): 60 mg via ORAL
  Filled 2014-03-14 (×6): qty 1

## 2014-03-14 MED ORDER — HEPARIN SODIUM (PORCINE) 5000 UNIT/ML IJ SOLN
5000.0000 [IU] | Freq: Three times a day (TID) | INTRAMUSCULAR | Status: DC
  Administered 2014-03-15 – 2014-03-19 (×9): 5000 [IU] via SUBCUTANEOUS
  Filled 2014-03-14 (×18): qty 1

## 2014-03-14 MED ORDER — NITROGLYCERIN 0.4 MG SL SUBL: 0.4000 mg | SUBLINGUAL_TABLET | SUBLINGUAL | Status: DC | PRN

## 2014-03-14 MED ORDER — GUAIFENESIN 100 MG/5ML PO SYRP
200.0000 mg | ORAL_SOLUTION | ORAL | Status: DC | PRN
  Filled 2014-03-14: qty 10

## 2014-03-14 MED ORDER — DEXTROSE 50 % IV SOLN
1.0000 | Freq: Once | INTRAVENOUS | Status: AC
  Administered 2014-03-14: 50 mL via INTRAVENOUS
  Filled 2014-03-14: qty 50

## 2014-03-14 MED ORDER — SUPER B COMPLEX PO TABS: 1.0000 | ORAL_TABLET | Freq: Every day | ORAL | Status: DC

## 2014-03-14 MED ORDER — VANCOMYCIN HCL IN DEXTROSE 1-5 GM/200ML-% IV SOLN
1000.0000 mg | Freq: Once | INTRAVENOUS | Status: AC
  Administered 2014-03-15: 1000 mg via INTRAVENOUS
  Filled 2014-03-14: qty 200

## 2014-03-14 MED ORDER — FERROUS SULFATE 325 (65 FE) MG PO TABS
325.0000 mg | ORAL_TABLET | Freq: Three times a day (TID) | ORAL | Status: DC
  Administered 2014-03-17 – 2014-03-19 (×3): 325 mg via ORAL
  Filled 2014-03-14 (×19): qty 1

## 2014-03-14 MED ORDER — SODIUM BICARBONATE 8.4 % IV SOLN
50.0000 meq | Freq: Once | INTRAVENOUS | Status: AC
  Administered 2014-03-14: 50 meq via INTRAVENOUS
  Filled 2014-03-14: qty 50

## 2014-03-14 MED ORDER — PANTOPRAZOLE SODIUM 40 MG PO TBEC
40.0000 mg | DELAYED_RELEASE_TABLET | Freq: Two times a day (BID) | ORAL | Status: DC
  Administered 2014-03-15 – 2014-03-19 (×6): 40 mg via ORAL
  Filled 2014-03-14 (×8): qty 1

## 2014-03-14 MED ORDER — CLOPIDOGREL BISULFATE 75 MG PO TABS
75.0000 mg | ORAL_TABLET | Freq: Every day | ORAL | Status: DC
  Administered 2014-03-16 – 2014-03-19 (×4): 75 mg via ORAL
  Filled 2014-03-14 (×6): qty 1

## 2014-03-14 MED ORDER — INSULIN ASPART 100 UNIT/ML ~~LOC~~ SOLN
10.0000 [IU] | Freq: Once | SUBCUTANEOUS | Status: AC
  Administered 2014-03-14: 10 [IU] via INTRAVENOUS
  Filled 2014-03-14: qty 1

## 2014-03-14 MED ORDER — PIPERACILLIN-TAZOBACTAM 3.375 G IVPB 30 MIN
3.3750 g | Freq: Once | INTRAVENOUS | Status: AC
  Administered 2014-03-15: 3.375 g via INTRAVENOUS
  Filled 2014-03-14: qty 50

## 2014-03-14 MED ORDER — SODIUM POLYSTYRENE SULFONATE 15 GM/60ML PO SUSP
30.0000 g | Freq: Once | ORAL | Status: AC
  Administered 2014-03-14: 30 g via ORAL
  Filled 2014-03-14: qty 120

## 2014-03-14 MED ORDER — INSULIN GLARGINE 100 UNIT/ML ~~LOC~~ SOLN
6.0000 [IU] | Freq: Every day | SUBCUTANEOUS | Status: DC
  Filled 2014-03-14: qty 0.06

## 2014-03-14 MED ORDER — CLONIDINE HCL 0.2 MG/24HR TD PTWK: 0.2000 mg | MEDICATED_PATCH | TRANSDERMAL | Status: DC

## 2014-03-14 MED ORDER — ATORVASTATIN CALCIUM 80 MG PO TABS
80.0000 mg | ORAL_TABLET | Freq: Every day | ORAL | Status: DC
  Administered 2014-03-15 – 2014-03-18 (×4): 80 mg via ORAL
  Filled 2014-03-14 (×6): qty 1

## 2014-03-14 MED ORDER — ASPIRIN 81 MG PO TABS: 81.0000 mg | ORAL_TABLET | Freq: Every day | ORAL | Status: DC

## 2014-03-14 MED ORDER — ONDANSETRON HCL 4 MG PO TABS: 4.0000 mg | ORAL_TABLET | Freq: Three times a day (TID) | ORAL | Status: DC | PRN

## 2014-03-14 MED ORDER — HYDRALAZINE HCL 50 MG PO TABS
75.0000 mg | ORAL_TABLET | Freq: Three times a day (TID) | ORAL | Status: DC
  Administered 2014-03-15 – 2014-03-19 (×7): 75 mg via ORAL
  Filled 2014-03-14 (×19): qty 1

## 2014-03-14 MED ORDER — HYDROXYZINE HCL 25 MG PO TABS
25.0000 mg | ORAL_TABLET | ORAL | Status: DC | PRN
  Filled 2014-03-14: qty 1

## 2014-03-14 NOTE — H&P (Addendum)
Hospitalist Admission History and Physical  Patient name: Albert Grant Medical record number: [id number] Date of birth: 21-Oct-1941 Age: 73 y.o. Gender: male  Primary Care Provider: No PCP Per Patient  Chief Complaint: sepsis, hyperkalemia, encephalopathy  History of Present Illness:This is a 73 y.o. year old male with end-stage renal disease with dialysis noncompliance, recent admission at [facility] for Pseudomonas sepsis, type 2 diabetes, dementia, coronary artery disease presenting with encephalopathy, sepsis, hyperkalemia. Level V caveat as patient is acutely encephalopathic. Per report, patient was sent to the ER because of bradycardia. At presentation patient had a potassium of 7.5, bicarbonate of 14, BUN of around 140, creatinine 12.5, glucose 243, hemoglobin 8.9. Temperature 96.4 presentation and as well as heart rate of 59. Blood pressure 150s 180s. Satting 97% on room air. He was initially having ectopy and PACs on EKG. Patient given Kayexalate, IV insulin 5, bicarbonate, IV calcium. Nephrology was called to start emergency dialysis. [facility] asked for medical admission. Of note, patient was recently admitted at [facility] for Pseudomonas bacteremia. Was supposed to be getting Fortaz IV with dialysis for treatment. However, patient has missed multiple dialysis appointments.   Patient Active Problem List   Diagnosis Date Noted  . Hyperkalemia 03/14/2014  . Pseudomonas sepsis 03/02/2014  . ESRD on dialysis 03/02/2014  . DM type 2 causing renal disease 03/02/2014  . CAD (coronary artery disease) 03/02/2014  . Depression 03/02/2014  . Uremic pruritus 03/02/2014  . Hyperlipidemia 03/02/2014  . Hypertensive heart disease 03/02/2014  . Cardiomyopathy, ischemic 12/22/2013  . Type II or unspecified type diabetes mellitus with renal manifestations, not stated as uncontrolled 12/22/2013  . Coronary atherosclerosis of native coronary artery 12/22/2013  . End stage renal disease 12/22/2013  .  Allergic reaction 12/14/2013   Past Medical History: Past Medical History  Diagnosis Date  . 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 Hypertension   . CAD (coronary artery disease)      with angioplasty  . 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 Cardiomyopathy     with ejection fraction of 45 %  . Heart failure, systolic and diastolic   . Anemia   . CHF (congestive heart failure)     Past Surgical History: Past Surgical History  Procedure Laterality Date  . Spine surgery      spinal fusion  . Av fistula placement Left 01/16/2014    Procedure: ARTERIOVENOUS (AV) FISTULA CREATION; ULTRASOUND GUIDED;  Surgeon: Larina Earthly, MD;  Location: [facility] OR;  Service: Vascular;  Laterality: Left;    Social History: History   Social History  . Marital Status: Married    Spouse Name: N/A    Number of Children: N/A  . Years of Education: N/A   Social History Main Topics  . Smoking status: Never Smoker   . Smokeless tobacco: Never Used  . Alcohol Use: No  . Drug Use: No  . Sexual Activity: None   Other Topics Concern  . None   Social History Narrative  . None    Family History: History reviewed. No pertinent family history.  Allergies: Allergies  Allergen Reactions  . Lisinopril Other (See Comments)    Per MAR    Current Facility-Administered Medications  Medication Dose Route Frequency Provider Last Rate Last Dose  . [START ON 03/15/2014] aspirin tablet 81 mg  81 mg Oral Daily Doree Albee, MD      . atorvastatin (LIPITOR) tablet 80 mg  80  mg Oral QHS Doree Albee, MD      . Melene Muller ON 03/15/2014] calcium acetate (PHOSLO) capsule 667-2,001 mg  667-2,001 mg Oral TID PC & HS Doree Albee, MD      . Melene Muller ON 03/15/2014] carvedilol (COREG) tablet 25 mg  25 mg Oral BID WC Doree Albee, MD      . cloNIDine (CATAPRES - Dosed in mg/24 hr) patch 0.2 mg  0.2 mg Transdermal Weekly Doree Albee, MD      . Melene Muller ON 03/15/2014] clopidogrel (PLAVIX) tablet 75 mg   75 mg Oral Daily Doree Albee, MD      . feeding supplement (PRO-STAT SUGAR FREE 64) liquid 30 mL  30 mL Oral BID Doree Albee, MD      . Melene Muller ON 03/15/2014] ferrous sulfate tablet 325 mg  325 mg Oral TID WC Doree Albee, MD      . guaifenesin (ROBITUSSIN) 100 MG/5ML syrup 200 mg  200 mg Oral Q4H PRN Doree Albee, MD      . heparin injection 5,000 Units  5,000 Units Subcutaneous 3 times per day Doree Albee, MD      . hydrALAZINE (APRESOLINE) tablet 75 mg  75 mg Oral TID Doree Albee, MD      . hydrOXYzine (ATARAX/VISTARIL) tablet 25 mg  25 mg Oral Q4H PRN Doree Albee, MD      . insulin glargine (LANTUS) injection 6 Units  6 Units Subcutaneous QHS Doree Albee, MD      . Melene Muller ON 03/15/2014] isosorbide mononitrate (IMDUR) 24 hr tablet 60 mg  60 mg Oral Daily Doree Albee, MD      . nitroGLYCERIN (NITROSTAT) SL tablet 0.4 mg  0.4 mg Sublingual Q5 min PRN Doree Albee, MD      . ondansetron [facility]) tablet 4 mg  4 mg Oral Q8H PRN Doree Albee, MD      . pantoprazole (PROTONIX) EC tablet 40 mg  40 mg Oral BID Doree Albee, MD      . Melene Muller ON 03/15/2014] sertraline (ZOLOFT) tablet 100 mg  100 mg Oral Daily Doree Albee, MD      . Melene Muller ON 03/15/2014] SUPER B COMPLEX TABS 1 tablet  1 tablet Oral Daily Doree Albee, MD       Current Outpatient Prescriptions  Medication Sig Dispense Refill  . Amino Acids-Protein Hydrolys (FEEDING SUPPLEMENT, PRO-STAT SUGAR FREE 64,) LIQD Take 30 mLs by mouth 2 (two) times daily. 10am and 10pm      . aspirin 81 MG tablet Take 81 mg by mouth daily.      Marland Kitchen atorvastatin (LIPITOR) 80 MG tablet Take 80 mg by mouth at bedtime. For hypercholesterolemia      . B Complex-C (SUPER B COMPLEX) TABS Take 1 tablet by mouth daily.       . calcium acetate (PHOSLO) 667 MG capsule Take 667-2,001 mg by mouth 4 (four) times daily - after meals and at bedtime. Take 3 capsules (2001 mg) 3 times daily with meals, take 1 capsule (667 mg) every night at bedtime with snack      .  carvedilol (COREG) 25 MG tablet Take 25 mg by mouth 2 (two) times daily with a meal. For HTN (10am & 10pm)      . cefTAZidime (FORTAZ) 2 G injection Inject 2 g into the muscle 3 (three) times a week. Until 03/11/14 On Tuesday,thursday and Saturday at dialysis      . cloNIDine (CATAPRES - DOSED IN MG/24 HR) 0.3 mg/24hr patch Place 0.2  mg onto the skin once a week.      . clopidogrel (PLAVIX) 75 MG tablet Take 75 mg by mouth daily.       . ferrous sulfate 325 (65 FE) MG tablet Take 325 mg by mouth 3 (three) times daily with meals. 10am, 2pm, 10pm      . guaifenesin (ROBITUSSIN) 100 MG/5ML syrup Take 200 mg by mouth every 4 (four) hours as needed for cough.      . hydrALAZINE (APRESOLINE) 50 MG tablet Take 75 mg by mouth 3 (three) times daily. For HTN (10am, 2pm, 10pm)      . hydrOXYzine (ATARAX/VISTARIL) 25 MG tablet Take 25 mg by mouth every 4 (four) hours as needed for anxiety.       . insulin aspart (NOVOLOG) 100 UNIT/ML injection Inject 2 Units into the skin 3 (three) times daily before meals.      . insulin glargine (LANTUS) 100 UNIT/ML injection Inject 12 Units into the skin at bedtime.       . isosorbide mononitrate (IMDUR) 60 MG 24 hr tablet Take 60 mg by mouth daily. For HTN      . nitroGLYCERIN (NITROSTAT) 0.4 MG SL tablet Place 0.4 mg under the tongue every 5 (five) minutes as needed for chest pain (May repeat for 3 doses as needed for chest pain. If chest pain persist call the MD.).      Marland Kitchen ondansetron (ZOFRAN) 4 MG tablet Take 4 mg by mouth every 8 (eight) hours as needed for nausea or vomiting.      Marland Kitchen oxyCODONE-acetaminophen (PERCOCET/ROXICET) 5-325 MG per tablet Take 1 tablet by mouth every 6 (six) hours as needed for severe pain (pain).      . pantoprazole (PROTONIX) 40 MG tablet Take 40 mg by mouth 2 (two) times daily. For esophageal reflux (6:30am & 4:30pm)      . PRESCRIPTION MEDICATION Apply 1 mg topically 2 (two) times daily as needed (anxiety/agitation). Lorazepam 1 mg/ml gel      .  sertraline (ZOLOFT) 100 MG tablet Take 100 mg by mouth daily. For mood      . PRESCRIPTION MEDICATION Take 1 packet by mouth daily. Novosource (supplement)       Review Of Systems: 12 point ROS negative except as noted above in HPI.  Physical Exam: Filed Vitals:   03/14/14 2230  BP: 188/77  Pulse: 59  Temp:   Resp: 15    General: obtunded, unresponsive to questions  HEENT: PERRLA Heart: S1, S2 normal, no murmur, rub or gallop, regular rate and rhythm Lungs: clear to auscultation, no wheezes or rales and unlabored breathing Abdomen: abdomen is soft without significant tenderness, masses, organomegaly or guarding Extremities: extremities normal, atraumatic, no cyanosis or edema Skin:no rashes Neurology: normal without focal findings  Labs and Imaging: Lab Results  Component Value Date/Time   NA 129* 03/14/2014  9:03 PM   K 7.5* 03/14/2014  9:03 PM   CL 100 03/14/2014  9:03 PM   CO2 17* 03/14/2014  8:38 PM   BUN >140* 03/14/2014  9:03 PM   CREATININE 12.80* 03/14/2014  9:03 PM   GLUCOSE 238* 03/14/2014  9:03 PM   Lab Results  Component Value Date   WBC 4.5 03/14/2014   HGB 8.8* 03/14/2014   HCT 26.0* 03/14/2014   MCV 97.1 03/14/2014   PLT 176 03/14/2014    Dg Chest Port 1 View  03/14/2014   CLINICAL DATA:  Bradycardia, hypotension.  EXAM: PORTABLE CHEST - 1 VIEW  COMPARISON:  DG CHEST 2 VIEW dated 01/16/2014  FINDINGS: The cardiac silhouette appears moderately enlarged, similar. And mediastinal silhouette is nonsuspicious, mildly calcified. Mild central pulmonary vasculature congestion with decreased from prior examination with interval removal of dialysis catheter. No pleural effusions or focal consolidations though, the costophrenic angles are incompletely imaged. No pneumothorax.  Symmetric calcifications in the neck are likely vasculature. Multiple EKG lines overlie the patient and may obscure subtle underlying pathology. Soft tissue planes and included osseous structures are nonsuspicious.   IMPRESSION: Stable cardiomegaly, decreased central pulmonary vasculature congestion. Interval removal of dialysis catheter.   Electronically Signed   By: Awilda Metroourtnay  Bloomer   On: 03/14/2014 22:04     Assessment and Plan: Robyn HaberRene Gasner is a 73 y.o. year old male presenting with sepsis, encephalopathy, hyperkalemia  Encephalopathy: Likely secondary to uremia as well as sepsis in the setting of end-stage renal disease and recent admission for Pseudomonas bacteremia. Meets criteria for emergent dialysis. Nephrology consult. Plan for emergent dialysis tonight. Also check ammonia level.   Sepsis: Recent admission for pansensitive Pseudomonas bacteremia. Was initially on Fortaz. We'll place on vancomycin and Zosyn for broad-spectrum coverage with repeat blood and urine cultures. Bacteremia thought to be ? Catheter related from most recent admission. May also need infectious disease consult in house. Consider CCM consult if hypothermia persists, BP drops. Check PCT.   Hyperkalemia/ESRD: Noncompliant with dialysis. Status post IV insulin, bicarbonate, Kayexalate, calcium. Pending emergent dialysis. Renal consulted. Per renal recs.   HTN: BPs ULN pending HD. NPO. Reassess BP s/p HD.   Anemia: Likely ACD. No active signs of bleeding. Continue to follow.   DM: SSI. A1C  FEN/GI: NPO for now pending resolution of encephalopathy Prophylaxis: sub q heparin Disposition: pending further evaluation  Code Status:Full Code        Doree AlbeeSteven Keiondra Brookover MD  Pager: [phone]

## 2014-03-14 NOTE — ED Provider Notes (Signed)
CSN: [id number]     Arrival date & time 03/14/14  2015 History   First MD Initiated Contact with Patient 03/14/14 2023     Chief Complaint  Patient presents with  . Bradycardia  . Missed dialysis   . Hypotension     (Consider location/radiation/quality/duration/timing/severity/associated sxs/prior Treatment) HPI  This is a 73 year old with history of diabetes, hypertension, coronary artery disease, end-stage renal disease on dialysis, and recent admission for Pseudomonas sepsis at [facility] who presents with altered mental status and bradycardia. Per EMS, they were called to the patient's nursing home for heart rates in the 20s. He has refused the last 2 dialysis sessions. He was also noted to be hypotensive. Patient was given atropine and 1 g of calcium in route. Patient is noncontributory to history taking. He is acutely encephalopathic. All he says his "I have pain all over."  Level 5 caveat for AMS  Past Medical History  Diagnosis Date  . 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 Hypertension   . CAD (coronary artery disease)      with angioplasty  . 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 Cardiomyopathy     with ejection fraction of 45 %  . Heart failure, systolic and diastolic   . Anemia   . CHF (congestive heart failure)    Past Surgical History  Procedure Laterality Date  . Spine surgery      spinal fusion  . Av fistula placement Left 01/16/2014    Procedure: ARTERIOVENOUS (AV) FISTULA CREATION; ULTRASOUND GUIDED;  Surgeon: Larina Earthly, MD;  Location: [facility] OR;  Service: Vascular;  Laterality: Left;   History reviewed. No pertinent family history. History  Substance Use Topics  . Smoking status: Never Smoker   . Smokeless tobacco: Never Used  . Alcohol Use: No    Review of Systems  Unable to perform ROS: Mental status change      Allergies  Lisinopril  Home Medications   Prior to Admission medications   Medication Sig Start  Date End Date Taking? Authorizing Provider  AMBULATORY NON FORMULARY MEDICATION Lorazepam 1mg /ml Gel Sig: Apply 1ml topically twice daily as needed for anxiety/agitation 03/01/14  Yes Claudie Revering, NP  Amino Acids-Protein Hydrolys (FEEDING SUPPLEMENT, PRO-STAT SUGAR FREE 64,) LIQD Take 30 mLs by mouth 2 (two) times daily. 10am and 10pm   Yes Historical Provider, MD  aspirin 81 MG tablet Take 81 mg by mouth daily.   Yes Historical Provider, MD  atorvastatin (LIPITOR) 80 MG tablet Take 80 mg by mouth at bedtime. For hypercholesterolemia   Yes Historical Provider, MD  B Complex-C (SUPER B COMPLEX) TABS Take 1 tablet by mouth daily.    Yes Historical Provider, MD  calcium acetate (PHOSLO) 667 MG capsule Take 667-2,001 mg by mouth 4 (four) times daily - after meals and at bedtime. Take 3 capsules (2001 mg) 3 times daily with meals, take 1 capsule (667 mg) every night at bedtime with snack   Yes Historical Provider, MD  carvedilol (COREG) 25 MG tablet Take 25 mg by mouth 2 (two) times daily with a meal. For HTN (10am & 10pm)   Yes Historical Provider, MD  cefTAZidime (FORTAZ) 2 G injection Inject 2 g into the muscle 3 (three) times a week. Until 03/11/14   Yes Historical Provider, MD  cloNIDine (CATAPRES - DOSED IN MG/24 HR) 0.3 mg/24hr patch Place 0.2 mg onto the skin once a week.  Yes Historical Provider, MD  clopidogrel (PLAVIX) 75 MG tablet Take 75 mg by mouth daily.    Yes Historical Provider, MD  ferrous sulfate 325 (65 FE) MG tablet Take 325 mg by mouth 3 (three) times daily with meals. 10am, 2pm, 10pm   Yes Historical Provider, MD  hydrALAZINE (APRESOLINE) 50 MG tablet Take 75 mg by mouth 3 (three) times daily. For HTN (10am, 2pm, 10pm)   Yes Historical Provider, MD  hydrOXYzine (ATARAX/VISTARIL) 25 MG tablet Take 25 mg by mouth every 4 (four) hours as needed.   Yes Historical Provider, MD  insulin aspart (NOVOLOG) 100 UNIT/ML injection Inject 2 Units into the skin 3 (three) times daily before  meals.   Yes Historical Provider, MD  insulin glargine (LANTUS) 100 UNIT/ML injection Inject 12 Units into the skin at bedtime.    Yes Historical Provider, MD  isosorbide mononitrate (IMDUR) 60 MG 24 hr tablet Take 60 mg by mouth daily. For HTN   Yes Historical Provider, MD  nitroGLYCERIN (NITROSTAT) 0.4 MG SL tablet Place 0.4 mg under the tongue every 5 (five) minutes as needed for chest pain (May repeat for 3 doses as needed for chest pain. If chest pain persist call the MD.).   Yes Historical Provider, MD  ondansetron (ZOFRAN) 4 MG tablet Take 4 mg by mouth every 8 (eight) hours as needed for nausea or vomiting.   Yes Historical Provider, MD  oxyCODONE-acetaminophen (PERCOCET/ROXICET) 5-325 MG per tablet Take 1 tablet by mouth every 6 (six) hours as needed. For pain 03/01/14  Yes Claudie Revering, NP  pantoprazole (PROTONIX) 40 MG tablet Take 40 mg by mouth 2 (two) times daily. For esophageal reflux (6:30am & 4:30pm)   Yes Historical Provider, MD  sertraline (ZOLOFT) 100 MG tablet Take 100 mg by mouth daily. For mood   Yes Historical Provider, MD  guaifenesin (ROBITUSSIN) 100 MG/5ML syrup Take 200 mg by mouth every 4 (four) hours as needed for cough.    Historical Provider, MD  PRESCRIPTION MEDICATION Take 1 packet by mouth daily. Novosource (supplement)    Historical Provider, MD  PRESCRIPTION MEDICATION Apply 1 mg topically 2 (two) times daily as needed (anxiety/agitation). Lorazepam 1 mg/ml gel    Historical Provider, MD   BP 188/77  Pulse 59  Temp(Src) 96.4 F (35.8 C) (Rectal)  Resp 15  Ht 5' 6.14" (1.68 m)  Wt 145 lb 8.1 oz (66 kg)  BMI 23.38 kg/m2  SpO2 97% Physical Exam  Nursing note and vitals reviewed. Constitutional:  Chronically ill-appearing, lethargic, protecting airway  HENT:  Head: Normocephalic and atraumatic.  Eyes: Pupils are equal, round, and reactive to light.  Neck: Neck supple. JVD present.  Cardiovascular: Regular rhythm and normal heart sounds.   No murmur  heard. Bradycardia, frequent ectopy  Pulmonary/Chest: Effort normal and breath sounds normal. No respiratory distress. He has no wheezes.  Abdominal: Soft. Bowel sounds are normal. There is no tenderness. There is no rebound.  Musculoskeletal: He exhibits no edema.  Fistula in left upper extremity  Neurological: He is alert.  Disoriented, appears to moving all 4 extremities  Skin: Skin is warm and dry.  Bruising noted over fistula site of the left upper extremity  Psychiatric:  altered    ED Course  Procedures (including critical care time)  Angiocath insertion Performed by: Shon Baton  Consent: Verbal consent obtained. Risks and benefits: risks, benefits and alternatives were discussed Time out: Immediately prior to procedure a "time out" was called to verify the  correct patient, procedure, equipment, support staff and site/side marked as required.  Preparation: Patient was prepped and draped in the usual sterile fashion.  Vein Location: left EJ  Gauge: 20 g  Normal blood return and flush without difficulty Patient tolerance: Patient tolerated the procedure well with no immediate complications.  CRITICAL CARE Performed by: Shon Batonourtney F Sharnell Knight   Total critical care time: 45 min  Critical care time was exclusive of separately billable procedures and treating other patients.  Critical care was necessary to treat or prevent imminent or life-threatening deterioration.  Critical care was time spent personally by me on the following activities: development of treatment plan with patient and/or surrogate as well as nursing, discussions with consultants, evaluation of patient's response to treatment, examination of patient, obtaining history from patient or surrogate, ordering and performing treatments and interventions, ordering and review of laboratory studies, ordering and review of radiographic studies, pulse oximetry and re-evaluation of patient's condition.     Labs  Review Labs Reviewed  CBC WITH DIFFERENTIAL - Abnormal; Notable for the following:    RBC 2.79 (*)    Hemoglobin 8.9 (*)    HCT 27.1 (*)    Neutrophils Relative % 94 (*)    Lymphocytes Relative 5 (*)    Lymphs Abs 0.2 (*)    Monocytes Relative 1 (*)    Monocytes Absolute 0.0 (*)    All other components within normal limits  COMPREHENSIVE METABOLIC PANEL - Abnormal; Notable for the following:    Sodium 130 (*)    Potassium >7.7 (*)    Chloride 92 (*)    CO2 17 (*)    Glucose, Bld 243 (*)    BUN 138 (*)    Creatinine, Ser 12.25 (*)    Albumin 2.7 (*)    AST 216 (*)    ALT 141 (*)    Alkaline Phosphatase 247 (*)    GFR calc non Af Amer 3 (*)    GFR calc Af Amer 4 (*)    All other components within normal limits  URINALYSIS, ROUTINE W REFLEX MICROSCOPIC - Abnormal; Notable for the following:    APPearance CLOUDY (*)    Glucose, UA 100 (*)    Hgb urine dipstick SMALL (*)    Protein, ur >300 (*)    All other components within normal limits  URINE MICROSCOPIC-ADD ON - Abnormal; Notable for the following:    Bacteria, UA MANY (*)    All other components within normal limits  I-STAT CHEM 8, ED - Abnormal; Notable for the following:    Sodium 129 (*)    Potassium 7.5 (*)    BUN >140 (*)    Creatinine, Ser 12.80 (*)    Glucose, Bld 238 (*)    Hemoglobin 8.8 (*)    HCT 26.0 (*)    All other components within normal limits  I-STAT CG4 LACTIC ACID, ED - Abnormal; Notable for the following:    Lactic Acid, Venous 0.49 (*)    All other components within normal limits  CULTURE, BLOOD (ROUTINE X 2)  CULTURE, BLOOD (ROUTINE X 2)  URINE CULTURE  POTASSIUM  RENAL FUNCTION PANEL  CBC  CBC  CREATININE, SERUM  COMPREHENSIVE METABOLIC PANEL  CBC WITH DIFFERENTIAL  PROCALCITONIN    Imaging Review Dg Chest Port 1 View  03/14/2014   CLINICAL DATA:  Bradycardia, hypotension.  EXAM: PORTABLE CHEST - 1 VIEW  COMPARISON:  DG CHEST 2 VIEW dated 01/16/2014  FINDINGS: The cardiac silhouette  appears moderately  enlarged, similar. And mediastinal silhouette is nonsuspicious, mildly calcified. Mild central pulmonary vasculature congestion with decreased from prior examination with interval removal of dialysis catheter. No pleural effusions or focal consolidations though, the costophrenic angles are incompletely imaged. No pneumothorax.  Symmetric calcifications in the neck are likely vasculature. Multiple EKG lines overlie the patient and may obscure subtle underlying pathology. Soft tissue planes and included osseous structures are nonsuspicious.  IMPRESSION: Stable cardiomegaly, decreased central pulmonary vasculature congestion. Interval removal of dialysis catheter.   Electronically Signed   By: Awilda Metro   On: 03/14/2014 22:04     EKG Interpretation   Date/Time:  Tuesday Mar 14 2014 20:21:01 EDT Ventricular Rate:  68 PR Interval:  73 QRS Duration: 135 QT Interval:  497 QTC Calculation: 529 R Axis:   106 Text Interpretation:  Sinus rhythm Atrial premature complex Sinus pause  Short PR interval Consider left ventricular hypertrophy Prolonged QT  interval Confirmed by Wilkie Aye  MD, Donat Humble ([id number]) on 03/14/2014 8:48:01 PM      MDM   Final diagnoses:  Hyperkalemia  Uremia  Altered mental status    Patient presents with altered mental status and bradycardia. Given history of missed dialysis, concern for hyperkalemia. Patient has artery received calcium. EKG shows frequent ectopy and prolonged QT. Patient was given insulin, glucose, Kayexalate, and bicarbonate. Heart rate now in the 60s and sinus. No evidence of infection on exam but will obtain blood cultures and lactate. Lactate normal. Discuss with Dr. Eliott Nine. Nephrology who will evaluate the patient for emergent dialysis.  Patient will be admitted to the hospitalist service.    Shon Baton, MD 03/14/14 [phone]

## 2014-03-14 NOTE — ED Notes (Signed)
Pt from nursing home with c/o HR 26, pt refused two dialysis treatments. Pt also hypotensive. Pt received atropine and calcium en route.

## 2014-03-14 NOTE — Consult Note (Signed)
Requesting Physician:  [facility] EDP  Reason for Consult:  Pt with ESRD in need of urgent dialysis for hyperkalemia and severe uremia  HPI: The patient is a 73 y.o. year-old Asian man, dialysis patient of Dr's Lateef and Singh in [place] (according to [facility] SNF staff he goes to the [facility] on [address] - he is unable to give any history whatsoever). Per nursing home notes, he has a background of DM, HTN, , CAD, CM with EF of 45%.  Was staying at [facility] following a hosp at [facility] for pseudomonas sepsis (no details available other than "catheter related" so presume was HD cath related)  He is supposed to be dialyzed on TTS but refused at least his last 2 treatments according to SNF notes.  He was sent to the ED tonight because of bradycardia, hypotension. Labs were notable for a K of 7.5, CO2 of 19, BUN >140.  He was treated en route with atropine and calcium, then in the ED received Kayexelate, calcium, insulin and sodium bicarbonate. We are called to provide dialysis.   Patient is totally incoherent and there are no family members with him.  He has a fistula in his left upper arm that shows bruising and was apparently just created 01/16/14 and no other form of dialysis access.  Past Medical History  Diagnosis Date  . 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 Hypertension   . CAD (coronary artery disease)      with angioplasty  . 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 Cardiomyopathy     with ejection fraction of 45 %  . Heart failure, systolic and diastolic   . Anemia   . CHF (congestive heart failure)    Past Surgical History  Procedure Laterality Date  . Spine surgery      spinal fusion  . Av fistula placement Left 01/16/2014    Procedure: ARTERIOVENOUS (AV) FISTULA CREATION; ULTRASOUND GUIDED;  Surgeon: Larina Earthly, MD;  Location: [facility] OR;  Service: Vascular;  Laterality: Left;    Family History: History reviewed. No pertinent family history. Social History:  Unobtainable  Allergies  Allergen Reactions  . Lisinopril Other (See Comments)    Per MAR    Home medications: Prior to Admission medications   Medication Sig Start Date End Date Taking? Authorizing Provider  Amino Acids-Protein Hydrolys (FEEDING SUPPLEMENT, PRO-STAT SUGAR FREE 64,) LIQD Take 30 mLs by mouth 2 (two) times daily. 10am and 10pm   Yes Historical Provider, MD  aspirin 81 MG tablet Take 81 mg by mouth daily.   Yes Historical Provider, MD  atorvastatin (LIPITOR) 80 MG tablet Take 80 mg by mouth at bedtime. For hypercholesterolemia   Yes Historical Provider, MD  B Complex-C (SUPER B COMPLEX) TABS Take 1 tablet by mouth daily.    Yes Historical Provider, MD  calcium acetate (PHOSLO) 667 MG capsule Take 667-2,001 mg by mouth 4 (four) times daily - after meals and at bedtime. Take 3 capsules (2001 mg) 3 times daily with meals, take 1 capsule (667 mg) every night at bedtime with snack   Yes Historical Provider, MD  carvedilol (COREG) 25 MG tablet Take 25 mg by mouth 2 (two) times daily with a meal. For HTN (10am & 10pm)   Yes Historical Provider, MD  cefTAZidime (FORTAZ) 2 G injection Inject 2 g into the muscle 3 (three) times a week. Until 03/11/14 On Tuesday,thursday and Saturday at dialysis  Yes Historical Provider, MD  cloNIDine (CATAPRES - DOSED IN MG/24 HR) 0.3 mg/24hr patch Place 0.2 mg onto the skin once a week.   Yes Historical Provider, MD  clopidogrel (PLAVIX) 75 MG tablet Take 75 mg by mouth daily.    Yes Historical Provider, MD  ferrous sulfate 325 (65 FE) MG tablet Take 325 mg by mouth 3 (three) times daily with meals. 10am, 2pm, 10pm   Yes Historical Provider, MD  guaifenesin (ROBITUSSIN) 100 MG/5ML syrup Take 200 mg by mouth every 4 (four) hours as needed for cough.   Yes Historical Provider, MD  hydrALAZINE (APRESOLINE) 50 MG tablet Take 75 mg by mouth 3 (three) times daily. For HTN (10am, 2pm, 10pm)   Yes Historical Provider, MD  hydrOXYzine (ATARAX/VISTARIL) 25 MG  tablet Take 25 mg by mouth every 4 (four) hours as needed for anxiety.    Yes Historical Provider, MD  insulin aspart (NOVOLOG) 100 UNIT/ML injection Inject 2 Units into the skin 3 (three) times daily before meals.   Yes Historical Provider, MD  insulin glargine (LANTUS) 100 UNIT/ML injection Inject 12 Units into the skin at bedtime.    Yes Historical Provider, MD  isosorbide mononitrate (IMDUR) 60 MG 24 hr tablet Take 60 mg by mouth daily. For HTN   Yes Historical Provider, MD  nitroGLYCERIN (NITROSTAT) 0.4 MG SL tablet Place 0.4 mg under the tongue every 5 (five) minutes as needed for chest pain (May repeat for 3 doses as needed for chest pain. If chest pain persist call the MD.).   Yes Historical Provider, MD  ondansetron (ZOFRAN) 4 MG tablet Take 4 mg by mouth every 8 (eight) hours as needed for nausea or vomiting.   Yes Historical Provider, MD  oxyCODONE-acetaminophen (PERCOCET/ROXICET) 5-325 MG per tablet Take 1 tablet by mouth every 6 (six) hours as needed for severe pain (pain).   Yes Historical Provider, MD  pantoprazole (PROTONIX) 40 MG tablet Take 40 mg by mouth 2 (two) times daily. For esophageal reflux (6:30am & 4:30pm)   Yes Historical Provider, MD  PRESCRIPTION MEDICATION Apply 1 mg topically 2 (two) times daily as needed (anxiety/agitation). Lorazepam 1 mg/ml gel   Yes Historical Provider, MD  sertraline (ZOLOFT) 100 MG tablet Take 100 mg by mouth daily. For mood   Yes Historical Provider, MD  PRESCRIPTION MEDICATION Take 1 packet by mouth daily. Novosource (supplement)    Historical Provider, MD    Inpatient medications: None listed as of yet  Review of Systems Totally unobtainable as pt is incoherent and cannot answer any questions  Physical Exam:  Blood pressure 188/77, pulse 59, temperature 96.4 F (35.8 C), temperature source Rectal, resp. rate 15, SpO2 97.00%.  Gen: Encephalopathic Asian male.   Cannot answer questions but does awaken and open eyes, pulls covers up, and  says "cold" Skin: no rash, cyanosis Neck: no JVD, no bruits or LAN; left EJ line in place (placed in the ED) Chest: Clear Heart: regular, no rub or gallop Abdomen: soft, mild voluntary guarding Ext: No edema of the LE's; left upper arm AVF with good bruit and thrill; some bruising noted. Neuro: Awakens, cannot assess orientation Heme/Lymph: bruising over AVF left arm  Recent Labs Lab 03/14/14 2038 03/14/14 2103  NA 130* 129*  K >7.7* 7.5*  CL 92* 100  CO2 17*  --   GLUCOSE 243* 238*  BUN 138* >140*  CREATININE 12.25* 12.80*  CALCIUM 9.2  --    Recent Labs Lab 03/14/14 2038  AST 216*  ALT 141*  ALKPHOS 247*  BILITOT 0.5  PROT 6.2  ALBUMIN 2.7*   Recent Labs Lab 03/14/14 2038 03/14/14 2103  WBC 4.5  --   NEUTROABS 4.2  --   HGB 8.9* 8.8*  HCT 27.1* 26.0*  MCV 97.1  --   PLT 176  --    Xrays/Other Studies: Dg Chest Port 1 View  03/14/2014   CLINICAL DATA:  Bradycardia, hypotension.  EXAM: PORTABLE CHEST - 1 VIEW  COMPARISON:  DG CHEST 2 VIEW dated 01/16/2014  FINDINGS: The cardiac silhouette appears moderately enlarged, similar. And mediastinal silhouette is nonsuspicious, mildly calcified. Mild central pulmonary vasculature congestion with decreased from prior examination with interval removal of dialysis catheter. No pleural effusions or focal consolidations though, the costophrenic angles are incompletely imaged. No pneumothorax.  Symmetric calcifications in the neck are likely vasculature. Multiple EKG lines overlie the patient and may obscure subtle underlying pathology. Soft tissue planes and included osseous structures are nonsuspicious.  IMPRESSION: Stable cardiomegaly, decreased central pulmonary vasculature congestion. Interval removal of dialysis catheter.   Electronically Signed   By: Awilda Metroourtnay  Bloomer   On: 03/14/2014 22:04    Impression/Plan 1. ESRD - [facility] with no other treatment details available, but has missed at least 2 treatments  due to refusal; has a left upper arm AVF that is only 636 weeks old but apparently is being utilized as he has no catheter or other access.  I believe it can be cannulated wth 17 gauge needles.  Will HD tonight for 3.5 hours primarily for K.  Volume OK.  Will try to get additional information from his home HD unit in [place] in the AM 2. Life threatening hyperkalemia - acute HD tonight 3. Encephalopathy - presume much of this is uremic encephalopathy but have no idea of his underlying MS 4. Anemia - get info re EPO/iron from his HD unit 5. DM - per primary service 6. HTN - hypotensive in setting of hyperkalemia; hold meds 7. H/o pseudomonas sepsis "catheter related" - [facility] and then d/c'd to SNF  Unclear if treatment course was completed since presume he was to have rec'd IV ATB's at dialysis but wasn't going for treatments.  Would be prudent to obtain blood cultures for surveillance. Will draw on HD.  Camille Balynthia Ala Capri,  MD [facility] [phone] pager 03/14/2014, 10:41 PM

## 2014-03-15 DIAGNOSIS — N186 End stage renal disease: Secondary | ICD-10-CM

## 2014-03-15 DIAGNOSIS — E1129 Type 2 diabetes mellitus with other diabetic kidney complication: Secondary | ICD-10-CM

## 2014-03-15 DIAGNOSIS — G9340 Encephalopathy, unspecified: Secondary | ICD-10-CM | POA: Diagnosis present

## 2014-03-15 LAB — RENAL FUNCTION PANEL
ALBUMIN: 2.7 g/dL — AB (ref 3.5–5.2)
Albumin: 2.8 g/dL — ABNORMAL LOW (ref 3.5–5.2)
BUN: 136 mg/dL — ABNORMAL HIGH (ref 6–23)
BUN: 61 mg/dL — AB (ref 6–23)
CHLORIDE: 97 meq/L (ref 96–112)
CO2: 18 meq/L — AB (ref 19–32)
CO2: 23 mEq/L (ref 19–32)
CREATININE: 6.22 mg/dL — AB (ref 0.50–1.35)
Calcium: 8.8 mg/dL (ref 8.4–10.5)
Calcium: 8.5 mg/dL (ref 8.4–10.5)
Chloride: 101 mEq/L (ref 96–112)
Creatinine, Ser: 12.3 mg/dL — ABNORMAL HIGH (ref 0.50–1.35)
GFR calc Af Amer: 9 mL/min — ABNORMAL LOW (ref >90)
GFR calc non Af Amer: 3 mL/min — ABNORMAL LOW (ref >90)
GFR calc non Af Amer: 8 mL/min — ABNORMAL LOW (ref >90)
GFR, EST AFRICAN AMERICAN: 4 mL/min — AB (ref >90)
GLUCOSE: 100 mg/dL — AB (ref 70–99)
GLUCOSE: 118 mg/dL — AB (ref 70–99)
PHOSPHORUS: 5.2 mg/dL — AB (ref 2.3–4.6)
POTASSIUM: 3.9 meq/L (ref 3.7–5.3)
Phosphorus: 11.4 mg/dL — ABNORMAL HIGH (ref 2.3–4.6)
Potassium: 7 mEq/L (ref 3.7–5.3)
Sodium: 137 mEq/L (ref 137–147)
Sodium: 142 mEq/L (ref 137–147)

## 2014-03-15 LAB — CBC WITH DIFFERENTIAL/PLATELET
Basophils Absolute: 0 10*3/uL (ref 0.0–0.1)
Basophils Relative: 0 % (ref 0–1)
EOS PCT: 0 % (ref 0–5)
Eosinophils Absolute: 0 10*3/uL (ref 0.0–0.7)
HCT: 26.1 % — ABNORMAL LOW (ref 39.0–52.0)
HEMOGLOBIN: 8.7 g/dL — AB (ref 13.0–17.0)
LYMPHS ABS: 0.5 10*3/uL — AB (ref 0.7–4.0)
LYMPHS PCT: 9 % — AB (ref 12–46)
MCH: 32.1 pg (ref 26.0–34.0)
MCHC: 33.3 g/dL (ref 30.0–36.0)
MCV: 96.3 fL (ref 78.0–100.0)
MONOS PCT: 7 % (ref 3–12)
Monocytes Absolute: 0.4 10*3/uL (ref 0.1–1.0)
Neutro Abs: 4.7 10*3/uL (ref 1.7–7.7)
Neutrophils Relative %: 84 % — ABNORMAL HIGH (ref 43–77)
PLATELETS: 185 10*3/uL (ref 150–400)
RBC: 2.71 MIL/uL — AB (ref 4.22–5.81)
RDW: 14.1 % (ref 11.5–15.5)
WBC: 5.6 10*3/uL (ref 4.0–10.5)

## 2014-03-15 LAB — COMPREHENSIVE METABOLIC PANEL
ALBUMIN: 2.7 g/dL — AB (ref 3.5–5.2)
ALT: 122 U/L — ABNORMAL HIGH (ref 0–53)
AST: 127 U/L — AB (ref 0–37)
Alkaline Phosphatase: 223 U/L — ABNORMAL HIGH (ref 39–117)
BILIRUBIN TOTAL: 0.6 mg/dL (ref 0.3–1.2)
BUN: 60 mg/dL — AB (ref 6–23)
CALCIUM: 8.5 mg/dL (ref 8.4–10.5)
CHLORIDE: 98 meq/L (ref 96–112)
CO2: 21 mEq/L (ref 19–32)
CREATININE: 6.31 mg/dL — AB (ref 0.50–1.35)
GFR calc Af Amer: 9 mL/min — ABNORMAL LOW (ref >90)
GFR calc non Af Amer: 8 mL/min — ABNORMAL LOW (ref >90)
Glucose, Bld: 103 mg/dL — ABNORMAL HIGH (ref 70–99)
Potassium: 3.9 mEq/L (ref 3.7–5.3)
Sodium: 140 mEq/L (ref 137–147)
Total Protein: 6 g/dL (ref 6.0–8.3)

## 2014-03-15 LAB — HEMOGLOBIN A1C
HEMOGLOBIN A1C: 7.3 % — AB (ref <5.7)
MEAN PLASMA GLUCOSE: 163 mg/dL — AB (ref <117)

## 2014-03-15 LAB — HEPATITIS B CORE ANTIBODY, TOTAL: Hep B Core Total Ab: NONREACTIVE

## 2014-03-15 LAB — GLUCOSE, CAPILLARY
GLUCOSE-CAPILLARY: 89 mg/dL (ref 70–99)
GLUCOSE-CAPILLARY: 163 mg/dL — AB (ref 70–99)
Glucose-Capillary: 207 mg/dL — ABNORMAL HIGH (ref 70–99)

## 2014-03-15 LAB — MRSA PCR SCREENING: MRSA BY PCR: NEGATIVE

## 2014-03-15 LAB — HEPATITIS B SURFACE ANTIBODY,QUALITATIVE: Hep B S Ab: POSITIVE — AB

## 2014-03-15 LAB — HEPATITIS B SURFACE ANTIGEN: HEP B S AG: NEGATIVE

## 2014-03-15 LAB — AMMONIA: Ammonia: 18 umol/L (ref 11–60)

## 2014-03-15 MED ORDER — CALCIUM ACETATE 667 MG PO CAPS
667.0000 mg | ORAL_CAPSULE | Freq: Every day | ORAL | Status: DC
  Administered 2014-03-15 – 2014-03-17 (×3): 667 mg via ORAL
  Filled 2014-03-15 (×6): qty 1

## 2014-03-15 MED ORDER — VANCOMYCIN HCL 500 MG IV SOLR
500.0000 mg | Freq: Once | INTRAVENOUS | Status: AC
  Administered 2014-03-15: 500 mg via INTRAVENOUS
  Filled 2014-03-15: qty 500

## 2014-03-15 MED ORDER — INSULIN ASPART 100 UNIT/ML ~~LOC~~ SOLN
0.0000 [IU] | Freq: Every day | SUBCUTANEOUS | Status: DC
Start: 2014-03-15 — End: 2014-03-20
  Administered 2014-03-15: 2 [IU] via SUBCUTANEOUS

## 2014-03-15 MED ORDER — INSULIN ASPART 100 UNIT/ML ~~LOC~~ SOLN
0.0000 [IU] | Freq: Three times a day (TID) | SUBCUTANEOUS | Status: DC
  Administered 2014-03-16: 2 [IU] via SUBCUTANEOUS
  Administered 2014-03-16 – 2014-03-17 (×2): 1 [IU] via SUBCUTANEOUS
  Administered 2014-03-17 – 2014-03-18 (×2): 2 [IU] via SUBCUTANEOUS
  Administered 2014-03-18: 1 [IU] via SUBCUTANEOUS
  Administered 2014-03-19: 2 [IU] via SUBCUTANEOUS
  Administered 2014-03-19: 1 [IU] via SUBCUTANEOUS

## 2014-03-15 MED ORDER — ASPIRIN 81 MG PO CHEW
81.0000 mg | CHEWABLE_TABLET | Freq: Every day | ORAL | Status: DC
  Administered 2014-03-17 – 2014-03-18 (×2): 81 mg via ORAL
  Filled 2014-03-15 (×4): qty 1

## 2014-03-15 MED ORDER — DEXTROSE 5 % IV SOLN
2.0000 g | INTRAVENOUS | Status: DC
  Administered 2014-03-16: 2 g via INTRAVENOUS
  Filled 2014-03-15 (×3): qty 2

## 2014-03-15 MED ORDER — INSULIN GLARGINE 100 UNIT/ML ~~LOC~~ SOLN
6.0000 [IU] | Freq: Every day | SUBCUTANEOUS | Status: DC
  Administered 2014-03-15 – 2014-03-18 (×4): 6 [IU] via SUBCUTANEOUS
  Filled 2014-03-15 (×6): qty 0.06

## 2014-03-15 MED ORDER — PIPERACILLIN-TAZOBACTAM IN DEX 2-0.25 GM/50ML IV SOLN
2.2500 g | Freq: Three times a day (TID) | INTRAVENOUS | Status: DC
  Filled 2014-03-15 (×2): qty 50

## 2014-03-15 MED ORDER — B COMPLEX-C PO TABS
1.0000 | ORAL_TABLET | Freq: Every day | ORAL | Status: DC
  Administered 2014-03-17 – 2014-03-19 (×3): 1 via ORAL
  Filled 2014-03-15 (×6): qty 1

## 2014-03-15 NOTE — Progress Notes (Signed)
Pt refusing all PM medications

## 2014-03-15 NOTE — Progress Notes (Signed)
ANTIBIOTIC CONSULT NOTE - INITIAL  Pharmacy Consult for Vancomycin and Zosyn  Indication: rule out sepsis  Allergies  Allergen Reactions  . Lisinopril Other (See Comments)    Per [facility]    Patient Measurements: Height: 5' 6.14" (168 cm) Weight: 145 lb 8.1 oz (66 kg) IBW/kg (Calculated) : 64.13  Vital Signs: Temp: 97.2 F (36.2 C) (05/05 2335) Temp src: Oral (05/05 2335) BP: 197/87 mmHg (05/06 0300) Pulse Rate: 80 (05/06 0300) Intake/Output from previous day: 05/05 0701 - 05/06 0700 In: -  Out: 25 [Urine:25] Intake/Output from this shift: Total I/O In: -  Out: 25 [Urine:25]  Labs:  Recent Labs  03/14/14 2038 03/14/14 2103 03/14/14 2252 03/14/14 2344  WBC 4.5  --  4.3  --   HGB 8.9* 8.8* 8.9*  --   PLT 176  --  179  --   CREATININE 12.25* 12.80* 12.10* 12.30*   Estimated Creatinine Clearance: 4.8 ml/min (by C-G formula based on Cr of 12.3). No results found for this basename: VANCOTROUGH, VANCOPEAK, VANCORANDOM, GENTTROUGH, GENTPEAK, GENTRANDOM, TOBRATROUGH, TOBRAPEAK, TOBRARND, AMIKACINPEAK, AMIKACINTROU, AMIKACIN,  in the last 72 hours   Microbiology: No results found for this or any previous visit (from the past 720 hour(s)).  Medical History: Past Medical History  Diagnosis Date  . 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 Hypertension   . CAD (coronary artery disease)      with angioplasty  . 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 Cardiomyopathy     with ejection fraction of 45 %  . Heart failure, systolic and diastolic   . Anemia   . CHF (congestive heart failure)     Medications:  ASA  Lipitor  PhosLo  Coreg  Clonidine  Plavix  Iron  Hydralazine  Lantus  Novolog  Protonix  Ativan  Zoloft Fortaz  Assessment: 73 yo male with recent Pseudomonal bacteremia, possible sepsis, for empiric antibiotics.  Vancomycin 1 g IV given in HD at 0330  Goal of Therapy:  Vancomycin pre-HD level 15-20  Plan:  Will give additional 500  mg IV vancomycin for 1500 mg total this morning   Zosyn 3.375 g IV now in ED, then 2.25 g IV q8h  Gary Fleet Frederich Montilla 03/15/2014,4:04 AM

## 2014-03-15 NOTE — ED Notes (Signed)
Verbal order from pharmacy, pharmacy tech and Lequita Halt charge RN to hold ALL medications other than antibiotics due to meds not given before dialysis and not being verified. Pharmacy stated that the times will adjust and the schedule will be correct starting at 10 am this morning.  ALL meds other than antibiotics placed on hold.

## 2014-03-15 NOTE — Progress Notes (Signed)
Contacted [employer] and spoke with operator [phone].  Call initiated r/t to patient refusal of medications and unable to do a full mental assessment as english is his second language. Dr. Butler Denmark present for most of call and she attempted to provide education r/t to why he was in the hospital and that if he continues to miss dialysis he could die.  Patient reports that he does not want dialysis anymore and wants to die.  I informed Dr. Butler Denmark that it was reported patient was removing HD needles during treatment which is in line with his reports of not wanting HD anymore.  Patient reported there was no family we could call and when asked about contact listed in the medical record he informed us we did not need to call her.  Patient continued to refuse medications and thanked me when I told him they would not be forced on him.  I had the interpreter explain that we could call for assistance at anytime he needed to communicate with [place].  Dr. Butler Denmark aware that patient is refusing medications at this time.   Jacqulyn Cane RN, BSN, CCRN

## 2014-03-15 NOTE — Progress Notes (Signed)
Patient daughter in law Arbie Cookey returned my message and reported the SNF [facility] did contact her over night.  She reports she was not aware of the severity of this admission.  I provided a brief update of what led to the admission and informed her that at this time Abbas was refusing all care, medications, and dialysis.  She reports she will contact family and come to the hospital.  Jacqulyn Cane RN, BSN, CCRN

## 2014-03-15 NOTE — Progress Notes (Signed)
10:18AM contacted [facility] and spoke with Merri Ray.  She reports that patient has been refusing medication at the facility for sometime.  She provided me with a phone number for the patients son Leopold Essinger [phone] which was disconnected on 2 attempts I made to contact. She also gave me a phone # for Arbie Cookey listed as a daughter in law of [phone].  I left a message at the 2nd number.    Jacqulyn Cane RN, BSN, CCRN

## 2014-03-15 NOTE — Progress Notes (Signed)
[facility] Progress Note   Albert Grant EXB:[id number] DOB: 02/25/1941 DOA: 03/14/2014 PCP: No PCP Per Patient  Brief narrative: Albert Grant is a 73 y.o. male presenting on 03/14/2014 with ESRD non-compliant with dialysis, recent pseudomonas bacteremia, DM2 presenting with encephalopathy after refusing dialysis x 2.    Subjective: Alert, no complaint other than not being able to walk for the past 2 yrs. Seems irritated by me talking to him. Having to use the interpretor line. Pt not aware of why he is in the hospital and thinks he was brought her because his leg do not work properly. After being repeatedly told that he is here because he was skipping his dialysis sessions he is still not able to tell me why he is in the hospital. When asked about whether he wants to continue dialysis he states "I will see" and with further questioning, he becomes irritated and states "I want to die right now".   Assessment/Plan: Principal Problem:   Acute encephalopathy - resolving with dialysis  Active Problems:   End stage renal disease - cont dialysis for now- he will receive a second treatment today - have asked palliative care to address GOC and whether he wants to continue on dialysis    Hyperkalemia - corrected with daily     Pseudomonas sepsis - cont Fortaz with dialysis no stop date?    DM type 2 causing renal disease - will place on sliding scale   Code Status: Full code Family Communication: none [facility] Plan[facility]  Consultants: nephrology  Procedures: none  Antibiotics: Antibiotics Given (last 72 hours)   None       DVT prophylaxis: Heparin  Objective: Filed Weights   03/14/14 2230 03/15/14 0800  Weight: 66 kg (145 lb 8.1 oz) 62.4 kg (137 lb 9.1 oz)   Blood pressure 186/72, pulse 79, temperature 97.9 F (36.6 C), temperature source Oral, resp. rate 13, height 5\' 6"  (1.676 m), weight 62.4 kg (137 lb 9.1 oz), SpO2 96.00%.  Intake/Output  Summary (Last 24 hours) at 03/15/14 1057 Last data filed at 03/15/14 0430  Gross per 24 hour  Intake      0 ml  Output   1025 ml  Net  -1025 ml     Exam: General: No acute respiratory distress Lungs: Clear to auscultation bilaterally without wheezes or crackles Cardiovascular: Regular rate and rhythm without murmur gallop or rub normal S1 and S2 Abdomen: Nontender, nondistended, soft, bowel sounds positive, no rebound, no ascites, no appreciable mass Extremities: No significant cyanosis, clubbing, or edema bilateral lower extremities  Data Reviewed: Basic Metabolic Panel:  Recent Labs Lab 03/14/14 2038 03/14/14 2103 03/14/14 2252 03/14/14 2344 03/15/14 0617  NA 130* 129*  --  137 142  140  K >7.7* 7.5* 6.9* 7.0* 3.9  3.9  CL 92* 100  --  97 101  98  CO2 17*  --   --  18* 23  21  GLUCOSE 243* 238*  --  118* 100*  103*  BUN 138* >140*  --  136* 61*  60*  CREATININE 12.25* 12.80* 12.10* 12.30* 6.22*  6.31*  CALCIUM 9.2  --   --  8.8 8.5  8.5  PHOS  --   --   --  11.4* 5.2*   Liver Function Tests:  Recent Labs Lab 03/14/14 2038 03/14/14 2344 03/15/14 0617  AST 216*  --  127*  ALT 141*  --  122*  ALKPHOS 247*  --  223*  BILITOT 0.5  --  0.6  PROT 6.2  --  6.0  ALBUMIN 2.7* 2.8* 2.7*  2.7*   No results found for this basename: LIPASE, AMYLASE,  in the last 168 hours  Recent Labs Lab 03/15/14 0100  AMMONIA 18   CBC:  Recent Labs Lab 03/14/14 2038 03/14/14 2103 03/14/14 2252 03/15/14 0617  WBC 4.5  --  4.3 5.6  NEUTROABS 4.2  --   --  4.7  HGB 8.9* 8.8* 8.9* 8.7*  HCT 27.1* 26.0* 26.8* 26.1*  MCV 97.1  --  96.1 96.3  PLT 176  --  179 185   Cardiac Enzymes: No results found for this basename: CKTOTAL, CKMB, CKMBINDEX, TROPONINI,  in the last 168 hours BNP (last 3 results) No results found for this basename: PROBNP,  in the last 8760 hours CBG:  Recent Labs Lab 03/15/14 0815  GLUCAP 89    Recent Results (from the past 240 hour(s))   MRSA PCR SCREENING     Status: None   Collection Time    03/15/14  8:18 AM      Result Value Ref Range Status   MRSA by PCR NEGATIVE  NEGATIVE Final   Comment:            The GeneXpert MRSA Assay (FDA     approved for NASAL specimens     only), is one component of a     comprehensive MRSA colonization     surveillance program. It is not     intended to diagnose MRSA     infection nor to guide or     monitor treatment for     MRSA infections.     Studies:  Recent x-ray studies have been reviewed in detail by the Attending Physician  Scheduled Meds:  Scheduled Meds: . aspirin  81 mg Oral Daily  . atorvastatin  80 mg Oral QHS  . B-complex with vitamin C  1 tablet Oral Daily  . calcium acetate  2,001 mg Oral TID WC  . calcium acetate  667 mg Oral QHS  . carvedilol  25 mg Oral BID WC  . cloNIDine  0.2 mg Transdermal Weekly  . clopidogrel  75 mg Oral Daily  . feeding supplement (PRO-STAT SUGAR FREE 64)  30 mL Oral BID  . ferrous sulfate  325 mg Oral TID WC  . heparin  5,000 Units Subcutaneous 3 times per day  . hydrALAZINE  75 mg Oral TID  . insulin glargine  6 Units Subcutaneous QHS  . isosorbide mononitrate  60 mg Oral Daily  . pantoprazole  40 mg Oral BID  . piperacillin-tazobactam (ZOSYN)  IV  2.25 g Intravenous 3 times per day  . sertraline  100 mg Oral Daily   Continuous Infusions:   Time spent on care of this patient: >35 min   [name], MD 03/15/2014, 10:57 AM  LOS: 1 day   [facility] Office  [phone] Pager - Text Page per Loretha Stapler   If 7PM-7AM, please contact night-coverage [email or web address]
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
## 2014-03-15 NOTE — Progress Notes (Signed)
Impression/Plan  1. ESRD - TTS HD at [facility]; non adherence to therapy.  Plan another treatment AM 5/7  2. Life threatening hyperkalemia -resolved after dialysis 3. Encephalopathy - presume much of this is uremic encephalopathy but have no idea of his underlying MS v language barrier 4. Anemia - get info re EPO/iron from his HD unit 5.   H/o pseudomonas sepsis "catheter related" - treated at [facility] and then d/c'd to SNF   Subjective: Interval History: HD 3.5 hours last PM. 3 Objective: Vital signs in last 24 hours: Temp:  [96.4 F (35.8 C)-98.8 F (37.1 C)] 97.9 F (36.6 C) (05/06 0745) Pulse Rate:  [46-98] 80 (05/06 0850) Resp:  [13-32] 13 (05/06 0850) BP: (152-209)/(59-108) 186/71 mmHg (05/06 0835) SpO2:  [92 %-100 %] 98 % (05/06 0850) Weight:  [62.4 kg (137 lb 9.1 oz)-66 kg (145 lb 8.1 oz)] 62.4 kg (137 lb 9.1 oz) (05/06 0800) Weight change:   Intake/Output from previous day: 05/05 0701 - 05/06 0700 In: -  Out: 1025 [Urine:25] Intake/Output this shift:    General appearance: alert Resp: clear to auscultation bilaterally Chest wall: no tenderness Cardio: regular rate and rhythm, S1, S2 normal, no murmur, click, rub or gallop LUE AVF  Lab Results:  Recent Labs  03/14/14 2252 03/15/14 0617  WBC 4.3 5.6  HGB 8.9* 8.7*  HCT 26.8* 26.1*  PLT 179 185   BMET:  Recent Labs  03/14/14 2344 03/15/14 0617  NA 137 142  140  K 7.0* 3.9  3.9  CL 97 101  98  CO2 18* 23  21  GLUCOSE 118* 100*  103*  BUN 136* 61*  60*  CREATININE 12.30* 6.22*  6.31*  CALCIUM 8.8 8.5  8.5   No results found for this basename: PTH,  in the last 72 hours Iron Studies: No results found for this basename: IRON, TIBC, TRANSFERRIN, FERRITIN,  in the last 72 hours Studies/Results: Dg Chest Port 1 View  03/14/2014   CLINICAL DATA:  Bradycardia, hypotension.  EXAM: PORTABLE CHEST - 1 VIEW  COMPARISON:  DG CHEST 2 VIEW dated 01/16/2014  FINDINGS: The cardiac silhouette appears  moderately enlarged, similar. And mediastinal silhouette is nonsuspicious, mildly calcified. Mild central pulmonary vasculature congestion with decreased from prior examination with interval removal of dialysis catheter. No pleural effusions or focal consolidations though, the costophrenic angles are incompletely imaged. No pneumothorax.  Symmetric calcifications in the neck are likely vasculature. Multiple EKG lines overlie the patient and may obscure subtle underlying pathology. Soft tissue planes and included osseous structures are nonsuspicious.  IMPRESSION: Stable cardiomegaly, decreased central pulmonary vasculature congestion. Interval removal of dialysis catheter.   Electronically Signed   By: Awilda Metro   On: 03/14/2014 22:04   Scheduled: . aspirin  81 mg Oral Daily  . atorvastatin  80 mg Oral QHS  . B-complex with vitamin C  1 tablet Oral Daily  . calcium acetate  2,001 mg Oral TID WC  . calcium acetate  667 mg Oral QHS  . carvedilol  25 mg Oral BID WC  . cloNIDine  0.2 mg Transdermal Weekly  . clopidogrel  75 mg Oral Daily  . feeding supplement (PRO-STAT SUGAR FREE 64)  30 mL Oral BID  . ferrous sulfate  325 mg Oral TID WC  . heparin  5,000 Units Subcutaneous 3 times per day  . hydrALAZINE  75 mg Oral TID  . insulin glargine  6 Units Subcutaneous QHS  . isosorbide mononitrate  60 mg  Oral Daily  . pantoprazole  40 mg Oral BID  . piperacillin-tazobactam (ZOSYN)  IV  2.25 g Intravenous 3 times per day  . sertraline  100 mg Oral Daily     LOS: 1 day   Albert Grant 03/15/2014,9:29 AM

## 2014-03-15 NOTE — Progress Notes (Signed)
ANTIBIOTIC CONSULT NOTE - INITIAL  Pharmacy Consult for [facility] Indication: recent pseudomonal bacteremia  Allergies  Allergen Reactions  . Lisinopril Other (See Comments)    Per [facility]    Patient Measurements: Height: 5\' 6"  (167.6 cm) Weight: 137 lb 9.1 oz (62.4 kg) IBW/kg (Calculated) : 63.8 Vital Signs: Temp: 97.9 F (36.6 C) (05/06 0745) Temp src: Oral (05/06 0745) BP: 186/72 mmHg (05/06 1000) Pulse Rate: 79 (05/06 1000) Intake/Output from previous day: 05/05 0701 - 05/06 0700 In: -  Out: 1025 [Urine:25] Labs:  Recent Labs  03/14/14 2038 03/14/14 2103 03/14/14 2252 03/14/14 2344 03/15/14 0617  WBC 4.5  --  4.3  --  5.6  HGB 8.9* 8.8* 8.9*  --  8.7*  PLT 176  --  179  --  185  CREATININE 12.25* 12.80* 12.10* 12.30* 6.22*  6.31*   Estimated Creatinine Clearance: 9.2 ml/min (by C-G formula based on Cr of 6.22).   Microbiology: Recent Results (from the past 720 hour(s))  MRSA PCR SCREENING     Status: None   Collection Time    03/15/14  8:18 AM      Result Value Ref Range Status   MRSA by PCR NEGATIVE  NEGATIVE Final   Comment:            The GeneXpert MRSA Assay (FDA     approved for NASAL specimens     only), is one component of a     comprehensive MRSA colonization     surveillance program. It is not     intended to diagnose MRSA     infection nor to guide or     monitor treatment for     MRSA infections.    Medical History: Past Medical History  Diagnosis Date  . 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 Hypertension   . CAD (coronary artery disease)      with angioplasty  . 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 Cardiomyopathy     with ejection fraction of 45 %  . Heart failure, systolic and diastolic   . Anemia   . CHF (congestive heart failure)    Assessment: 72 YOM on vancomycin and Zosyn for r/o sepsis now to change back to [facility] (receiving for pseudomonas bacteremia) that he was receiving with dialysis.  Patient with noted non-compliance with dialysis. Home dialysis schedule was T-Th-Sat. HD today x3.5 hours at BFR of 300 (completed at [facility]) Plan for next HD on 5/7.    Received Zosyn 3.375g IV this AM post HD.   Goal of Therapy:  Clinical resolution of infection  Plan:  1. Fortaz 2g IV post-HD T-Th-S (1st dose tomorrow).  Link Snuffer, PharmD, BCPS Clinical Pharmacist [phone] 03/15/2014,11:35 AM

## 2014-03-15 NOTE — Progress Notes (Signed)
I made contact with Dr. Lowell Guitar to inform him of patient reporting wanting to refuse medications and dialysis at this time  Jacqulyn Cane RN, BSN, CCRN

## 2014-03-15 NOTE — Progress Notes (Signed)
Son came in for a visit. Verified that patient is confused. Contact info provided. To call him if theres a problem with HD tomorrow.

## 2014-03-16 DIAGNOSIS — D631 Anemia in chronic kidney disease: Secondary | ICD-10-CM | POA: Insufficient documentation

## 2014-03-16 DIAGNOSIS — N189 Chronic kidney disease, unspecified: Secondary | ICD-10-CM

## 2014-03-16 DIAGNOSIS — R110 Nausea: Secondary | ICD-10-CM | POA: Insufficient documentation

## 2014-03-16 LAB — COMPREHENSIVE METABOLIC PANEL
ALBUMIN: 2.4 g/dL — AB (ref 3.5–5.2)
ALT: 89 U/L — ABNORMAL HIGH (ref 0–53)
AST: 60 U/L — AB (ref 0–37)
Alkaline Phosphatase: 183 U/L — ABNORMAL HIGH (ref 39–117)
BILIRUBIN TOTAL: 0.5 mg/dL (ref 0.3–1.2)
BUN: 74 mg/dL — ABNORMAL HIGH (ref 6–23)
CO2: 23 mEq/L (ref 19–32)
CREATININE: 8.25 mg/dL — AB (ref 0.50–1.35)
Calcium: 7.8 mg/dL — ABNORMAL LOW (ref 8.4–10.5)
Chloride: 99 mEq/L (ref 96–112)
GFR calc Af Amer: 7 mL/min — ABNORMAL LOW (ref >90)
GFR calc non Af Amer: 6 mL/min — ABNORMAL LOW (ref >90)
Glucose, Bld: 145 mg/dL — ABNORMAL HIGH (ref 70–99)
Potassium: 4.5 mEq/L (ref 3.7–5.3)
Sodium: 140 mEq/L (ref 137–147)
TOTAL PROTEIN: 5.9 g/dL — AB (ref 6.0–8.3)

## 2014-03-16 LAB — CBC WITH DIFFERENTIAL/PLATELET
Basophils Absolute: 0.1 10*3/uL (ref 0.0–0.1)
Basophils Relative: 1 % (ref 0–1)
EOS PCT: 4 % (ref 0–5)
Eosinophils Absolute: 0.2 10*3/uL (ref 0.0–0.7)
HEMATOCRIT: 25.9 % — AB (ref 39.0–52.0)
HEMOGLOBIN: 8.3 g/dL — AB (ref 13.0–17.0)
LYMPHS ABS: 0.6 10*3/uL — AB (ref 0.7–4.0)
LYMPHS PCT: 13 % (ref 12–46)
MCH: 31.6 pg (ref 26.0–34.0)
MCHC: 32 g/dL (ref 30.0–36.0)
MCV: 98.5 fL (ref 78.0–100.0)
MONOS PCT: 10 % (ref 3–12)
Monocytes Absolute: 0.4 10*3/uL (ref 0.1–1.0)
Neutro Abs: 3.1 10*3/uL (ref 1.7–7.7)
Neutrophils Relative %: 72 % (ref 43–77)
PLATELETS: 186 10*3/uL (ref 150–400)
RBC: 2.63 MIL/uL — AB (ref 4.22–5.81)
RDW: 14.3 % (ref 11.5–15.5)
WBC: 4.2 10*3/uL (ref 4.0–10.5)

## 2014-03-16 LAB — URINE CULTURE
CULTURE: NO GROWTH
Colony Count: NO GROWTH

## 2014-03-16 LAB — GLUCOSE, CAPILLARY
GLUCOSE-CAPILLARY: 185 mg/dL — AB (ref 70–99)
GLUCOSE-CAPILLARY: 158 mg/dL — AB (ref 70–99)
Glucose-Capillary: 126 mg/dL — ABNORMAL HIGH (ref 70–99)

## 2014-03-16 LAB — PROCALCITONIN: Procalcitonin: 2.24 ng/mL

## 2014-03-16 MED ORDER — LIDOCAINE-PRILOCAINE 2.5-2.5 % EX CREA: 1.0000 "application " | TOPICAL_CREAM | CUTANEOUS | Status: DC | PRN

## 2014-03-16 MED ORDER — SODIUM CHLORIDE 0.9 % IV SOLN: 100.0000 mL | INTRAVENOUS | Status: DC | PRN

## 2014-03-16 MED ORDER — LIDOCAINE HCL (PF) 1 % IJ SOLN: 5.0000 mL | INTRAMUSCULAR | Status: DC | PRN

## 2014-03-16 MED ORDER — LABETALOL HCL 5 MG/ML IV SOLN
10.0000 mg | INTRAVENOUS | Status: DC | PRN
  Administered 2014-03-17 – 2014-03-18 (×4): 10 mg via INTRAVENOUS
  Filled 2014-03-16 (×4): qty 4

## 2014-03-16 MED ORDER — HEPARIN SODIUM (PORCINE) 1000 UNIT/ML DIALYSIS: 20.0000 [IU]/kg | INTRAMUSCULAR | Status: DC | PRN

## 2014-03-16 MED ORDER — PENTAFLUOROPROP-TETRAFLUOROETH EX AERO: 1.0000 "application " | INHALATION_SPRAY | CUTANEOUS | Status: DC | PRN

## 2014-03-16 MED ORDER — NEPRO/CARBSTEADY PO LIQD: 237.0000 mL | ORAL | Status: DC | PRN

## 2014-03-16 MED ORDER — HEPARIN SODIUM (PORCINE) 1000 UNIT/ML DIALYSIS: 1000.0000 [IU] | INTRAMUSCULAR | Status: DC | PRN

## 2014-03-16 MED ORDER — ALTEPLASE 2 MG IJ SOLR
2.0000 mg | Freq: Once | INTRAMUSCULAR | Status: DC | PRN
  Filled 2014-03-16: qty 2

## 2014-03-16 NOTE — Procedures (Signed)
Calm and sleeping during hemodialysis.  Some arterial access issues.  Stable hemodynamics Lauris Poag

## 2014-03-16 NOTE — Progress Notes (Signed)
[facility] Progress Note   Albert Grant ZOX:[id number] DOB: 02/07/1941 DOA: 03/14/2014 PCP: No PCP Per Patient  Brief narrative: Albert Grant is a 73 y.o. male presenting on 03/14/2014 with ESRD non-compliant with dialysis, recent pseudomonas bacteremia, DM2 presenting with encephalopathy after refusing dialysis x 2.    Subjective: Alert, no complaints- evaluated on dialysis.   Assessment/Plan: Principal Problem:   Acute encephalopathy - resolving with dialysis  Active Problems:   End stage renal disease - cont dialysis for now- he will receive a second treatment today - have asked palliative care to address GOC and whether he wants to continue on dialysis    Hyperkalemia - corrected with daily     Pseudomonas sepsis - cont Fortaz with dialysis no stop date?    DM type 2 causing renal disease - will place on sliding scale   Code Status: Full code Family Communication: none Disposition Plan: return to NH once palliative meeting complete  Consultants: nephrology  Procedures: none  Antibiotics: Antibiotics Given (last 72 hours)   Date/Time Action Medication Dose Rate   03/16/14 1026 Given  [given for HD]   cefTAZidime (FORTAZ) 2 g in dextrose 5 % 50 mL IVPB 2 g 100 mL/hr       DVT prophylaxis: Heparin  Objective: Filed Weights   03/15/14 2117 03/16/14 0714 03/16/14 1115  Weight: 63.89 kg (140 lb 13.6 oz) 63.1 kg (139 lb 1.8 oz) 61.7 kg (136 lb 0.4 oz)   Blood pressure 206/87, pulse 72, temperature 97.7 F (36.5 C), temperature source Oral, resp. rate 19, height 5\' 6"  (1.676 m), weight 61.7 kg (136 lb 0.4 oz), SpO2 95.00%.  Intake/Output Summary (Last 24 hours) at 03/16/14 1414 Last data filed at 03/16/14 1115  Gross per 24 hour  Intake    240 ml  Output   1400 ml  Net  -1160 ml     Exam: General: No acute respiratory distress Lungs: Clear to auscultation bilaterally without wheezes or crackles Cardiovascular: Regular rate and rhythm without  murmur gallop or rub normal S1 and S2 Abdomen: Nontender, nondistended, soft, bowel sounds positive, no rebound, no ascites, no appreciable mass Extremities: No significant cyanosis, clubbing, or edema bilateral lower extremities  Data Reviewed: Basic Metabolic Panel:  Recent Labs Lab 03/14/14 2038 03/14/14 2103 03/14/14 2252 03/14/14 2344 03/15/14 0617 03/16/14 0540  NA 130* 129*  --  137 142  140 140  K >7.7* 7.5* 6.9* 7.0* 3.9  3.9 4.5  CL 92* 100  --  97 101  98 99  CO2 17*  --   --  18* 23  21 23   GLUCOSE 243* 238*  --  118* 100*  103* 145*  BUN 138* >140*  --  136* 61*  60* 74*  CREATININE 12.25* 12.80* 12.10* 12.30* 6.22*  6.31* 8.25*  CALCIUM 9.2  --   --  8.8 8.5  8.5 7.8*  PHOS  --   --   --  11.4* 5.2*  --    Liver Function Tests:  Recent Labs Lab 03/14/14 2038 03/14/14 2344 03/15/14 0617 03/16/14 0540  AST 216*  --  127* 60*  ALT 141*  --  122* 89*  ALKPHOS 247*  --  223* 183*  BILITOT 0.5  --  0.6 0.5  PROT 6.2  --  6.0 5.9*  ALBUMIN 2.7* 2.8* 2.7*  2.7* 2.4*   No results found for this basename: LIPASE, AMYLASE,  in the last 168 hours  Recent Labs Lab 03/15/14 0100  AMMONIA 18   CBC:  Recent Labs Lab 03/14/14 2038 03/14/14 2103 03/14/14 2252 03/15/14 0617 03/16/14 0540  WBC 4.5  --  4.3 5.6 4.2  NEUTROABS 4.2  --   --  4.7 3.1  HGB 8.9* 8.8* 8.9* 8.7* 8.3*  HCT 27.1* 26.0* 26.8* 26.1* 25.9*  MCV 97.1  --  96.1 96.3 98.5  PLT 176  --  179 185 186   Cardiac Enzymes: No results found for this basename: CKTOTAL, CKMB, CKMBINDEX, TROPONINI,  in the last 168 hours BNP (last 3 results) No results found for this basename: PROBNP,  in the last 8760 hours CBG:  Recent Labs Lab 03/15/14 0815 03/15/14 1645 03/15/14 2116 03/16/14 1209  GLUCAP 89 163* 207* 126*    Recent Results (from the past 240 hour(s))  CULTURE, BLOOD (ROUTINE X 2)     Status: None   Collection Time    03/14/14  8:48 PM      Result Value Ref Range Status    Specimen Description BLOOD EJ   Final   Special Requests BOTTLES DRAWN AEROBIC AND ANAEROBIC 10CC EACH   Final   Culture  Setup Time     Final   Value: 03/15/2014 01:43     Performed at [facility]   Culture     Final   Value:        BLOOD CULTURE RECEIVED NO GROWTH TO DATE CULTURE WILL BE HELD FOR 5 DAYS BEFORE ISSUING A FINAL NEGATIVE REPORT     Performed at [facility]   Report Status PENDING   Incomplete  CULTURE, BLOOD (ROUTINE X 2)     Status: None   Collection Time    03/14/14  8:53 PM      Result Value Ref Range Status   Specimen Description BLOOD ARM RIGHT   Final   Special Requests BOTTLES DRAWN AEROBIC AND ANAEROBIC 10CC   Final   Culture  Setup Time     Final   Value: 03/15/2014 01:43     Performed at [facility]   Culture     Final   Value: GRAM POSITIVE COCCI IN CLUSTERS     Note: Gram Stain Report Called to,Read Back By and Verified With: EMMANUEL CASTRO ON 03/15/2014 AT 8:20P BY WILEJ     Performed at [facility]   Report Status PENDING   Incomplete  URINE CULTURE     Status: None   Collection Time    03/14/14  9:11 PM      Result Value Ref Range Status   Specimen Description URINE, CATHETERIZED   Final   Special Requests NONE   Final   Culture  Setup Time     Final   Value: 03/15/2014 04:38     Performed at [facility] Count     Final   Value: NO GROWTH     Performed at [facility]   Culture     Final   Value: NO GROWTH     Performed at [facility]   Report Status 03/16/2014 FINAL   Final  MRSA PCR SCREENING     Status: None   Collection Time    03/15/14  8:18 AM      Result Value Ref Range Status   MRSA by PCR NEGATIVE  NEGATIVE Final   Comment:            The GeneXpert MRSA Assay (FDA  approved for NASAL specimens     only), is one component of a     comprehensive MRSA colonization     surveillance program. It is not     intended to diagnose MRSA     infection nor to guide  or     monitor treatment for     MRSA infections.     Studies:  Recent x-ray studies have been reviewed in detail by the Attending Physician  Scheduled Meds:  Scheduled Meds: . aspirin  81 mg Oral Daily  . atorvastatin  80 mg Oral QHS  . B-complex with vitamin C  1 tablet Oral Daily  . calcium acetate  2,001 mg Oral TID WC  . calcium acetate  667 mg Oral QHS  . carvedilol  25 mg Oral BID WC  . cefTAZidime (FORTAZ)  IV  2 g Intravenous Q T,Th,Sa-HD  . cloNIDine  0.2 mg Transdermal Weekly  . clopidogrel  75 mg Oral Daily  . feeding supplement (PRO-STAT SUGAR FREE 64)  30 mL Oral BID  . ferrous sulfate  325 mg Oral TID WC  . heparin  5,000 Units Subcutaneous 3 times per day  . hydrALAZINE  75 mg Oral TID  . insulin aspart  0-5 Units Subcutaneous QHS  . insulin aspart  0-9 Units Subcutaneous TID WC  . insulin glargine  6 Units Subcutaneous QHS  . isosorbide mononitrate  60 mg Oral Daily  . pantoprazole  40 mg Oral BID  . sertraline  100 mg Oral Daily   Continuous Infusions:   Time spent on care of this patient: >35 min   Calvert CantorSaima Aedan Geimer, MD 03/16/2014, 2:14 PM  LOS: 2 days   [facility] Office  [phone] Pager - Text Page per Loretha StaplerAmion   If 7PM-7AM, please contact night-coverage [email or web address]

## 2014-03-16 NOTE — Progress Notes (Signed)
Patient ID: Albert Grant, male   DOB: 1941-02-14, 73 y.o.   MRN: [id number]            PROGRESS NOTE  DATE: 03/13/2014    FACILITY:  [facility]  LEVEL OF CARE: SNF (31)  Acute Visit  CHIEF COMPLAINT:  Manage anemia of chronic kidney disease and nausea.    HISTORY OF PRESENT ILLNESS: I was requested by the staff to assess the patient regarding above problem(s):  ANEMIA: The anemia has been stable. The staff denies fatigue, melena or hematochezia. No complications from the medications currently being used.   On 03/10/2014:  Hemoglobin 10.3, MCV 97.  On 01/12/2014:  Hemoglobin 10.9.  Patient is a poor historian secondary to language barrier.    NAUSEA:  New problem.  Staff report that patient had nausea this morning.  Patient did not vomit.  She had a bowel movement yesterday.    PAST MEDICAL HISTORY : Reviewed.  No changes/see problem list  CURRENT MEDICATIONS: Reviewed per MAR/see medication list  REVIEW OF SYSTEMS:   Unobtainable secondary to language barrier.    PHYSICAL EXAMINATION  VS:  T 98.1       P 92      RR 20      BP 140/70    GENERAL: no acute distress, normal body habitus NECK: supple, trachea midline, no neck masses, no thyroid tenderness, no thyromegaly RESPIRATORY: breathing is even & unlabored, BS CTAB CARDIAC: RRR, no murmur,no extra heart sounds, no edema GI: abdomen soft, normal BS, no masses, no tenderness, no hepatomegaly, no splenomegaly PSYCHIATRIC: the patient is alert & oriented to person, affect & behavior appropriate  ASSESSMENT/PLAN:   Anemia of chronic kidney disease.  Hemoglobin stable.  Continue iron.    Nausea.  New problem.  Patient did not vomit.  Use p.r.n. Phenergan per facility protocol.    CPT CODE: 25427       Angela Cox, MD [facility] [phone]

## 2014-03-16 NOTE — Clinical Social Work Note (Signed)
CSW received notification from Harvey at [facility], SNF that pt is a resident there.  13M CSW provided this information to 6E CSW.  Vickii Penna, LCSWA [phone]  Clinical Social Work

## 2014-03-17 DIAGNOSIS — D631 Anemia in chronic kidney disease: Secondary | ICD-10-CM

## 2014-03-17 DIAGNOSIS — N039 Chronic nephritic syndrome with unspecified morphologic changes: Secondary | ICD-10-CM

## 2014-03-17 LAB — COMPREHENSIVE METABOLIC PANEL
ALK PHOS: 141 U/L — AB (ref 39–117)
ALT: 57 U/L — ABNORMAL HIGH (ref 0–53)
AST: 32 U/L (ref 0–37)
Albumin: 2.3 g/dL — ABNORMAL LOW (ref 3.5–5.2)
BILIRUBIN TOTAL: 0.6 mg/dL (ref 0.3–1.2)
BUN: 48 mg/dL — AB (ref 6–23)
CHLORIDE: 98 meq/L (ref 96–112)
CO2: 26 meq/L (ref 19–32)
Calcium: 6.9 mg/dL — ABNORMAL LOW (ref 8.4–10.5)
Creatinine, Ser: 6.56 mg/dL — ABNORMAL HIGH (ref 0.50–1.35)
GFR calc Af Amer: 9 mL/min — ABNORMAL LOW (ref >90)
GFR calc non Af Amer: 7 mL/min — ABNORMAL LOW (ref >90)
Glucose, Bld: 90 mg/dL (ref 70–99)
Potassium: 4.1 mEq/L (ref 3.7–5.3)
Sodium: 138 mEq/L (ref 137–147)
Total Protein: 5.5 g/dL — ABNORMAL LOW (ref 6.0–8.3)

## 2014-03-17 LAB — CBC WITH DIFFERENTIAL/PLATELET
BASOS ABS: 0 10*3/uL (ref 0.0–0.1)
Basophils Relative: 0 % (ref 0–1)
Eosinophils Absolute: 0.3 10*3/uL (ref 0.0–0.7)
Eosinophils Relative: 7 % — ABNORMAL HIGH (ref 0–5)
HCT: 25.2 % — ABNORMAL LOW (ref 39.0–52.0)
Hemoglobin: 8.1 g/dL — ABNORMAL LOW (ref 13.0–17.0)
LYMPHS PCT: 12 % (ref 12–46)
Lymphs Abs: 0.6 10*3/uL — ABNORMAL LOW (ref 0.7–4.0)
MCH: 31.4 pg (ref 26.0–34.0)
MCHC: 32.1 g/dL (ref 30.0–36.0)
MCV: 97.7 fL (ref 78.0–100.0)
Monocytes Absolute: 0.6 10*3/uL (ref 0.1–1.0)
Monocytes Relative: 12 % (ref 3–12)
NEUTROS PCT: 69 % (ref 43–77)
Neutro Abs: 3.3 10*3/uL (ref 1.7–7.7)
PLATELETS: 192 10*3/uL (ref 150–400)
RBC: 2.58 MIL/uL — AB (ref 4.22–5.81)
RDW: 13.8 % (ref 11.5–15.5)
WBC: 4.7 10*3/uL (ref 4.0–10.5)

## 2014-03-17 LAB — GLUCOSE, CAPILLARY
Glucose-Capillary: 180 mg/dL — ABNORMAL HIGH (ref 70–99)
Glucose-Capillary: 138 mg/dL — ABNORMAL HIGH (ref 70–99)
Glucose-Capillary: 150 mg/dL — ABNORMAL HIGH (ref 70–99)

## 2014-03-17 NOTE — Progress Notes (Signed)
Impression/Plan  1. ESRD - TTS HD at [facility]; non adherence to therapy. Plan next treatment Saturday  2. Life threatening hyperkalemia -resolved after dialysis 3. Encephalopathy -?depressed 4. Anemia -         5. H/o pseudomonas sepsis "catheter related" - treated at [facility] and then d/c'd to SNF   Subjective: Interval History: tolerated HD yesterday  Objective: Vital signs in last 24 hours: Temp:  [97.7 F (36.5 C)-99 F (37.2 C)] 99 F (37.2 C) (05/08 0749) Pulse Rate:  [71-92] 78 (05/08 0923) Resp:  [17-20] 18 (05/08 0749) BP: (127-206)/(54-87) 188/76 mmHg (05/08 0923) SpO2:  [95 %-98 %] 97 % (05/08 0749) Weight:  [61.7 kg (136 lb 0.4 oz)] 61.7 kg (136 lb 0.4 oz) (05/07 1115) Weight change: 0.7 kg (1 lb 8.7 oz)  Intake/Output from previous day: 05/07 0701 - 05/08 0700 In: 50 [IV Piggyback:50] Out: 1400  Intake/Output this shift:    General appearance: alert, no distress and seated in fetal position Extremities: extremities normal, atraumatic, no cyanosis or edema Looks depressed  Lab Results:  Recent Labs  03/16/14 0540 03/17/14 0506  WBC 4.2 4.7  HGB 8.3* 8.1*  HCT 25.9* 25.2*  PLT 186 192   BMET:  Recent Labs  03/16/14 0540 03/17/14 0506  NA 140 138  K 4.5 4.1  CL 99 98  CO2 23 26  GLUCOSE 145* 90  BUN 74* 48*  CREATININE 8.25* 6.56*  CALCIUM 7.8* 6.9*   No results found for this basename: PTH,  in the last 72 hours Iron Studies: No results found for this basename: IRON, TIBC, TRANSFERRIN, FERRITIN,  in the last 72 hours Studies/Results: No results found.  Scheduled: . aspirin  81 mg Oral Daily  . atorvastatin  80 mg Oral QHS  . B-complex with vitamin C  1 tablet Oral Daily  . calcium acetate  2,001 mg Oral TID WC  . calcium acetate  667 mg Oral QHS  . carvedilol  25 mg Oral BID WC  . cefTAZidime (FORTAZ)  IV  2 g Intravenous Q T,Th,Sa-HD  . cloNIDine  0.2 mg Transdermal Weekly  . clopidogrel  75 mg Oral Daily  . feeding supplement  (PRO-STAT SUGAR FREE 64)  30 mL Oral BID  . ferrous sulfate  325 mg Oral TID WC  . heparin  5,000 Units Subcutaneous 3 times per day  . hydrALAZINE  75 mg Oral TID  . insulin aspart  0-5 Units Subcutaneous QHS  . insulin aspart  0-9 Units Subcutaneous TID WC  . insulin glargine  6 Units Subcutaneous QHS  . isosorbide mononitrate  60 mg Oral Daily  . pantoprazole  40 mg Oral BID  . sertraline  100 mg Oral Daily     LOS: 3 days   Lauris Poag 03/17/2014,9:38 AM

## 2014-03-17 NOTE — Progress Notes (Signed)
ANTIBIOTIC CONSULT NOTE - FOLLOW UP  Pharmacy Consult for Ceftazidime Indication: r/o sepsis in setting of recent PSA bacteremia  Allergies  Allergen Reactions  . Lisinopril Other (See Comments)    [facility]    Patient Measurements: Height: 5\' 6"  (167.6 cm) Weight: 136 lb 0.4 oz (61.7 kg) IBW/kg (Calculated) : 63.8  Vital Signs: Temp: 99 F (37.2 C) (05/08 0749) Temp src: Oral (05/08 0749) BP: 196/84 mmHg (05/08 0749) Pulse Rate: 77 (05/08 0749) Intake/Output from previous day: 05/07 0701 - 05/08 0700 In: 50 [IV Piggyback:50] Out: 1400  Intake/Output from this shift:    Labs:  Recent Labs  03/15/14 0617 03/16/14 0540 03/17/14 0506  WBC 5.6 4.2 4.7  HGB 8.7* 8.3* 8.1*  PLT 185 186 192  CREATININE 6.22*  6.31* 8.25* 6.56*   Estimated Creatinine Clearance: 8.6 ml/min (by C-G formula based on Cr of 6.56). No results found for this basename: VANCOTROUGH, Leodis BinetVANCOPEAK, VANCORANDOM, GENTTROUGH, GENTPEAK, GENTRANDOM, TOBRATROUGH, TOBRAPEAK, TOBRARND, AMIKACINPEAK, AMIKACINTROU, AMIKACIN,  in the last 72 hours   Microbiology: Recent Results (from the past 720 hour(s))  CULTURE, BLOOD (ROUTINE X 2)     Status: None   Collection Time    03/14/14  8:48 PM      Result Value Ref Range Status   Specimen Description BLOOD EJ   Final   Special Requests BOTTLES DRAWN AEROBIC AND ANAEROBIC 10CC EACH   Final   Culture  Setup Time     Final   Value: 03/15/2014 01:43     [facility]   Culture     Final   Value:        BLOOD CULTURE RECEIVED NO GROWTH TO DATE CULTURE WILL BE HELD FOR 5 DAYS BEFORE ISSUING A FINAL NEGATIVE REPORT     Performed at [facility]   Report Status PENDING   Incomplete  CULTURE, BLOOD (ROUTINE X 2)     Status: None   Collection Time    03/14/14  8:53 PM      Result Value Ref Range Status   Specimen Description BLOOD ARM RIGHT   Final   Special Requests BOTTLES DRAWN AEROBIC AND ANAEROBIC 10CC   Final   Culture  Setup Time      Final   Value: 03/15/2014 01:43     [facility]   Culture     Final   Value: STAPHYLOCOCCUS SPECIES     Note: Gram Stain Report Called to,Read Back By and Verified With: EMMANUEL CASTRO ON 03/15/2014 AT 8:20P BY WILEJ     Performed at [facility]   Report Status PENDING   Incomplete  URINE CULTURE     Status: None   Collection Time    03/14/14  9:11 PM      Result Value Ref Range Status   Specimen Description URINE, CATHETERIZED   Final   Special Requests NONE   Final   Culture  Setup Time     Final   Value: 03/15/2014 04:38     Performed at [facility]   Colony Count     Final   Value: NO GROWTH     Performed at [facility]   Culture     Final   Value: NO GROWTH     Performed at [facility]   Report Status 03/16/2014 FINAL   Final  MRSA PCR SCREENING     Status: None   Collection Time  03/15/14  8:18 AM      Result Value Ref Range Status   MRSA by PCR NEGATIVE  NEGATIVE Final   Comment:            The GeneXpert MRSA Assay (FDA     approved for NASAL specimens     only), is one component of a     comprehensive MRSA colonization     surveillance program. It is not     intended to diagnose MRSA     infection nor to guide or     monitor treatment for     MRSA infections.    Anti-infectives   Start     Dose/Rate Route Frequency Ordered Stop   03/16/14 1200  cefTAZidime (FORTAZ) 2 g in dextrose 5 % 50 mL IVPB     2 g 100 mL/hr over 30 Minutes Intravenous Every T-Th-Sa (Hemodialysis) 03/15/14 1233     03/15/14 1400  piperacillin-tazobactam (ZOSYN) IVPB 2.25 g  Status:  Discontinued     2.25 g 100 mL/hr over 30 Minutes Intravenous 3 times per day 03/15/14 0438 03/15/14 1216   03/15/14 0445  vancomycin (VANCOCIN) 500 mg in sodium chloride 0.9 % 100 mL IVPB     500 mg 100 mL/hr over 60 Minutes Intravenous  Once 03/15/14 0438 03/15/14 0648   03/14/14 2300  vancomycin (VANCOCIN) IVPB 1000 mg/200 mL premix     1,000  mg 200 mL/hr over 60 Minutes Intravenous  Once 03/14/14 2259 03/15/14 0433   03/14/14 2300  piperacillin-tazobactam (ZOSYN) IVPB 3.375 g     3.375 g 100 mL/hr over 30 Minutes Intravenous  Once 03/14/14 2259 03/15/14 0618      Assessment: 72 YOM with ESRD who continues on Ceftazidime for r/o sepsis in the setting of a recent PSA bacteremia. Afebrile, WBC WNL, received MD post-HD on 5/7. Doses remain appropriate - on regular HD schedule.  Vanc 5/6 >> 5/6 Zosyn 5/6 >> 5/6  Elita Quick 5/6 >>  5/5 BCx >> 1/2 Staph species (likely contaminant) 5/5 UCx >> NG  Goal of Therapy:  Proper antibiotics for infection/cultures adjusted for renal/hepatic function   Plan:  1. Continue Ceftazidime 2g post HD on T/Th/Sat 2. Will continue to follow HD schedule/duration, culture results, LOT, and antibiotic de-escalation plans   Georgina Pillion, PharmD, BCPS Clinical Pharmacist Pager: [phone] 03/17/2014 10:45 AM

## 2014-03-17 NOTE — Progress Notes (Signed)
Patient refused most of meds this evening.

## 2014-03-17 NOTE — Progress Notes (Signed)
[facility] Progress Note   Albert Grant JAS:[id number] DOB: 12/31/1940 DOA: 03/14/2014 PCP: No PCP Per Patient  Brief narrative: Albert Grant is a 73 y.o. male presenting on 03/14/2014 with ESRD non-compliant with dialysis, recent pseudomonas bacteremia, DM2 presenting with encephalopathy after refusing dialysis x 2.    Subjective: Alert, no complaints- not communicating with me today. I have tried discussing the importance of taking his BP meds and he is not communicating with me.   Assessment/Plan: Principal Problem:   Acute encephalopathy - resolving with dialysis  Active Problems:   End stage renal disease - cont dialysis for now- - have asked palliative care to address GOC and whether he wants to continue on dialysis    Hyperkalemia - corrected with daily     Pseudomonas sepsis - cont Fortaz with dialysis no stop date?    DM type 2 causing renal disease - will place on sliding scale   Code Status: Full code Family Communication: none Disposition Plan: return to NH once palliative meeting complete  Consultants: nephrology  Procedures: none  Antibiotics: Antibiotics Given (last 72 hours)   Date/Time Action Medication Dose Rate   03/16/14 1026 Given  [given for HD]   cefTAZidime (FORTAZ) 2 g in dextrose 5 % 50 mL IVPB 2 g 100 mL/hr       DVT prophylaxis: Heparin  Objective: Filed Weights   03/15/14 2117 03/16/14 0714 03/16/14 1115  Weight: 63.89 kg (140 lb 13.6 oz) 63.1 kg (139 lb 1.8 oz) 61.7 kg (136 lb 0.4 oz)   Blood pressure 188/76, pulse 78, temperature 99 F (37.2 C), temperature source Oral, resp. rate 18, height 5\' 6"  (1.676 m), weight 61.7 kg (136 lb 0.4 oz), SpO2 97.00%.  Intake/Output Summary (Last 24 hours) at 03/17/14 0955 Last data filed at 03/17/14 0600  Gross per 24 hour  Intake     50 ml  Output   1400 ml  Net  -1350 ml     Exam: General: No acute respiratory distress Lungs: Clear to auscultation bilaterally without wheezes  or crackles Cardiovascular: Regular rate and rhythm without murmur gallop or rub normal S1 and S2 Abdomen: Nontender, nondistended, soft, bowel sounds positive, no rebound, no ascites, no appreciable mass Extremities: No significant cyanosis, clubbing, or edema bilateral lower extremities  Data Reviewed: Basic Metabolic Panel:  Recent Labs Lab 03/14/14 2038 03/14/14 2103 03/14/14 2252 03/14/14 2344 03/15/14 0617 03/16/14 0540 03/17/14 0506  NA 130* 129*  --  137 142  140 140 138  K >7.7* 7.5* 6.9* 7.0* 3.9  3.9 4.5 4.1  CL 92* 100  --  97 101  98 99 98  CO2 17*  --   --  18* 23  21 23 26   GLUCOSE 243* 238*  --  118* 100*  103* 145* 90  BUN 138* >140*  --  136* 61*  60* 74* 48*  CREATININE 12.25* 12.80* 12.10* 12.30* 6.22*  6.31* 8.25* 6.56*  CALCIUM 9.2  --   --  8.8 8.5  8.5 7.8* 6.9*  PHOS  --   --   --  11.4* 5.2*  --   --    Liver Function Tests:  Recent Labs Lab 03/14/14 2038 03/14/14 2344 03/15/14 0617 03/16/14 0540 03/17/14 0506  AST 216*  --  127* 60* 32  ALT 141*  --  122* 89* 57*  ALKPHOS 247*  --  223* 183* 141*  BILITOT 0.5  --  0.6 0.5 0.6  PROT 6.2  --  6.0 5.9* 5.5*  ALBUMIN 2.7* 2.8* 2.7*  2.7* 2.4* 2.3*   No results found for this basename: LIPASE, AMYLASE,  in the last 168 hours  Recent Labs Lab 03/15/14 0100  AMMONIA 18   CBC:  Recent Labs Lab 03/14/14 2038 03/14/14 2103 03/14/14 2252 03/15/14 0617 03/16/14 0540 03/17/14 0506  WBC 4.5  --  4.3 5.6 4.2 4.7  NEUTROABS 4.2  --   --  4.7 3.1 3.3  HGB 8.9* 8.8* 8.9* 8.7* 8.3* 8.1*  HCT 27.1* 26.0* 26.8* 26.1* 25.9* 25.2*  MCV 97.1  --  96.1 96.3 98.5 97.7  PLT 176  --  179 185 186 192   Cardiac Enzymes: No results found for this basename: CKTOTAL, CKMB, CKMBINDEX, TROPONINI,  in the last 168 hours BNP (last 3 results) No results found for this basename: PROBNP,  in the last 8760 hours CBG:  Recent Labs Lab 03/15/14 2116 03/16/14 1209 03/16/14 1627 03/16/14 2029  03/17/14 0749  GLUCAP 207* 126* 185* 158* 180*    Recent Results (from the past 240 hour(s))  CULTURE, BLOOD (ROUTINE X 2)     Status: None   Collection Time    03/14/14  8:48 PM      Result Value Ref Range Status   Specimen Description BLOOD EJ   Final   Special Requests BOTTLES DRAWN AEROBIC AND ANAEROBIC 10CC EACH   Final   Culture  Setup Time     Final   Value: 03/15/2014 01:43     Performed at [facility]   Culture     Final   Value:        BLOOD CULTURE RECEIVED NO GROWTH TO DATE CULTURE WILL BE HELD FOR 5 DAYS BEFORE ISSUING A FINAL NEGATIVE REPORT     Performed at [facility]   Report Status PENDING   Incomplete  CULTURE, BLOOD (ROUTINE X 2)     Status: None   Collection Time    03/14/14  8:53 PM      Result Value Ref Range Status   Specimen Description BLOOD ARM RIGHT   Final   Special Requests BOTTLES DRAWN AEROBIC AND ANAEROBIC 10CC   Final   Culture  Setup Time     Final   Value: 03/15/2014 01:43     Performed at [facility]   Culture     Final   Value: STAPHYLOCOCCUS SPECIES     Note: Gram Stain Report Called to,Read Back By and Verified With: EMMANUEL CASTRO ON 03/15/2014 AT 8:20P BY WILEJ     Performed at [facility]   Report Status PENDING   Incomplete  URINE CULTURE     Status: None   Collection Time    03/14/14  9:11 PM      Result Value Ref Range Status   Specimen Description URINE, CATHETERIZED   Final   Special Requests NONE   Final   Culture  Setup Time     Final   Value: 03/15/2014 04:38     Performed at [facility]   Colony Count     Final   Value: NO GROWTH     Performed at [facility]   Culture     Final   Value: NO GROWTH     Performed at [facility]   Report Status 03/16/2014 FINAL   Final  MRSA PCR SCREENING     Status: None   Collection Time    03/15/14  8:18  AM      Result Value Ref Range Status   MRSA by PCR NEGATIVE  NEGATIVE Final   Comment:            The GeneXpert  MRSA Assay (FDA     approved for NASAL specimens     only), is one component of a     comprehensive MRSA colonization     surveillance program. It is not     intended to diagnose MRSA     infection nor to guide or     monitor treatment for     MRSA infections.     Studies:  Recent x-ray studies have been reviewed in detail by the Attending Physician  Scheduled Meds:  Scheduled Meds: . aspirin  81 mg Oral Daily  . atorvastatin  80 mg Oral QHS  . B-complex with vitamin C  1 tablet Oral Daily  . calcium acetate  2,001 mg Oral TID WC  . calcium acetate  667 mg Oral QHS  . carvedilol  25 mg Oral BID WC  . cefTAZidime (FORTAZ)  IV  2 g Intravenous Q T,Th,Sa-HD  . cloNIDine  0.2 mg Transdermal Weekly  . clopidogrel  75 mg Oral Daily  . feeding supplement (PRO-STAT SUGAR FREE 64)  30 mL Oral BID  . ferrous sulfate  325 mg Oral TID WC  . heparin  5,000 Units Subcutaneous 3 times per day  . hydrALAZINE  75 mg Oral TID  . insulin aspart  0-5 Units Subcutaneous QHS  . insulin aspart  0-9 Units Subcutaneous TID WC  . insulin glargine  6 Units Subcutaneous QHS  . isosorbide mononitrate  60 mg Oral Daily  . pantoprazole  40 mg Oral BID  . sertraline  100 mg Oral Daily   Continuous Infusions:   Time spent on care of this patient: 25 min   Calvert CantorSaima Celisse Ciulla, MD 03/17/2014, 9:55 AM  LOS: 3 days   [facility] Office  [phone] Pager - Text Page per Loretha StaplerAmion   If 7PM-7AM, please contact night-coverage [email or web address]

## 2014-03-18 DIAGNOSIS — I2589 Other forms of chronic ischemic heart disease: Secondary | ICD-10-CM

## 2014-03-18 LAB — CBC WITH DIFFERENTIAL/PLATELET
Basophils Absolute: 0 10*3/uL (ref 0.0–0.1)
Basophils Relative: 0 % (ref 0–1)
EOS ABS: 0.1 10*3/uL (ref 0.0–0.7)
Eosinophils Relative: 2 % (ref 0–5)
HEMATOCRIT: 24.7 % — AB (ref 39.0–52.0)
HEMOGLOBIN: 8.2 g/dL — AB (ref 13.0–17.0)
LYMPHS ABS: 0.4 10*3/uL — AB (ref 0.7–4.0)
Lymphocytes Relative: 8 % — ABNORMAL LOW (ref 12–46)
MCH: 32.7 pg (ref 26.0–34.0)
MCHC: 33.2 g/dL (ref 30.0–36.0)
MCV: 98.4 fL (ref 78.0–100.0)
MONO ABS: 0.6 10*3/uL (ref 0.1–1.0)
MONOS PCT: 11 % (ref 3–12)
NEUTROS PCT: 79 % — AB (ref 43–77)
Neutro Abs: 4.1 10*3/uL (ref 1.7–7.7)
Platelets: 176 10*3/uL (ref 150–400)
RBC: 2.51 MIL/uL — AB (ref 4.22–5.81)
RDW: 14 % (ref 11.5–15.5)
WBC: 5.2 10*3/uL (ref 4.0–10.5)

## 2014-03-18 LAB — PROCALCITONIN: Procalcitonin: 1.22 ng/mL

## 2014-03-18 LAB — CULTURE, BLOOD (ROUTINE X 2)

## 2014-03-18 LAB — GLUCOSE, CAPILLARY
GLUCOSE-CAPILLARY: 221 mg/dL — AB (ref 70–99)
GLUCOSE-CAPILLARY: 181 mg/dL — AB (ref 70–99)
Glucose-Capillary: 110 mg/dL — ABNORMAL HIGH (ref 70–99)
Glucose-Capillary: 139 mg/dL — ABNORMAL HIGH (ref 70–99)
Glucose-Capillary: 104 mg/dL — ABNORMAL HIGH (ref 70–99)

## 2014-03-18 LAB — COMPREHENSIVE METABOLIC PANEL
ALBUMIN: 2.4 g/dL — AB (ref 3.5–5.2)
ALK PHOS: 134 U/L — AB (ref 39–117)
ALT: 40 U/L (ref 0–53)
AST: 21 U/L (ref 0–37)
BUN: 61 mg/dL — AB (ref 6–23)
CALCIUM: 7 mg/dL — AB (ref 8.4–10.5)
CO2: 22 mEq/L (ref 19–32)
Chloride: 96 mEq/L (ref 96–112)
Creatinine, Ser: 8.2 mg/dL — ABNORMAL HIGH (ref 0.50–1.35)
GFR calc Af Amer: 7 mL/min — ABNORMAL LOW (ref >90)
GFR calc non Af Amer: 6 mL/min — ABNORMAL LOW (ref >90)
Glucose, Bld: 234 mg/dL — ABNORMAL HIGH (ref 70–99)
POTASSIUM: 4.3 meq/L (ref 3.7–5.3)
SODIUM: 135 meq/L — AB (ref 137–147)
Total Bilirubin: 0.4 mg/dL (ref 0.3–1.2)
Total Protein: 5.6 g/dL — ABNORMAL LOW (ref 6.0–8.3)

## 2014-03-18 MED ORDER — DARBEPOETIN ALFA-POLYSORBATE 60 MCG/0.3ML IJ SOLN
60.0000 ug | INTRAMUSCULAR | Status: DC
  Administered 2014-03-18: 60 ug via INTRAVENOUS
  Filled 2014-03-18: qty 0.3

## 2014-03-18 MED ORDER — DARBEPOETIN ALFA-POLYSORBATE 60 MCG/0.3ML IJ SOLN
INTRAMUSCULAR | Status: AC
  Administered 2014-03-18: 60 ug via INTRAVENOUS
  Filled 2014-03-18: qty 0.3

## 2014-03-18 NOTE — Progress Notes (Addendum)
[facility] Progress Note   Albert Grant MEQ:[id number] DOB: 11/17/1940 DOA: 03/14/2014 PCP: No PCP Per Patient  Brief narrative: Albert Grant is a 73 y.o. male presenting on 03/14/2014 with ESRD non-compliant with dialysis, recent pseudomonas bacteremia, DM2 presenting with encephalopathy after refusing dialysis x 2.    Subjective: Alert, no complaints- tried to talk to him about whether he is depressed or whether he would like treatment for it.  He states he is taking too many pills. RN tells me that he is often refusing his meds. He tells me that he is taking them.    Assessment/Plan: Principal Problem:   Acute encephalopathy - resolving with dialysis  Active Problems:   End stage renal disease - cont dialysis for now- - have asked palliative care to address GOC and whether he wants to continue on dialysis - will consult psych for competency eval and for treatment of depression    Hyperkalemia - corrected with daily     Pseudomonas sepsis - cont Fortaz with dialysis-- no stop date?    DM type 2 causing renal disease - will place on sliding scale   Code Status: Full code Family Communication: with sister in law Disposition Plan: return to NH once palliative meeting complete  Consultants: nephrology  Procedures: none  Antibiotics: Antibiotics Given (last 72 hours)   Date/Time Action Medication Dose Rate   03/16/14 1026 Given  [given for HD]   cefTAZidime (FORTAZ) 2 g in dextrose 5 % 50 mL IVPB 2 g 100 mL/hr       DVT prophylaxis: Heparin  Objective: Filed Weights   03/15/14 2117 03/16/14 0714 03/16/14 1115  Weight: 63.89 kg (140 lb 13.6 oz) 63.1 kg (139 lb 1.8 oz) 61.7 kg (136 lb 0.4 oz)   Blood pressure 171/74, pulse 81, temperature 99.6 F (37.6 C), temperature source Oral, resp. rate 16, height 5\' 6"  (1.676 m), weight 61.7 kg (136 lb 0.4 oz), SpO2 100.00%.  Intake/Output Summary (Last 24 hours) at 03/18/14 1314 Last data filed at 03/18/14 0900  Gross per 24 hour  Intake    240 ml  Output      0 ml  Net    240 ml     Exam: General: No acute respiratory distress Lungs: Clear to auscultation bilaterally without wheezes or crackles Cardiovascular: Regular rate and rhythm without murmur gallop or rub normal S1 and S2 Abdomen: Nontender, nondistended, soft, bowel sounds positive, no rebound, no ascites, no appreciable mass Extremities: No significant cyanosis, clubbing, or edema bilateral lower extremities  Data Reviewed: Basic Metabolic Panel:  Recent Labs Lab 03/14/14 2103  03/14/14 2344 03/15/14 0617 03/16/14 0540 03/17/14 0506 03/18/14 0500  NA 129*  --  137 142  140 140 138 135*  K 7.5*  < > 7.0* 3.9  3.9 4.5 4.1 4.3  CL 100  --  97 101  98 99 98 96  CO2  --   --  18* 23  21 23 26 22   GLUCOSE 238*  --  118* 100*  103* 145* 90 234*  BUN >140*  --  136* 61*  60* 74* 48* 61*  CREATININE 12.80*  < > 12.30* 6.22*  6.31* 8.25* 6.56* 8.20*  CALCIUM  --   --  8.8 8.5  8.5 7.8* 6.9* 7.0*  PHOS  --   --  11.4* 5.2*  --   --   --   < > = values in this interval not displayed. Liver Function Tests:  Recent Labs Lab  03/14/14 2038 03/14/14 2344 03/15/14 0617 03/16/14 0540 03/17/14 0506 03/18/14 0500  AST 216*  --  127* 60* 32 21  ALT 141*  --  122* 89* 57* 40  ALKPHOS 247*  --  223* 183* 141* 134*  BILITOT 0.5  --  0.6 0.5 0.6 0.4  PROT 6.2  --  6.0 5.9* 5.5* 5.6*  ALBUMIN 2.7* 2.8* 2.7*  2.7* 2.4* 2.3* 2.4*   No results found for this basename: LIPASE, AMYLASE,  in the last 168 hours  Recent Labs Lab 03/15/14 0100  AMMONIA 18   CBC:  Recent Labs Lab 03/14/14 2038  03/14/14 2252 03/15/14 0617 03/16/14 0540 03/17/14 0506 03/18/14 0500  WBC 4.5  --  4.3 5.6 4.2 4.7 5.2  NEUTROABS 4.2  --   --  4.7 3.1 3.3 4.1  HGB 8.9*  < > 8.9* 8.7* 8.3* 8.1* 8.2*  HCT 27.1*  < > 26.8* 26.1* 25.9* 25.2* 24.7*  MCV 97.1  --  96.1 96.3 98.5 97.7 98.4  PLT 176  --  179 185 186 192 176  < > = values in this  interval not displayed. Cardiac Enzymes: No results found for this basename: CKTOTAL, CKMB, CKMBINDEX, TROPONINI,  in the last 168 hours BNP (last 3 results) No results found for this basename: PROBNP,  in the last 8760 hours CBG:  Recent Labs Lab 03/17/14 1854 03/17/14 2141 03/18/14 0810 03/18/14 1110 03/18/14 1241  GLUCAP 138* 150* 110* 221* 181*    Recent Results (from the past 240 hour(s))  CULTURE, BLOOD (ROUTINE X 2)     Status: None   Collection Time    03/14/14  8:48 PM      Result Value Ref Range Status   Specimen Description BLOOD EJ   Final   Special Requests BOTTLES DRAWN AEROBIC AND ANAEROBIC 10CC EACH   Final   Culture  Setup Time     Final   Value: 03/15/2014 01:43     Performed at [facility]   Culture     Final   Value:        BLOOD CULTURE RECEIVED NO GROWTH TO DATE CULTURE WILL BE HELD FOR 5 DAYS BEFORE ISSUING A FINAL NEGATIVE REPORT     Performed at [facility]   Report Status PENDING   Incomplete  CULTURE, BLOOD (ROUTINE X 2)     Status: None   Collection Time    03/14/14  8:53 PM      Result Value Ref Range Status   Specimen Description BLOOD ARM RIGHT   Final   Special Requests BOTTLES DRAWN AEROBIC AND ANAEROBIC 10CC   Final   Culture  Setup Time     Final   Value: 03/15/2014 01:43     Performed at [facility]   Culture     Final   Value: STAPHYLOCOCCUS SPECIES (COAGULASE NEGATIVE)     Note: THE SIGNIFICANCE OF ISOLATING THIS ORGANISM FROM A SINGLE SET OF BLOOD CULTURES WHEN MULTIPLE SETS ARE DRAWN IS UNCERTAIN. PLEASE NOTIFY THE MICROBIOLOGY DEPARTMENT WITHIN ONE WEEK IF SPECIATION AND SENSITIVITIES ARE REQUIRED.     Note: Gram Stain Report Called to,Read Back By and Verified With: EMMANUEL CASTRO ON 03/15/2014 AT 8:20P BY Serafina Mitchell     Performed at [facility]   Report Status 03/18/2014 FINAL   Final  URINE CULTURE     Status: None   Collection Time    03/14/14  9:11 PM  Result Value Ref Range Status    Specimen Description URINE, CATHETERIZED   Final   Special Requests NONE   Final   Culture  Setup Time     Final   Value: 03/15/2014 04:38     [facility]formed at [facility]   Colony Count     Final   Value: NO GROWTH     [facility]formed at [facility]   Culture     Final   Value: NO GROWTH     [facility]formed at [facility]   Report Status 03/16/2014 FINAL   Final  MRSA PCR SCREENING     Status: None   Collection Time    03/15/14  8:18 AM      Result Value Ref Range Status   MRSA by PCR NEGATIVE  NEGATIVE Final   Comment:            The GeneXpert MRSA Assay (FDA     approved for NASAL specimens     only), is one component of a     comprehensive MRSA colonization     surveillance program. It is not     intended to diagnose MRSA     infection nor to guide or     monitor treatment for     MRSA infections.     Studies:  Recent x-ray studies have been reviewed in detail by the Attending Physician  Scheduled Meds:  Scheduled Meds: . aspirin  81 mg Oral Daily  . atorvastatin  80 mg Oral QHS  . B-complex with vitamin C  1 tablet Oral Daily  . calcium acetate  2,001 mg Oral TID WC  . calcium acetate  667 mg Oral QHS  . carvedilol  25 mg Oral BID WC  . cefTAZidime (FORTAZ)  IV  2 g Intravenous Q T,Th,Sa-HD  . cloNIDine  0.2 mg Transdermal Weekly  . clopidogrel  75 mg Oral Daily  . darbepoetin (ARANESP) injection - DIALYSIS  60 mcg Intravenous Q Sat-HD  . feeding supplement (PRO-STAT SUGAR FREE 64)  30 mL Oral BID  . ferrous sulfate  325 mg Oral TID WC  . heparin  5,000 Units Subcutaneous 3 times per day  . hydrALAZINE  75 mg Oral TID  . insulin aspart  0-5 Units Subcutaneous QHS  . insulin aspart  0-9 Units Subcutaneous TID WC  . insulin glargine  6 Units Subcutaneous QHS  . isosorbide mononitrate  60 mg Oral Daily  . pantoprazole  40 mg Oral BID  . sertraline  100 mg Oral Daily   Continuous Infusions:   Time spent on care of this patient: 25 min   Calvert CantorSaima  Keonta Monceaux, MD 03/18/2014, 1:14 PM  LOS: 4 days   [facility] Office  [phone] Pager - Text Page per Loretha StaplerAmion   If 7PM-7AM, please contact night-coverage [email or web address]

## 2014-03-18 NOTE — Progress Notes (Addendum)
Hemodialysis- Brought pt into unit and attempted to start HD. Arterial needle cannulated without issue. Old bruising and swelling from previous treatment on venous side. Attempted to stick below and above without success. Pt allowed [place] to cannulate x3 then refused anymore sticks. Dr. Lowell Guitar notified. Ice placed on arm, pt sent back to room. Will go back this afternoon if patient will allow per Dr. Lowell Guitar.

## 2014-03-18 NOTE — Progress Notes (Signed)
73 y.o. year-old, dialysis patient of Dr's Lateef and Thedore Mins in [place] (according to [facility] staff he goes to the [facility] on [address]  Impression/Plan  1. ESRD - TTS HD at [facility]; non adherence to therapy. Plan another treatment today 2. Life threatening hyperkalemia -resolved after dialysis 3. Encephalopathy - ? uremic encephalopathy, ? Depression component, language barrier 4. Anemia Give Aranesp         5. H/o pseudomonas sepsis "catheter related" - treated at [facility] and then d/c'd to SNF   Subjective: Interval History: More relaxed and approriate  Objective: Vital signs in last 24 hours: Temp:  [98.5 F (36.9 C)-99.5 F (37.5 C)] 99.5 F (37.5 C) (05/09 0456) Pulse Rate:  [69-86] 84 (05/09 0456) Resp:  [17] 17 (05/09 0456) BP: (177-185)/(70-72) 179/71 mmHg (05/09 0456) SpO2:  [95 %] 95 % (05/09 0456) Weight change:   Intake/Output from previous day: 05/08 0701 - 05/09 0700 In: 120 [P.O.:120] Out: 0  Intake/Output this shift: Total I/O In: 120 [P.O.:120] Out: -  Fetal position, flat affect Resp: clear to auscultation bilaterally Chest wall: no tenderness GI: soft, non-tender; bowel sounds normal; no masses,  no organomegaly Extremities: extremities normal, atraumatic, no cyanosis or edema, LUE AV access with ecchymosis  Lab Results:  Recent Labs  03/16/14 0540 03/17/14 0506  WBC 4.2 4.7  HGB 8.3* 8.1*  HCT 25.9* 25.2*  PLT 186 192   BMET:  Recent Labs  03/16/14 0540 03/17/14 0506  NA 140 138  K 4.5 4.1  CL 99 98  CO2 23 26  GLUCOSE 145* 90  BUN 74* 48*  CREATININE 8.25* 6.56*  CALCIUM 7.8* 6.9*   No results found for this basename: PTH,  in the last 72 hours Iron Studies: No results found for this basename: IRON, TIBC, TRANSFERRIN, FERRITIN,  in the last 72 hours Studies/Results: No results found.  Scheduled: . aspirin  81 mg Oral Daily  . atorvastatin  80 mg Oral QHS  . B-complex with vitamin C  1 tablet Oral Daily   . calcium acetate  2,001 mg Oral TID WC  . calcium acetate  667 mg Oral QHS  . carvedilol  25 mg Oral BID WC  . cefTAZidime (FORTAZ)  IV  2 g Intravenous Q T,Th,Sa-HD  . cloNIDine  0.2 mg Transdermal Weekly  . clopidogrel  75 mg Oral Daily  . feeding supplement (PRO-STAT SUGAR FREE 64)  30 mL Oral BID  . ferrous sulfate  325 mg Oral TID WC  . heparin  5,000 Units Subcutaneous 3 times per day  . hydrALAZINE  75 mg Oral TID  . insulin aspart  0-5 Units Subcutaneous QHS  . insulin aspart  0-9 Units Subcutaneous TID WC  . insulin glargine  6 Units Subcutaneous QHS  . isosorbide mononitrate  60 mg Oral Daily  . pantoprazole  40 mg Oral BID  . sertraline  100 mg Oral Daily     LOS: 4 days   Lauris Poag 03/18/2014,10:03 AM

## 2014-03-18 NOTE — Progress Notes (Addendum)
Hemodialysis- Pt brought back to unit. Cannulated successfully. Venous part of avf remains bruised and tender to touch. Pt refuses to run scheduled time of 3.5 hours. Says he will only run "2". Explained to pt the risk of non adherance. He wants to sign AMA form. Will notify MD.   2000- agrees to stay for 3 hours. Continue to monitor

## 2014-03-18 NOTE — Progress Notes (Signed)
Palliative Care consult received. PMT meeting delayed due to provider and family availability. I have spoken with patient's son Orvilla Fus- he is trying to coordinate with family from out of town to arrange a time for [place] to meet-will also try to coordinate a Nurse, learning disability. Was hopeful that HD would improve patient cognition but he continues to be confused intermittently and have issues with delirium. Awaiting call back for specific time.  Anderson Malta, DO Palliative Medicine

## 2014-03-19 LAB — GLUCOSE, CAPILLARY
GLUCOSE-CAPILLARY: 106 mg/dL — AB (ref 70–99)
Glucose-Capillary: 168 mg/dL — ABNORMAL HIGH (ref 70–99)
Glucose-Capillary: 134 mg/dL — ABNORMAL HIGH (ref 70–99)
Glucose-Capillary: 164 mg/dL — ABNORMAL HIGH (ref 70–99)

## 2014-03-19 MED ORDER — MIRTAZAPINE 7.5 MG PO TABS
7.5000 mg | ORAL_TABLET | Freq: Every day | ORAL | Status: DC
  Filled 2014-03-19 (×2): qty 1

## 2014-03-19 NOTE — Progress Notes (Signed)
73 y.o. year-old, [place] dialysis patient of Dr's Lateef and Thedore Mins in [place] (according to [employer] he goes to the [facility] on [address]).  Pt admitted after missing 2 or more HD treatments with marked azotemia. On admission he was saying he "wants to die".    Impression/Plan  1. ESRD - has had HD x 3, including today, since admit 03/14/12.  Any uremia component should be resolved by now. Next HD on Tuesday (TTS in [place]).  2. Hyperkalemia -resolved 3. Encephalopathy - prob resolved, but language is a barrier 4. Anemia Give Aranesp         5. EOL- patient may want to stop dialysis.  Palliative care is planning to meet with him and family .   Subjective: Interval History: More relaxed and approriate  Objective: Vital signs in last 24 hours: Temp:  [98.6 F (37 C)-99 F (37.2 C)] 99 F (37.2 C) (05/10 0950) Pulse Rate:  [71-110] 71 (05/10 1612) Resp:  [16-20] 16 (05/10 1612) BP: (136-205)/(55-121) 136/59 mmHg (05/10 1612) SpO2:  [93 %-99 %] 99 % (05/10 1612) Weight:  [62.4 kg (137 lb 9.1 oz)] 62.4 kg (137 lb 9.1 oz) (05/09 2122) Weight change:   Intake/Output from previous day: 05/09 0701 - 05/10 0700 In: 340 [P.O.:340] Out: 1497  Intake/Output this shift:   Fetal position, flat affect Resp: clear to auscultation bilaterally Chest wall: no tenderness GI: soft, non-tender; bowel sounds normal; no masses,  no organomegaly Extremities: extremities normal, atraumatic, no cyanosis or edema, LUE AV access with ecchymosis  Lab Results:  Recent Labs  03/17/14 0506 03/18/14 0500  WBC 4.7 5.2  HGB 8.1* 8.2*  HCT 25.2* 24.7*  PLT 192 176   BMET:   Recent Labs  03/17/14 0506 03/18/14 0500  NA 138 135*  K 4.1 4.3  CL 98 96  CO2 26 22  GLUCOSE 90 234*  BUN 48* 61*  CREATININE 6.56* 8.20*  CALCIUM 6.9* 7.0*   No results found for this basename: PTH,  in the last 72 hours Iron Studies: No results found for this basename: IRON, TIBC,  TRANSFERRIN, FERRITIN,  in the last 72 hours Studies/Results: No results found.  Scheduled: . aspirin  81 mg Oral Daily  . atorvastatin  80 mg Oral QHS  . B-complex with vitamin C  1 tablet Oral Daily  . calcium acetate  2,001 mg Oral TID WC  . calcium acetate  667 mg Oral QHS  . carvedilol  25 mg Oral BID WC  . cefTAZidime (FORTAZ)  IV  2 g Intravenous Q T,Th,Sa-HD  . cloNIDine  0.2 mg Transdermal Weekly  . clopidogrel  75 mg Oral Daily  . darbepoetin (ARANESP) injection - DIALYSIS  60 mcg Intravenous Q Sat-HD  . feeding supplement (PRO-STAT SUGAR FREE 64)  30 mL Oral BID  . ferrous sulfate  325 mg Oral TID WC  . heparin  5,000 Units Subcutaneous 3 times per day  . hydrALAZINE  75 mg Oral TID  . insulin aspart  0-5 Units Subcutaneous QHS  . insulin aspart  0-9 Units Subcutaneous TID WC  . insulin glargine  6 Units Subcutaneous QHS  . isosorbide mononitrate  60 mg Oral Daily  . pantoprazole  40 mg Oral BID  . sertraline  100 mg Oral Daily     LOS: 5 days   Barbette Hair Rilee Wendling 03/19/2014,5:49 PM

## 2014-03-19 NOTE — Progress Notes (Addendum)
[facility] Progress Note   Albert Grant OZY:[id number] DOB: 05-Sep-1941 DOA: 03/14/2014 PCP: No PCP Per Patient  Brief narrative: Albert Grant is a 73 y.o. male presenting on 03/14/2014 with ESRD non-compliant with dialysis, recent pseudomonas bacteremia, DM2 presenting with encephalopathy after refusing dialysis x 2.    Subjective: Alert, no complaints   Assessment/Plan: Principal Problem:   Acute encephalopathy - resolving with dialysis  Active Problems:   End stage renal disease - cont dialysis for now- - have asked palliative care to address GOC and whether he wants to continue on dialysis - have also consulted psych for competency eval and for treatment of depression    Hyperkalemia - corrected with dailysis    Pseudomonas sepsis - cont Fortaz with dialysis-- no stop date?- according to calculation by our pharmacy, it appears he may have received 14 days already- will cont Fortaz until it is time for him to discharge back to the NH.     DM type 2 causing renal disease - will place on sliding scale   Code Status: Full code Family Communication: with sister in law, and with daughter today Disposition Plan: return to NH once palliative meeting complete  Consultants: nephrology  Procedures: none  Antibiotics: Antibiotics Given (last 72 hours)   None       DVT prophylaxis: Heparin  Objective: Filed Weights   03/16/14 0714 03/16/14 1115 03/18/14 2122  Weight: 63.1 kg (139 lb 1.8 oz) 61.7 kg (136 lb 0.4 oz) 62.4 kg (137 lb 9.1 oz)   Blood pressure 156/73, pulse 76, temperature 99 F (37.2 C), temperature source Oral, resp. rate 16, height 5\' 6"  (1.676 m), weight 62.4 kg (137 lb 9.1 oz), SpO2 97.00%.  Intake/Output Summary (Last 24 hours) at 03/19/14 1542 Last data filed at 03/19/14 0208  Gross per 24 hour  Intake    220 ml  Output   1497 ml  Net  -1277 ml     Exam: General: No acute respiratory distress Lungs: Clear to auscultation bilaterally  without wheezes or crackles Cardiovascular: Regular rate and rhythm without murmur gallop or rub normal S1 and S2 Abdomen: Nontender, nondistended, soft, bowel sounds positive, no rebound, no ascites, no appreciable mass Extremities: No significant cyanosis, clubbing, or edema bilateral lower extremities  Data Reviewed: Basic Metabolic Panel:  Recent Labs Lab 03/14/14 2103  03/14/14 2344 03/15/14 0617 03/16/14 0540 03/17/14 0506 03/18/14 0500  NA 129*  --  137 142  140 140 138 135*  K 7.5*  < > 7.0* 3.9  3.9 4.5 4.1 4.3  CL 100  --  97 101  98 99 98 96  CO2  --   --  18* 23  21 23 26 22   GLUCOSE 238*  --  118* 100*  103* 145* 90 234*  BUN >140*  --  136* 61*  60* 74* 48* 61*  CREATININE 12.80*  < > 12.30* 6.22*  6.31* 8.25* 6.56* 8.20*  CALCIUM  --   --  8.8 8.5  8.5 7.8* 6.9* 7.0*  PHOS  --   --  11.4* 5.2*  --   --   --   < > = values in this interval not displayed. Liver Function Tests:  Recent Labs Lab 03/14/14 2038 03/14/14 2344 03/15/14 0617 03/16/14 0540 03/17/14 0506 03/18/14 0500  AST 216*  --  127* 60* 32 21  ALT 141*  --  122* 89* 57* 40  ALKPHOS 247*  --  223* 183* 141* 134*  BILITOT 0.5  --  0.6 0.5 0.6 0.4  PROT 6.2  --  6.0 5.9* 5.5* 5.6*  ALBUMIN 2.7* 2.8* 2.7*  2.7* 2.4* 2.3* 2.4*   No results found for this basename: LIPASE, AMYLASE,  in the last 168 hours  Recent Labs Lab 03/15/14 0100  AMMONIA 18   CBC:  Recent Labs Lab 03/14/14 2038  03/14/14 2252 03/15/14 0617 03/16/14 0540 03/17/14 0506 03/18/14 0500  WBC 4.5  --  4.3 5.6 4.2 4.7 5.2  NEUTROABS 4.2  --   --  4.7 3.1 3.3 4.1  HGB 8.9*  < > 8.9* 8.7* 8.3* 8.1* 8.2*  HCT 27.1*  < > 26.8* 26.1* 25.9* 25.2* 24.7*  MCV 97.1  --  96.1 96.3 98.5 97.7 98.4  PLT 176  --  179 185 186 192 176  < > = values in this interval not displayed. Cardiac Enzymes: No results found for this basename: CKTOTAL, CKMB, CKMBINDEX, TROPONINI,  in the last 168 hours BNP (last 3 results) No  results found for this basename: PROBNP,  in the last 8760 hours CBG:  Recent Labs Lab 03/18/14 1241 03/18/14 1625 03/18/14 2157 03/19/14 0811 03/19/14 1201  GLUCAP 181* 139* 104* 106* 168*    Recent Results (from the past 240 hour(s))  CULTURE, BLOOD (ROUTINE X 2)     Status: None   Collection Time    03/14/14  8:48 PM      Result Value Ref Range Status   Specimen Description BLOOD EJ   Final   Special Requests BOTTLES DRAWN AEROBIC AND ANAEROBIC 10CC EACH   Final   Culture  Setup Time     Final   Value: 03/15/2014 01:43     Performed at [facility]   Culture     Final   Value:        BLOOD CULTURE RECEIVED NO GROWTH TO DATE CULTURE WILL BE HELD FOR 5 DAYS BEFORE ISSUING A FINAL NEGATIVE REPORT     Performed at [facility]   Report Status PENDING   Incomplete  CULTURE, BLOOD (ROUTINE X 2)     Status: None   Collection Time    03/14/14  8:53 PM      Result Value Ref Range Status   Specimen Description BLOOD ARM RIGHT   Final   Special Requests BOTTLES DRAWN AEROBIC AND ANAEROBIC 10CC   Final   Culture  Setup Time     Final   Value: 03/15/2014 01:43     Performed at [facility]   Culture     Final   Value: STAPHYLOCOCCUS SPECIES (COAGULASE NEGATIVE)     Note: THE SIGNIFICANCE OF ISOLATING THIS ORGANISM FROM A SINGLE SET OF BLOOD CULTURES WHEN MULTIPLE SETS ARE DRAWN IS UNCERTAIN. PLEASE NOTIFY THE MICROBIOLOGY DEPARTMENT WITHIN ONE WEEK IF SPECIATION AND SENSITIVITIES ARE REQUIRED.     Note: Gram Stain Report Called to,Read Back By and Verified With: EMMANUEL CASTRO ON 03/15/2014 AT 8:20P BY Serafina Mitchell     Performed at [facility]   Report Status 03/18/2014 FINAL   Final  URINE CULTURE     Status: None   Collection Time    03/14/14  9:11 PM      Result Value Ref Range Status   Specimen Description URINE, CATHETERIZED   Final   Special Requests NONE   Final   Culture  Setup Time     Final   Value: 03/15/2014 04:38     Performed at [facility]  Colony Count     Final   Value: NO GROWTH     [facility]   Culture     Final   Value: NO GROWTH     [facility]   Report Status 03/16/2014 FINAL   Final  MRSA PCR SCREENING     Status: None   Collection Time    03/15/14  8:18 AM      Result Value Ref Range Status   MRSA by PCR NEGATIVE  NEGATIVE Final   Comment:            The GeneXpert MRSA Assay (FDA     approved for NASAL specimens     only), is one component of a     comprehensive MRSA colonization     surveillance program. It is not     intended to diagnose MRSA     infection nor to guide or     monitor treatment for     MRSA infections.     Studies:  Recent x-ray studies have been reviewed in detail by the Attending Physician  Scheduled Meds:  Scheduled Meds: . aspirin  81 mg Oral Daily  . atorvastatin  80 mg Oral QHS  . B-complex with vitamin C  1 tablet Oral Daily  . calcium acetate  2,001 mg Oral TID WC  . calcium acetate  667 mg Oral QHS  . carvedilol  25 mg Oral BID WC  . cefTAZidime (FORTAZ)  IV  2 g Intravenous Q T,Th,Sa-HD  . cloNIDine  0.2 mg Transdermal Weekly  . clopidogrel  75 mg Oral Daily  . darbepoetin (ARANESP) injection - DIALYSIS  60 mcg Intravenous Q Sat-HD  . feeding supplement (PRO-STAT SUGAR FREE 64)  30 mL Oral BID  . ferrous sulfate  325 mg Oral TID WC  . heparin  5,000 Units Subcutaneous 3 times per day  . hydrALAZINE  75 mg Oral TID  . insulin aspart  0-5 Units Subcutaneous QHS  . insulin aspart  0-9 Units Subcutaneous TID WC  . insulin glargine  6 Units Subcutaneous QHS  . isosorbide mononitrate  60 mg Oral Daily  . pantoprazole  40 mg Oral BID  . sertraline  100 mg Oral Daily   Continuous Infusions:   Time spent on care of this patient: 25 min   Calvert CantorSaima Hetal Proano, MD 03/19/2014, 3:42 PM  LOS: 5 days   [facility] Office  [phone] Pager - Text Page per Loretha StaplerAmion   If 7PM-7AM, please contact  night-coverage [email or web address]

## 2014-03-19 NOTE — Consult Note (Signed)
[facility] Face-to-Face Psychiatry Consult   Reason for Consult:  depression Referring Physician:  Dr. Cherlyn Labella Xiao is an 73 y.o. male. Total Time spent with patient: 1 hour  Assessment: AXIS I:  Depressive Disorder NOS AXIS II:  Deferred AXIS III:   Past Medical History  Diagnosis Date  . 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 Hypertension   . CAD (coronary artery disease)      with angioplasty  . 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 Cardiomyopathy     with ejection fraction of 45 %  . Heart failure, systolic and diastolic   . Anemia   . CHF (congestive heart failure)    AXIS IV:  other psychosocial or environmental problems AXIS V:  31-40 impairment in reality testing  Plan:  No evidence of imminent risk to self or others at present.   Patient does not meet criteria for psychiatric inpatient admission. Supportive therapy provided about ongoing stressors. Discussed crisis plan, support from social network, calling 911, coming to the Emergency Department, and calling Suicide Hotline.  Subjective:   Albert Grant is a 73 y.o. male patient admitted with encephalopathy. Pt was interviewed, chart reviewed. Nurse reports pt has been using fragmented sentences, and is confused off and on. Pt follows commands. Pt is complying with dialysis in hospital. Family has not come to hospital to meet the doctors at the scheduled times. Family was supposed to come today from 3p-5p, but did not show.  Pt knew is DOB, that he was in the hospital (did not know which one), that it is Sunday, May 2015. Interview was limited, d/t language barrier. Pt answered with few words. Pt stated he was here because "I'm sick". He knew he needed dialysis for his kidneys. Pt knew he lived in a nursing home, where he is mostly treated well. He denies sadness. Pt denies SI/HI/AVH.  Son was called for collateral info, who gave phone to daughter-in-law (since she is up-to-date on his tx). Pt  gave verbal consent for me to speak to son and daughter-in-law. Family reports pt has been more depressed for the past 2-3 yrs, stating he wants to die and wants to suffer. He reported VH at times. He has refused dialysis and meds at nursing home. No h/o suicide attempts or psychiatric care. No psych hospitalizations. Sleep/appetite good. Pt lives at nursing home ([facility]).   Past Psychiatric History: Past Medical History  Diagnosis Date  . 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 Hypertension   . CAD (coronary artery disease)      with angioplasty  . 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 Cardiomyopathy     with ejection fraction of 45 %  . Heart failure, systolic and diastolic   . Anemia   . CHF (congestive heart failure)     reports that he has never smoked. He has never used smokeless tobacco. He reports that he does not drink alcohol or use illicit drugs. History reviewed. No pertinent family history.         Allergies:   Allergies  Allergen Reactions  . Lisinopril Other (See Comments)    Per MAR     Objective: Blood pressure 136/59, pulse 71, temperature 99 F (37.2 C), temperature source Oral, resp. rate 16, height 5' 6"  (1.676 m), weight 62.4 kg (137 lb 9.1 oz), SpO2 99.00%.Body mass index  is 22.21 kg/(m^2). Results for orders placed during the hospital encounter of 03/14/14 (from the past 72 hour(s))  GLUCOSE, CAPILLARY     Status: Abnormal   Collection Time    03/16/14  8:29 PM      Result Value Ref Range   Glucose-Capillary 158 (*) 70 - 99 mg/dL  COMPREHENSIVE METABOLIC PANEL     Status: Abnormal   Collection Time    03/17/14  5:06 AM      Result Value Ref Range   Sodium 138  137 - 147 mEq/L   Potassium 4.1  3.7 - 5.3 mEq/L   Chloride 98  96 - 112 mEq/L   CO2 26  19 - 32 mEq/L   Glucose, Bld 90  70 - 99 mg/dL   BUN 48 (*) 6 - 23 mg/dL   Creatinine, Ser 6.56 (*) 0.50 - 1.35 mg/dL   Calcium 6.9 (*) 8.4 - 10.5 mg/dL   Total  Protein 5.5 (*) 6.0 - 8.3 g/dL   Albumin 2.3 (*) 3.5 - 5.2 g/dL   AST 32  0 - 37 U/L   ALT 57 (*) 0 - 53 U/L   Alkaline Phosphatase 141 (*) 39 - 117 U/L   Total Bilirubin 0.6  0.3 - 1.2 mg/dL   GFR calc non Af Amer 7 (*) >90 mL/min   GFR calc Af Amer 9 (*) >90 mL/min   Comment: (NOTE)     The eGFR has been calculated using the CKD EPI equation.     This calculation has not been validated in all clinical situations.     eGFR's persistently <90 mL/min signify possible Chronic Kidney     Disease.  CBC WITH DIFFERENTIAL     Status: Abnormal   Collection Time    03/17/14  5:06 AM      Result Value Ref Range   WBC 4.7  4.0 - 10.5 K/uL   RBC 2.58 (*) 4.22 - 5.81 MIL/uL   Hemoglobin 8.1 (*) 13.0 - 17.0 g/dL   HCT 25.2 (*) 39.0 - 52.0 %   MCV 97.7  78.0 - 100.0 fL   MCH 31.4  26.0 - 34.0 pg   MCHC 32.1  30.0 - 36.0 g/dL   RDW 13.8  11.5 - 15.5 %   Platelets 192  150 - 400 K/uL   Neutrophils Relative % 69  43 - 77 %   Neutro Abs 3.3  1.7 - 7.7 K/uL   Lymphocytes Relative 12  12 - 46 %   Lymphs Abs 0.6 (*) 0.7 - 4.0 K/uL   Monocytes Relative 12  3 - 12 %   Monocytes Absolute 0.6  0.1 - 1.0 K/uL   Eosinophils Relative 7 (*) 0 - 5 %   Eosinophils Absolute 0.3  0.0 - 0.7 K/uL   Basophils Relative 0  0 - 1 %   Basophils Absolute 0.0  0.0 - 0.1 K/uL  GLUCOSE, CAPILLARY     Status: Abnormal   Collection Time    03/17/14  7:49 AM      Result Value Ref Range   Glucose-Capillary 180 (*) 70 - 99 mg/dL  GLUCOSE, CAPILLARY     Status: Abnormal   Collection Time    03/17/14  6:54 PM      Result Value Ref Range   Glucose-Capillary 138 (*) 70 - 99 mg/dL  GLUCOSE, CAPILLARY     Status: Abnormal   Collection Time    03/17/14  9:41 PM  Result Value Ref Range   Glucose-Capillary 150 (*) 70 - 99 mg/dL  COMPREHENSIVE METABOLIC PANEL     Status: Abnormal   Collection Time    03/18/14  5:00 AM      Result Value Ref Range   Sodium 135 (*) 137 - 147 mEq/L   Potassium 4.3  3.7 - 5.3 mEq/L    Chloride 96  96 - 112 mEq/L   CO2 22  19 - 32 mEq/L   Glucose, Bld 234 (*) 70 - 99 mg/dL   BUN 61 (*) 6 - 23 mg/dL   Creatinine, Ser 8.20 (*) 0.50 - 1.35 mg/dL   Calcium 7.0 (*) 8.4 - 10.5 mg/dL   Total Protein 5.6 (*) 6.0 - 8.3 g/dL   Albumin 2.4 (*) 3.5 - 5.2 g/dL   AST 21  0 - 37 U/L   ALT 40  0 - 53 U/L   Alkaline Phosphatase 134 (*) 39 - 117 U/L   Total Bilirubin 0.4  0.3 - 1.2 mg/dL   GFR calc non Af Amer 6 (*) >90 mL/min   GFR calc Af Amer 7 (*) >90 mL/min   Comment: (NOTE)     The eGFR has been calculated using the CKD EPI equation.     This calculation has not been validated in all clinical situations.     eGFR's persistently <90 mL/min signify possible Chronic Kidney     Disease.  CBC WITH DIFFERENTIAL     Status: Abnormal   Collection Time    03/18/14  5:00 AM      Result Value Ref Range   WBC 5.2  4.0 - 10.5 K/uL   RBC 2.51 (*) 4.22 - 5.81 MIL/uL   Hemoglobin 8.2 (*) 13.0 - 17.0 g/dL   HCT 24.7 (*) 39.0 - 52.0 %   MCV 98.4  78.0 - 100.0 fL   MCH 32.7  26.0 - 34.0 pg   MCHC 33.2  30.0 - 36.0 g/dL   RDW 14.0  11.5 - 15.5 %   Platelets 176  150 - 400 K/uL   Neutrophils Relative % 79 (*) 43 - 77 %   Neutro Abs 4.1  1.7 - 7.7 K/uL   Lymphocytes Relative 8 (*) 12 - 46 %   Lymphs Abs 0.4 (*) 0.7 - 4.0 K/uL   Monocytes Relative 11  3 - 12 %   Monocytes Absolute 0.6  0.1 - 1.0 K/uL   Eosinophils Relative 2  0 - 5 %   Eosinophils Absolute 0.1  0.0 - 0.7 K/uL   Basophils Relative 0  0 - 1 %   Basophils Absolute 0.0  0.0 - 0.1 K/uL  PROCALCITONIN     Status: None   Collection Time    03/18/14  5:00 AM      Result Value Ref Range   Procalcitonin 1.22     Comment:            Interpretation:     PCT > 0.5 ng/mL and <= 2 ng/mL:     Systemic infection (sepsis) is possible,     but other conditions are known to elevate     PCT as well.     (NOTE)             ICU PCT Algorithm               Non ICU PCT Algorithm        ----------------------------      ------------------------------  PCT < 0.25 ng/mL                 PCT < 0.1 ng/mL         Stopping of antibiotics            Stopping of antibiotics           strongly encouraged.               strongly encouraged.        ----------------------------     ------------------------------           PCT level decrease by               PCT < 0.25 ng/mL           >= 80% from peak PCT           OR PCT 0.25 - 0.5 ng/mL          Stopping of antibiotics                                                 encouraged.         Stopping of antibiotics               encouraged.        ----------------------------     ------------------------------           PCT level decrease by              PCT >= 0.25 ng/mL           < 80% from peak PCT            AND PCT >= 0.5 ng/mL            Continuing antibiotics                                                  encouraged.           Continuing antibiotics                encouraged.        ----------------------------     ------------------------------         PCT level increase compared          PCT > 0.5 ng/mL             with peak PCT AND              PCT >= 0.5 ng/mL             Escalation of antibiotics                                              strongly encouraged.          Escalation of antibiotics            strongly encouraged.  GLUCOSE, CAPILLARY     Status: Abnormal   Collection Time    03/18/14  8:10 AM      Result Value Ref Range   Glucose-Capillary 110 (*) 70 -  99 mg/dL  GLUCOSE, CAPILLARY     Status: Abnormal   Collection Time    03/18/14 11:10 AM      Result Value Ref Range   Glucose-Capillary 221 (*) 70 - 99 mg/dL  GLUCOSE, CAPILLARY     Status: Abnormal   Collection Time    03/18/14 12:41 PM      Result Value Ref Range   Glucose-Capillary 181 (*) 70 - 99 mg/dL  GLUCOSE, CAPILLARY     Status: Abnormal   Collection Time    03/18/14  4:25 PM      Result Value Ref Range   Glucose-Capillary 139 (*) 70 - 99 mg/dL  GLUCOSE,  CAPILLARY     Status: Abnormal   Collection Time    03/18/14  9:57 PM      Result Value Ref Range   Glucose-Capillary 104 (*) 70 - 99 mg/dL  GLUCOSE, CAPILLARY     Status: Abnormal   Collection Time    03/19/14  8:11 AM      Result Value Ref Range   Glucose-Capillary 106 (*) 70 - 99 mg/dL  GLUCOSE, CAPILLARY     Status: Abnormal   Collection Time    03/19/14 12:01 PM      Result Value Ref Range   Glucose-Capillary 168 (*) 70 - 99 mg/dL  GLUCOSE, CAPILLARY     Status: Abnormal   Collection Time    03/19/14  5:18 PM      Result Value Ref Range   Glucose-Capillary 134 (*) 70 - 99 mg/dL   Labs are reviewed and are pertinent for see previous notes.  Current Facility-Administered Medications  Medication Dose Route Frequency Provider Last Rate Last Dose  . aspirin chewable tablet 81 mg  81 mg Oral Daily Debbe Odea, MD   81 mg at 03/18/14 1324  . atorvastatin (LIPITOR) tablet 80 mg  80 mg Oral QHS Shanda Howells, MD   80 mg at 03/18/14 2234  . B-complex with vitamin C tablet 1 tablet  1 tablet Oral Daily Debbe Odea, MD   1 tablet at 03/19/14 0945  . calcium acetate (PHOSLO) capsule 2,001 mg  2,001 mg Oral TID WC Shanda Howells, MD   2,001 mg at 03/19/14 0946  . calcium acetate (PHOSLO) capsule 667 mg  667 mg Oral QHS Debbe Odea, MD   667 mg at 03/17/14 2136  . carvedilol (COREG) tablet 25 mg  25 mg Oral BID WC Shanda Howells, MD   25 mg at 03/19/14 0945  . cefTAZidime (FORTAZ) 2 g in dextrose 5 % 50 mL IVPB  2 g Intravenous Q T,Th,Sa-HD Jeronimo Norma, RPH   2 g at 03/16/14 1026  . cloNIDine (CATAPRES - Dosed in mg/24 hr) patch 0.2 mg  0.2 mg Transdermal Weekly Shanda Howells, MD      . clopidogrel (PLAVIX) tablet 75 mg  75 mg Oral Daily Shanda Howells, MD   75 mg at 03/19/14 0946  . darbepoetin (ARANESP) injection 60 mcg  60 mcg Intravenous Q Sat-HD Estanislado Emms, MD   60 mcg at 03/18/14 2043  . feeding supplement (PRO-STAT SUGAR FREE 64) liquid 30 mL  30 mL Oral BID Shanda Howells, MD   30  mL at 03/18/14 1328  . ferrous sulfate tablet 325 mg  325 mg Oral TID WC Shanda Howells, MD   325 mg at 03/19/14 0946  . guaifenesin (ROBITUSSIN) 100 MG/5ML syrup 200 mg  200 mg Oral Q4H PRN Shanda Howells, MD      .  heparin injection 5,000 Units  5,000 Units Subcutaneous 3 times per day Shanda Howells, MD   5,000 Units at 03/19/14 1442  . hydrALAZINE (APRESOLINE) tablet 75 mg  75 mg Oral TID Shanda Howells, MD   75 mg at 03/19/14 0945  . hydrOXYzine (ATARAX/VISTARIL) tablet 25 mg  25 mg Oral Q4H PRN Shanda Howells, MD      . insulin aspart (novoLOG) injection 0-5 Units  0-5 Units Subcutaneous QHS Debbe Odea, MD   2 Units at 03/15/14 2244  . insulin aspart (novoLOG) injection 0-9 Units  0-9 Units Subcutaneous TID WC Debbe Odea, MD   1 Units at 03/19/14 1720  . insulin glargine (LANTUS) injection 6 Units  6 Units Subcutaneous QHS Debbe Odea, MD   6 Units at 03/18/14 2300  . isosorbide mononitrate (IMDUR) 24 hr tablet 60 mg  60 mg Oral Daily Shanda Howells, MD   60 mg at 03/19/14 0946  . labetalol (NORMODYNE,TRANDATE) injection 10 mg  10 mg Intravenous Q2H PRN Debbe Odea, MD   10 mg at 03/18/14 2202  . mirtazapine (REMERON) tablet 7.5 mg  7.5 mg Oral QHS Chastin Garlitz P Tylia Ewell, MD      . nitroGLYCERIN (NITROSTAT) SL tablet 0.4 mg  0.4 mg Sublingual Q5 min PRN Shanda Howells, MD      . ondansetron [facility]) tablet 4 mg  4 mg Oral Q8H PRN Shanda Howells, MD      . pantoprazole (PROTONIX) EC tablet 40 mg  40 mg Oral BID Shanda Howells, MD   40 mg at 03/19/14 0946  . sertraline (ZOLOFT) tablet 100 mg  100 mg Oral Daily Shanda Howells, MD   100 mg at 03/19/14 0946    Psychiatric Specialty Exam: Physical Exam  ROS  Blood pressure 136/59, pulse 71, temperature 99 F (37.2 C), temperature source Oral, resp. rate 16, height 5' 6"  (1.676 m), weight 62.4 kg (137 lb 9.1 oz), SpO2 99.00%.Body mass index is 22.21 kg/(m^2).  General Appearance: Fairly Groomed  Engineer, water::  Minimal  Speech:  Slow  Volume:  Decreased   Mood:  Depressed  Affect:  Congruent and Depressed  Thought Process:  Goal Directed  Orientation:  Full (Time, Place, and Person)  Thought Content:  Negative  Suicidal Thoughts:  No  Homicidal Thoughts:  No  Memory:  Negative  Judgement:  Intact  Insight:  Present  Psychomotor Activity:  Decreased  Concentration:  Fair  Recall:  [place] of Knowledge:Fair  Language: Poor  Akathisia:  Negative  Handed:  Right  AIMS (if indicated):     Assets:  Resilience  Sleep:      Musculoskeletal: Strength & Muscle Tone: lying in bed Gait & Station: lying in bed Patient leans: N/A  Treatment Plan Summary: Pt has been complying with treatment in hospital, so he appears to have capacity to make medical decisions at this time. Delirium appears to be resolving. Will add remeron 7.5 mg qhs to augment for depression. Spoke to Dr. Wynelle Cleveland, who stated that pt will likely be d/c'ed to nursing home tomorrow. Will try to arrange for family meeting prior to discharge. I would recommend that pt f/u with consulting psychiatrist at nursing home, if available.  Skip Estimable 03/19/2014 7:20 PM

## 2014-03-20 DIAGNOSIS — F3289 Other specified depressive episodes: Secondary | ICD-10-CM

## 2014-03-20 DIAGNOSIS — F329 Major depressive disorder, single episode, unspecified: Secondary | ICD-10-CM

## 2014-03-20 LAB — GLUCOSE, CAPILLARY
Glucose-Capillary: 116 mg/dL — ABNORMAL HIGH (ref 70–99)
Glucose-Capillary: 149 mg/dL — ABNORMAL HIGH (ref 70–99)

## 2014-03-20 LAB — RENAL FUNCTION PANEL
Albumin: 2.6 g/dL — ABNORMAL LOW (ref 3.5–5.2)
BUN: 47 mg/dL — AB (ref 6–23)
CHLORIDE: 97 meq/L (ref 96–112)
CO2: 25 mEq/L (ref 19–32)
Calcium: 7.7 mg/dL — ABNORMAL LOW (ref 8.4–10.5)
Creatinine, Ser: 6.97 mg/dL — ABNORMAL HIGH (ref 0.50–1.35)
GFR calc Af Amer: 8 mL/min — ABNORMAL LOW (ref >90)
GFR calc non Af Amer: 7 mL/min — ABNORMAL LOW (ref >90)
GLUCOSE: 128 mg/dL — AB (ref 70–99)
PHOSPHORUS: 5.8 mg/dL — AB (ref 2.3–4.6)
Potassium: 4.6 mEq/L (ref 3.7–5.3)
Sodium: 136 mEq/L — ABNORMAL LOW (ref 137–147)

## 2014-03-20 MED ORDER — INSULIN GLARGINE 100 UNIT/ML ~~LOC~~ SOLN: 6.0000 [IU] | Freq: Every day | SUBCUTANEOUS | Status: DC

## 2014-03-20 MED ORDER — MIRTAZAPINE 7.5 MG PO TABS: 7.5000 mg | ORAL_TABLET | Freq: Every day | ORAL | Status: DC

## 2014-03-20 MED ORDER — INSULIN ASPART 100 UNIT/ML ~~LOC~~ SOLN: 0.0000 [IU] | Freq: Every day | SUBCUTANEOUS | Status: DC

## 2014-03-20 MED ORDER — INSULIN ASPART 100 UNIT/ML ~~LOC~~ SOLN: 0.0000 [IU] | Freq: Three times a day (TID) | SUBCUTANEOUS | Status: DC

## 2014-03-20 NOTE — Progress Notes (Signed)
ANTIBIOTIC CONSULT NOTE - FOLLOW UP  Pharmacy Consult for Ceftazidime Indication: R/O sepsis, recent PSA bacteremia  Allergies  Allergen Reactions  . Lisinopril Other (See Comments)    Per [facility]    Patient Measurements: Height: 5\' 6"  (167.6 cm) Weight: 140 lb 14 oz (63.9 kg) IBW/kg (Calculated) : 63.8 Adjusted Body Weight:   Vital Signs: Temp: 97.2 F (36.2 C) (05/11 0736) Temp src: Oral (05/11 0736) BP: 175/73 mmHg (05/11 0736) Pulse Rate: 74 (05/11 0736) Intake/Output from previous day:   Intake/Output from this shift:    Labs:  Recent Labs  03/18/14 0500  WBC 5.2  HGB 8.2*  PLT 176  CREATININE 8.20*   Estimated Creatinine Clearance: 7.1 ml/min (by C-G formula based on Cr of 8.2). No results found for this basename: VANCOTROUGH, VANCOPEAK, VANCORANDOM, GENTTROUGH, GENTPEAK, GENTRANDOM, TOBRATROUGH, TOBRAPEAK, TOBRARND, AMIKACINPEAK, AMIKACINTROU, AMIKACIN,  in the last 72 hours   Microbiology: Recent Results (from the past 720 hour(s))  CULTURE, BLOOD (ROUTINE X 2)     Status: None   Collection Time    03/14/14  8:48 PM      Result Value Ref Range Status   Specimen Description BLOOD EJ   Final   Special Requests BOTTLES DRAWN AEROBIC AND ANAEROBIC 10CC EACH   Final   Culture  Setup Time     Final   Value: 03/15/2014 01:43     Performed at [facility]   Culture     Final   Value:        BLOOD CULTURE RECEIVED NO GROWTH TO DATE CULTURE WILL BE HELD FOR 5 DAYS BEFORE ISSUING A FINAL NEGATIVE REPORT     Performed at [facility]   Report Status PENDING   Incomplete  CULTURE, BLOOD (ROUTINE X 2)     Status: None   Collection Time    03/14/14  8:53 PM      Result Value Ref Range Status   Specimen Description BLOOD ARM RIGHT   Final   Special Requests BOTTLES DRAWN AEROBIC AND ANAEROBIC 10CC   Final   Culture  Setup Time     Final   Value: 03/15/2014 01:43     Performed at [facility]   Culture     Final   Value: STAPHYLOCOCCUS  SPECIES (COAGULASE NEGATIVE)     Note: THE SIGNIFICANCE OF ISOLATING THIS ORGANISM FROM A SINGLE SET OF BLOOD CULTURES WHEN MULTIPLE SETS ARE DRAWN IS UNCERTAIN. PLEASE NOTIFY THE MICROBIOLOGY DEPARTMENT WITHIN ONE WEEK IF SPECIATION AND SENSITIVITIES ARE REQUIRED.     Note: Gram Stain Report Called to,Read Back By and Verified With: EMMANUEL CASTRO ON 03/15/2014 AT 8:20P BY Serafina Mitchell     Performed at [facility]   Report Status 03/18/2014 FINAL   Final  URINE CULTURE     Status: None   Collection Time    03/14/14  9:11 PM      Result Value Ref Range Status   Specimen Description URINE, CATHETERIZED   Final   Special Requests NONE   Final   Culture  Setup Time     Final   Value: 03/15/2014 04:38     Performed at [facility]   Colony Count     Final   Value: NO GROWTH     Performed at [facility]   Culture     Final   Value: NO GROWTH     Performed at [facility]   Report Status 03/16/2014 FINAL  Final  MRSA PCR SCREENING     Status: None   Collection Time    03/15/14  8:18 AM      Result Value Ref Range Status   MRSA by PCR NEGATIVE  NEGATIVE Final   Comment:            The GeneXpert MRSA Assay (FDA     approved for NASAL specimens     only), is one component of a     comprehensive MRSA colonization     surveillance program. It is not     intended to diagnose MRSA     infection nor to guide or     monitor treatment for     MRSA infections.    Anti-infectives   Start     Dose/Rate Route Frequency Ordered Stop   03/16/14 1200  cefTAZidime (FORTAZ) 2 g in dextrose 5 % 50 mL IVPB     2 g 100 mL/hr over 30 Minutes Intravenous Every T-Th-Sa (Hemodialysis) 03/15/14 1233     03/15/14 1400  piperacillin-tazobactam (ZOSYN) IVPB 2.25 g  Status:  Discontinued     2.25 g 100 mL/hr over 30 Minutes Intravenous 3 times per day 03/15/14 0438 03/15/14 1216   03/15/14 0445  vancomycin (VANCOCIN) 500 mg in sodium chloride 0.9 % 100 mL IVPB     500 mg 100  mL/hr over 60 Minutes Intravenous  Once 03/15/14 0438 03/15/14 0648   03/14/14 2300  vancomycin (VANCOCIN) IVPB 1000 mg/200 mL premix     1,000 mg 200 mL/hr over 60 Minutes Intravenous  Once 03/14/14 2259 03/15/14 0433   03/14/14 2300  piperacillin-tazobactam (ZOSYN) IVPB 3.375 g     3.375 g 100 mL/hr over 30 Minutes Intravenous  Once 03/14/14 2259 03/15/14 0618      Assessment: 73yo male ESRD-HD TTS on day# 6 of Fortaz.  #1/2 blood cx (+)Coag neg staph, a probable contaminant.  Pt is AFeb, and WBC wnl on 5/9.  Goal of Therapy:  resolution of infection  Plan:  1-  Continue Fortaz 2gm IV qHD 2-  Please define planned length of antibiotic therapy  Marisue HumbleKendra Coolidge Gossard, PharmD Clinical Pharmacist [facility]

## 2014-03-20 NOTE — Progress Notes (Signed)
Palliative medicine team scheduled goals of care meeting with patient's family for 5PM. Family did not show up. Will continue to try to assist with patient and difficult barriers.  Anderson Malta, DO Palliative Medicine

## 2014-03-20 NOTE — Discharge Summary (Signed)
Physician Discharge Summary  Albert Grant ZOX:[id number] DOB: 02/25/1941 DOA: 03/14/2014  PCP: No PCP Per Patient  Admit date: 03/14/2014 Discharge date: 03/20/2014  Time spent: >45 minutes  Recommendations for Outpatient Follow-up:  1. Will need Hospice/ palliative care consult if he refuses dialysis or his mediacations 2. Will need strict f/u with psych as oupt for treatment of severe depression  Discharge Diagnoses:  Principal Problem:   Acute encephalopathy Active Problems:   End stage renal disease   Pseudomonas sepsis   DM type 2 causing renal disease   Hyperkalemia   Discharge Condition: stable  Diet recommendation: renal diet, diabetic diet  Filed Weights   03/16/14 1115 03/18/14 2122 03/19/14 2017  Weight: 61.7 kg (136 lb 0.4 oz) 62.4 kg (137 lb 9.1 oz) 63.9 kg (140 lb 14 oz)    History of present illness:  Albert Grant is a 73 y.o. male presenting on 03/14/2014 with ESRD non-compliant with dialysis, recent pseudomonas bacteremia, DM2 presenting with encephalopathy after refusing dialysis x 2.   Hospital Course:  Principal Problem:  Acute encephalopathy  - due to skipping multiple dialysis treatments - resolved with dialysis   Active Problems:  End stage renal disease  - cont dialysis for now-  - have asked palliative care to address GOC and whether he wants to continue on dialysis - family was difficult to reach and was only able to come on Sunday. We were told they would come a 5 PM and unfortunately they did not show or call us back the following day- in fact no family has come to see this patient in the days he has been here in the hospital   Depression/ refusal of dialysis and medications - has said multiple times "I just want to die"  - this depression is likely due to the fact that he is in a nursing home with very little family interaction and the fact that he has significant difficulty with ambulation and is unable to get around on his own now - spending most of  the day sleeping in his bed - for now has resumed going to dialysis and is intermittently taking his medications.  - consulted psych for competency eval and for treatment of depression- he has limited english and a proper competency eval could not be done by the psychiatrist. He (Dr Tawni Carnes) did recommend starting him on a low dose of Remeron.  This would be in addition to the Zoloft he is already taking. Recommend the continue to see psych as outpt  Hyperkalemia  - due to skipping multiple dialysis treatments - corrected with dailysis   Pseudomonas sepsis  - due to dialysis cath that was infected - has ben receiving [place] with dialysis-- - according to calculation by our pharmacy, it appears he has received 14 days already- will discontinue [place] today   DM type 2 causing renal disease  - will place on sliding scale   Procedures:  none  Consultations:  Nephrology  Palliative care  Psychiatry  Discharge Exam: Filed Vitals:   03/20/14 0736  BP: 175/73  Pulse: 74  Temp: 97.2 F (36.2 C)  Resp: 18   General: No acute respiratory distress  Lungs: Clear to auscultation bilaterally without wheezes or crackles  Cardiovascular: Regular rate and rhythm without murmur gallop or rub normal S1 and S2  Abdomen: Nontender, nondistended, soft, bowel sounds positive, no rebound, no ascites, no appreciable mass  Extremities: No significant cyanosis, clubbing, or edema bilateral lower extremities   Discharge Instructions You  were cared for by a hospitalist during your hospital stay. If you have any questions about your discharge medications or the care you received while you were in the hospital after you are discharged, you can call the unit and asked to speak with the hospitalist on call if the hospitalist that took care of you is not available. Once you are discharged, your primary care physician will handle any further medical issues. Please note that NO REFILLS for any discharge  medications will be authorized once you are discharged, as it is imperative that you return to your primary care physician (or establish a relationship with a primary care physician if you do not have one) for your aftercare needs so that they can reassess your need for medications and monitor your lab values.  Discharge Orders   Future Orders Complete By Expires   Diet - low sodium heart healthy  As directed    Increase activity slowly  As directed        Medication List    STOP taking these medications       cefTAZidime 2 G injection  Commonly known as:  FORTAZ      TAKE these medications       aspirin 81 MG tablet  Take 81 mg by mouth daily.     atorvastatin 80 MG tablet  Commonly known as:  LIPITOR  Take 80 mg by mouth at bedtime. For hypercholesterolemia     carvedilol 25 MG tablet  Commonly known as:  COREG  Take 25 mg by mouth 2 (two) times daily with a meal. For HTN (10am & 10pm)     cloNIDine 0.3 mg/24hr patch  Commonly known as:  CATAPRES - Dosed in mg/24 hr  Place 0.2 mg onto the skin once a week.     clopidogrel 75 MG tablet  Commonly known as:  PLAVIX  Take 75 mg by mouth daily.     feeding supplement (PRO-STAT SUGAR FREE 64) Liqd  Take 30 mLs by mouth 2 (two) times daily. 10am and 10pm     ferrous sulfate 325 (65 FE) MG tablet  Take 325 mg by mouth 3 (three) times daily with meals. 10am, 2pm, 10pm     guaifenesin 100 MG/5ML syrup  Commonly known as:  ROBITUSSIN  Take 200 mg by mouth every 4 (four) hours as needed for cough.     hydrALAZINE 50 MG tablet  Commonly known as:  APRESOLINE  Take 75 mg by mouth 3 (three) times daily. For HTN (10am, 2pm, 10pm)     hydrOXYzine 25 MG tablet  Commonly known as:  ATARAX/VISTARIL  Take 25 mg by mouth every 4 (four) hours as needed for anxiety.     insulin aspart 100 UNIT/ML injection  Commonly known as:  novoLOG  Inject 0-5 Units into the skin at bedtime.     insulin aspart 100 UNIT/ML injection  Commonly  known as:  novoLOG  Inject 0-9 Units into the skin 3 (three) times daily with meals.     insulin glargine 100 UNIT/ML injection  Commonly known as:  LANTUS  Inject 0.06 mLs (6 Units total) into the skin at bedtime.     isosorbide mononitrate 60 MG 24 hr tablet  Commonly known as:  IMDUR  Take 60 mg by mouth daily. For HTN     mirtazapine 7.5 MG tablet  Commonly known as:  REMERON  Take 1 tablet (7.5 mg total) by mouth at bedtime.     nitroGLYCERIN 0.4  MG SL tablet  Commonly known as:  NITROSTAT  Place 0.4 mg under the tongue every 5 (five) minutes as needed for chest pain (May repeat for 3 doses as needed for chest pain. If chest pain persist call the MD.).     ondansetron 4 MG tablet  Commonly known as:  ZOFRAN  Take 4 mg by mouth every 8 (eight) hours as needed for nausea or vomiting.     oxyCODONE-acetaminophen 5-325 MG per tablet  Commonly known as:  PERCOCET/ROXICET  Take 1 tablet by mouth every 6 (six) hours as needed for severe pain (pain).     pantoprazole 40 MG tablet  Commonly known as:  PROTONIX  Take 40 mg by mouth 2 (two) times daily. For esophageal reflux (6:30am & 4:30pm)     PHOSLO 667 MG capsule  Generic drug:  calcium acetate  Take 667-2,001 mg by mouth 4 (four) times daily - after meals and at bedtime. Take 3 capsules (2001 mg) 3 times daily with meals, take 1 capsule (667 mg) every night at bedtime with snack     PRESCRIPTION MEDICATION  Take 1 packet by mouth daily. Novosource (supplement)     PRESCRIPTION MEDICATION  Apply 1 mg topically 2 (two) times daily as needed (anxiety/agitation). Lorazepam 1 mg/ml gel     sertraline 100 MG tablet  Commonly known as:  ZOLOFT  Take 100 mg by mouth daily. For mood     SUPER B COMPLEX Tabs  Take 1 tablet by mouth daily.       Allergies  Allergen Reactions  . Lisinopril Other (See Comments)    Per MAR      The results of significant diagnostics from this hospitalization (including imaging,  microbiology, ancillary and laboratory) are listed below for reference.    Significant Diagnostic Studies: Dg Chest Port 1 View  03/14/2014   CLINICAL DATA:  Bradycardia, hypotension.  EXAM: PORTABLE CHEST - 1 VIEW  COMPARISON:  DG CHEST 2 VIEW dated 01/16/2014  FINDINGS: The cardiac silhouette appears moderately enlarged, similar. And mediastinal silhouette is nonsuspicious, mildly calcified. Mild central pulmonary vasculature congestion with decreased from prior examination with interval removal of dialysis catheter. No pleural effusions or focal consolidations though, the costophrenic angles are incompletely imaged. No pneumothorax.  Symmetric calcifications in the neck are likely vasculature. Multiple EKG lines overlie the patient and may obscure subtle underlying pathology. Soft tissue planes and included osseous structures are nonsuspicious.  IMPRESSION: Stable cardiomegaly, decreased central pulmonary vasculature congestion. Interval removal of dialysis catheter.   Electronically Signed   By: Awilda Metro   On: 03/14/2014 22:04    Microbiology: Recent Results (from the past 240 hour(s))  CULTURE, BLOOD (ROUTINE X 2)     Status: None   Collection Time    03/14/14  8:48 PM      Result Value Ref Range Status   Specimen Description BLOOD EJ   Final   Special Requests BOTTLES DRAWN AEROBIC AND ANAEROBIC 10CC EACH   Final   Culture  Setup Time     Final   Value: 03/15/2014 01:43     Performed at [facility]   Culture     Final   Value:        BLOOD CULTURE RECEIVED NO GROWTH TO DATE CULTURE WILL BE HELD FOR 5 DAYS BEFORE ISSUING A FINAL NEGATIVE REPORT     Performed at [facility]   Report Status PENDING   Incomplete  CULTURE, BLOOD (ROUTINE X 2)  Status: None   Collection Time    03/14/14  8:53 PM      Result Value Ref Range Status   Specimen Description BLOOD ARM RIGHT   Final   Special Requests BOTTLES DRAWN AEROBIC AND ANAEROBIC 10CC   Final   Culture  Setup  Time     Final   Value: 03/15/2014 01:43     Performed at [facility]   Culture     Final   Value: STAPHYLOCOCCUS SPECIES (COAGULASE NEGATIVE)     Note: THE SIGNIFICANCE OF ISOLATING THIS ORGANISM FROM A SINGLE SET OF BLOOD CULTURES WHEN MULTIPLE SETS ARE DRAWN IS UNCERTAIN. PLEASE NOTIFY THE MICROBIOLOGY DEPARTMENT WITHIN ONE WEEK IF SPECIATION AND SENSITIVITIES ARE REQUIRED.     Note: Gram Stain Report Called to,Read Back By and Verified With: EMMANUEL CASTRO ON 03/15/2014 AT 8:20P BY Serafina Mitchell     Performed at [facility]   Report Status 03/18/2014 FINAL   Final  URINE CULTURE     Status: None   Collection Time    03/14/14  9:11 PM      Result Value Ref Range Status   Specimen Description URINE, CATHETERIZED   Final   Special Requests NONE   Final   Culture  Setup Time     Final   Value: 03/15/2014 04:38     Performed at [facility]   Colony Count     Final   Value: NO GROWTH     Performed at [facility]   Culture     Final   Value: NO GROWTH     Performed at [facility]   Report Status 03/16/2014 FINAL   Final  MRSA PCR SCREENING     Status: None   Collection Time    03/15/14  8:18 AM      Result Value Ref Range Status   MRSA by PCR NEGATIVE  NEGATIVE Final   Comment:            The GeneXpert MRSA Assay (FDA     approved for NASAL specimens     only), is one component of a     comprehensive MRSA colonization     surveillance program. It is not     intended to diagnose MRSA     infection nor to guide or     monitor treatment for     MRSA infections.     Labs: Basic Metabolic Panel:  Recent Labs Lab 03/14/14 2103  03/14/14 2344 03/15/14 0617 03/16/14 0540 03/17/14 0506 03/18/14 0500 03/20/14 0845  NA 129*  --  137 142  140 140 138 135* 136*  K 7.5*  < > 7.0* 3.9  3.9 4.5 4.1 4.3 4.6  CL 100  --  97 101  98 99 98 96 97  CO2  --   --  18* 23  21 23 26 22 25   GLUCOSE 238*  --  118* 100*  103* 145* 90 234* 128*  BUN  >140*  --  136* 61*  60* 74* 48* 61* 47*  CREATININE 12.80*  < > 12.30* 6.22*  6.31* 8.25* 6.56* 8.20* 6.97*  CALCIUM  --   --  8.8 8.5  8.5 7.8* 6.9* 7.0* 7.7*  PHOS  --   --  11.4* 5.2*  --   --   --  5.8*  < > = values in this interval not displayed. Liver Function Tests:  Recent Labs Lab 03/14/14 2038  03/15/14 0617 03/16/14 0540 03/17/14 0506 03/18/14 0500 03/20/14 0845  AST 216*  --  127* 60* 32 21  --   ALT 141*  --  122* 89* 57* 40  --   ALKPHOS 247*  --  223* 183* 141* 134*  --   BILITOT 0.5  --  0.6 0.5 0.6 0.4  --   PROT 6.2  --  6.0 5.9* 5.5* 5.6*  --   ALBUMIN 2.7*  < > 2.7*  2.7* 2.4* 2.3* 2.4* 2.6*  < > = values in this interval not displayed. No results found for this basename: LIPASE, AMYLASE,  in the last 168 hours  Recent Labs Lab 03/15/14 0100  AMMONIA 18   CBC:  Recent Labs Lab 03/14/14 2038  03/14/14 2252 03/15/14 0617 03/16/14 0540 03/17/14 0506 03/18/14 0500  WBC 4.5  --  4.3 5.6 4.2 4.7 5.2  NEUTROABS 4.2  --   --  4.7 3.1 3.3 4.1  HGB 8.9*  < > 8.9* 8.7* 8.3* 8.1* 8.2*  HCT 27.1*  < > 26.8* 26.1* 25.9* 25.2* 24.7*  MCV 97.1  --  96.1 96.3 98.5 97.7 98.4  PLT 176  --  179 185 186 192 176  < > = values in this interval not displayed. Cardiac Enzymes: No results found for this basename: CKTOTAL, CKMB, CKMBINDEX, TROPONINI,  in the last 168 hours BNP: BNP (last 3 results) No results found for this basename: PROBNP,  in the last 8760 hours CBG:  Recent Labs Lab 03/19/14 0811 03/19/14 1201 03/19/14 1718 03/19/14 2019 03/20/14 0734  GLUCAP 106* 168* 134* 164* 116*       Signed:  [name], MD [facility] 03/20/2014, 10:15 AM

## 2014-03-20 NOTE — Clinical Social Work Psychosocial (Signed)
Clinical Social Work Department BRIEF PSYCHOSOCIAL ASSESSMENT AND DISCHARGE NOTE 03/20/2014   Patient:  Albert Grant, Albert Grant     Account Number:  [id number]     Admit date:  03/14/2014  Clinical Social Worker:  Delmer Islam  Date/Time:  03/20/2014 04:18 AM  Referred by:  Physician  Date Referred:  03/20/2014 Referred for  SNF Placement   Other Referral:   Interview type:  Family Other interview type:   CSW spoke by phone with patient's son Albert Grant ([phone])    PSYCHOSOCIAL DATA Living Status:  FACILITY Admitted from facility:  [facility] Level of care:  Skilled Nursing Facility Primary support name:  Albert Grant Primary support relationship to patient:  CHILD, ADULT Degree of support available:   Patient also has a daughter-in-law Albert Grant [phone]). Family apprears supportive.    CURRENT CONCERNS Current Concerns  Post-Acute Placement   Other Concerns:    SOCIAL WORK ASSESSMENT / PLAN Patient ready for discharge and son contacted to confirm return to [facility]. Per Mr. Redder, his dad will return to [facility]. Son also made aware that patient will be transported by ambulance and he is in agreement.   Assessment/plan status:  No Further Intervention Required Other assessment/ plan:   Information/referral to community resources:   None needed or requested at this time.    PATIENT'S/FAMILY'S RESPONSE TO PLAN OF CARE: Son in agreement with patient returning to [facility] 03/20/14. Discharge paperwork transmitted to facility and patient will be transported by ambulance.

## 2014-03-20 NOTE — Progress Notes (Signed)
S:No new CO O:BP 175/73  Pulse 74  Temp(Src) 97.2 F (36.2 C) (Oral)  Resp 18  Ht 5\' 6"  (1.676 m)  Wt 63.9 kg (140 lb 14 oz)  BMI 22.75 kg/m2  SpO2 97% No intake or output data in the 24 hours ending 03/20/14 1444 Weight change: 1.5 kg (3 lb 4.9 oz) WUX:LKGMW and alert CVS:RRR Resp:clear Abd:+ BS NTND Ext:no edema  LUA AVF + bruit NEURO:CNI No asterixis   . aspirin  81 mg Oral Daily  . atorvastatin  80 mg Oral QHS  . B-complex with vitamin C  1 tablet Oral Daily  . calcium acetate  2,001 mg Oral TID WC  . calcium acetate  667 mg Oral QHS  . carvedilol  25 mg Oral BID WC  . cefTAZidime (FORTAZ)  IV  2 g Intravenous Q T,Th,Sa-HD  . cloNIDine  0.2 mg Transdermal Weekly  . clopidogrel  75 mg Oral Daily  . darbepoetin (ARANESP) injection - DIALYSIS  60 mcg Intravenous Q Sat-HD  . feeding supplement (PRO-STAT SUGAR FREE 64)  30 mL Oral BID  . ferrous sulfate  325 mg Oral TID WC  . heparin  5,000 Units Subcutaneous 3 times per day  . hydrALAZINE  75 mg Oral TID  . insulin aspart  0-5 Units Subcutaneous QHS  . insulin aspart  0-9 Units Subcutaneous TID WC  . insulin glargine  6 Units Subcutaneous QHS  . isosorbide mononitrate  60 mg Oral Daily  . mirtazapine  7.5 mg Oral QHS  . pantoprazole  40 mg Oral BID  . sertraline  100 mg Oral Daily   No results found. BMET    Component Value Date/Time   NA 136* 03/20/2014 0845   K 4.6 03/20/2014 0845   CL 97 03/20/2014 0845   CO2 25 03/20/2014 0845   GLUCOSE 128* 03/20/2014 0845   BUN 47* 03/20/2014 0845   CREATININE 6.97* 03/20/2014 0845   CALCIUM 7.7* 03/20/2014 0845   GFRNONAA 7* 03/20/2014 0845   GFRAA 8* 03/20/2014 0845   CBC    Component Value Date/Time   WBC 5.2 03/18/2014 0500   RBC 2.51* 03/18/2014 0500   HGB 8.2* 03/18/2014 0500   HCT 24.7* 03/18/2014 0500   PLT 176 03/18/2014 0500   MCV 98.4 03/18/2014 0500   MCH 32.7 03/18/2014 0500   MCHC 33.2 03/18/2014 0500   RDW 14.0 03/18/2014 0500   LYMPHSABS 0.4* 03/18/2014 0500   MONOABS 0.6  03/18/2014 0500   EOSABS 0.1 03/18/2014 0500   BASOSABS 0.0 03/18/2014 0500     Assessment: 1. ESRD 2. Depression 3. Anemia  Plan: 1. Note plans for DC.  He can FU with his HD unit in [place]

## 2014-03-20 NOTE — Progress Notes (Signed)
Pt continues to refuse all AM medications, refused am vital check. Allowed CBG checks. Pt is alert and oriented x4, in NAD, skin warm and dry. Will continue to monitor.

## 2014-03-20 NOTE — Progress Notes (Signed)
Report called to [facility] at [phone]. [facility] here to transport pt back to facility.

## 2014-03-21 ENCOUNTER — Other Ambulatory Visit: Payer: Self-pay | Admitting: *Deleted

## 2014-03-21 LAB — CULTURE, BLOOD (ROUTINE X 2): Culture: NO GROWTH

## 2014-03-21 MED ORDER — OXYCODONE-ACETAMINOPHEN 5-325 MG PO TABS: ORAL_TABLET | ORAL | Status: DC

## 2014-03-21 MED ORDER — AMBULATORY NON FORMULARY MEDICATION: Status: DC

## 2014-03-21 NOTE — Telephone Encounter (Signed)
[facility] 

## 2014-04-03 ENCOUNTER — Non-Acute Institutional Stay (SKILLED_NURSING_FACILITY): Payer: Medicare Other | Admitting: Internal Medicine

## 2014-04-03 DIAGNOSIS — D631 Anemia in chronic kidney disease: Secondary | ICD-10-CM

## 2014-04-03 DIAGNOSIS — I119 Hypertensive heart disease without heart failure: Secondary | ICD-10-CM

## 2014-04-03 DIAGNOSIS — E1129 Type 2 diabetes mellitus with other diabetic kidney complication: Secondary | ICD-10-CM

## 2014-04-03 DIAGNOSIS — N039 Chronic nephritic syndrome with unspecified morphologic changes: Secondary | ICD-10-CM

## 2014-04-03 DIAGNOSIS — E785 Hyperlipidemia, unspecified: Secondary | ICD-10-CM

## 2014-04-03 NOTE — Progress Notes (Signed)
         PROGRESS NOTE  DATE: 04/03/2014  FACILITY: Nursing Home Location: [facility]  LEVEL OF CARE: SNF (31)  Routine Visit  CHIEF COMPLAINT:  Manage diabetes mellitus, hypertension and hyperlipidemia  HISTORY OF PRESENT ILLNESS:  REASSESSMENT OF ONGOING PROBLEM(S):  DM:pt's DM remains stable.  Staff deny polyuria, polydipsia, polyphagia, changes in vision or hypoglycemic episodes.  No complications noted from the medication presently being used.  Last hemoglobin A1c is:6.7 in 2-15. Patient is a poor historian.  HTN: Pt 's HTN remains stable.  Denies CP, sob, DOE, pedal edema, headaches, dizziness or visual disturbances.  No complications from the medications currently being used.  Last BP : 136/68.  HYPERLIPIDEMIA: No complications from the medications presently being used. Last fasting lipid panel was normal in 2-15.  PAST MEDICAL HISTORY : Reviewed.  No changes/see problem list  CURRENT MEDICATIONS: Reviewed per MAR/see medication list  REVIEW OF SYSTEMS: Unobtainable due to patient being a poor historian  PHYSICAL EXAMINATION  VS:  See VS section  GENERAL: no acute distress, normal body habitus EYES: conjunctivae normal, sclerae normal, normal eye lids NECK: supple, trachea midline, no neck masses, no thyroid tenderness, no thyromegaly LYMPHATICS: no LAN in the neck, no supraclavicular LAN RESPIRATORY: breathing is even & unlabored, BS CTAB CARDIAC: RRR, no murmur,no extra heart sounds, no edema GI: abdomen soft, normal BS, no masses, no tenderness, no hepatomegaly, no splenomegaly PSYCHIATRIC: the patient is alert & oriented to person, affect & behavior appropriate  LABS/RADIOLOGY:  5-15 hemoglobin 10.3, MCV 97 otherwise CBC normal, BUN 70, creatinine 8.54, total protein 5.9, albumin 3.3, alkaline phosphatase 172 otherwise CMP normal   ASSESSMENT/PLAN:  Diabetes mellitus with renal complications-well controlled Renovascular  hypertension-stable Hyperlipidemia-well controlled Anemia of chronic kidney disease-continue iron CAD-stable End-stage renal disease-on hemodialysis GERD-continue PPI Hyperphosphatemia-continue PhosLo Depression-continue Zoloft  CPT CODE: 87579  Angela Cox, MD [facility] [phone]

## 2014-04-05 ENCOUNTER — Encounter (HOSPITAL_COMMUNITY): Payer: Self-pay | Admitting: *Deleted

## 2014-04-05 ENCOUNTER — Inpatient Hospital Stay (HOSPITAL_COMMUNITY): Payer: Medicare Other

## 2014-04-05 ENCOUNTER — Inpatient Hospital Stay (HOSPITAL_COMMUNITY)
Admission: EM | Admit: 2014-04-05 | Discharge: 2014-04-11 | DRG: 682 | Disposition: A | Discharge status: Skilled Nursing Facility | Payer: Medicare Other | Attending: Internal Medicine | Admitting: Internal Medicine

## 2014-04-05 ENCOUNTER — Emergency Department (HOSPITAL_COMMUNITY): Payer: Medicare Other

## 2014-04-05 DIAGNOSIS — F0280 Dementia in other diseases classified elsewhere without behavioral disturbance: Secondary | ICD-10-CM | POA: Diagnosis present

## 2014-04-05 DIAGNOSIS — N186 End stage renal disease: Secondary | ICD-10-CM

## 2014-04-05 DIAGNOSIS — I509 Heart failure, unspecified: Secondary | ICD-10-CM | POA: Diagnosis present

## 2014-04-05 DIAGNOSIS — I2489 Other forms of acute ischemic heart disease: Secondary | ICD-10-CM | POA: Diagnosis present

## 2014-04-05 DIAGNOSIS — E440 Moderate protein-calorie malnutrition: Secondary | ICD-10-CM | POA: Insufficient documentation

## 2014-04-05 DIAGNOSIS — R110 Nausea: Secondary | ICD-10-CM

## 2014-04-05 DIAGNOSIS — Z9115 Patient's noncompliance with renal dialysis: Secondary | ICD-10-CM

## 2014-04-05 DIAGNOSIS — F329 Major depressive disorder, single episode, unspecified: Secondary | ICD-10-CM | POA: Diagnosis present

## 2014-04-05 DIAGNOSIS — L299 Pruritus, unspecified: Secondary | ICD-10-CM

## 2014-04-05 DIAGNOSIS — F32A Depression, unspecified: Secondary | ICD-10-CM

## 2014-04-05 DIAGNOSIS — G9340 Encephalopathy, unspecified: Secondary | ICD-10-CM | POA: Diagnosis present

## 2014-04-05 DIAGNOSIS — R4182 Altered mental status, unspecified: Secondary | ICD-10-CM | POA: Diagnosis present

## 2014-04-05 DIAGNOSIS — R1110 Vomiting, unspecified: Secondary | ICD-10-CM

## 2014-04-05 DIAGNOSIS — R5381 Other malaise: Secondary | ICD-10-CM

## 2014-04-05 DIAGNOSIS — I120 Hypertensive chronic kidney disease with stage 5 chronic kidney disease or end stage renal disease: Principal | ICD-10-CM | POA: Diagnosis present

## 2014-04-05 DIAGNOSIS — Z66 Do not resuscitate: Secondary | ICD-10-CM | POA: Diagnosis present

## 2014-04-05 DIAGNOSIS — E785 Hyperlipidemia, unspecified: Secondary | ICD-10-CM

## 2014-04-05 DIAGNOSIS — E1129 Type 2 diabetes mellitus with other diabetic kidney complication: Secondary | ICD-10-CM

## 2014-04-05 DIAGNOSIS — I5042 Chronic combined systolic (congestive) and diastolic (congestive) heart failure: Secondary | ICD-10-CM | POA: Diagnosis present

## 2014-04-05 DIAGNOSIS — L298 Other pruritus: Secondary | ICD-10-CM

## 2014-04-05 DIAGNOSIS — N039 Chronic nephritic syndrome with unspecified morphologic changes: Secondary | ICD-10-CM

## 2014-04-05 DIAGNOSIS — A4152 Sepsis due to Pseudomonas: Secondary | ICD-10-CM

## 2014-04-05 DIAGNOSIS — J690 Pneumonitis due to inhalation of food and vomit: Secondary | ICD-10-CM | POA: Diagnosis present

## 2014-04-05 DIAGNOSIS — Z7189 Other specified counseling: Secondary | ICD-10-CM

## 2014-04-05 DIAGNOSIS — I119 Hypertensive heart disease without heart failure: Secondary | ICD-10-CM

## 2014-04-05 DIAGNOSIS — F72 Severe intellectual disabilities: Secondary | ICD-10-CM | POA: Diagnosis present

## 2014-04-05 DIAGNOSIS — I428 Other cardiomyopathies: Secondary | ICD-10-CM | POA: Diagnosis present

## 2014-04-05 DIAGNOSIS — I2510 Atherosclerotic heart disease of native coronary artery without angina pectoris: Secondary | ICD-10-CM | POA: Diagnosis present

## 2014-04-05 DIAGNOSIS — I248 Other forms of acute ischemic heart disease: Secondary | ICD-10-CM | POA: Diagnosis present

## 2014-04-05 DIAGNOSIS — D631 Anemia in chronic kidney disease: Secondary | ICD-10-CM

## 2014-04-05 DIAGNOSIS — F3289 Other specified depressive episodes: Secondary | ICD-10-CM | POA: Diagnosis present

## 2014-04-05 DIAGNOSIS — J9601 Acute respiratory failure with hypoxia: Secondary | ICD-10-CM

## 2014-04-05 DIAGNOSIS — R112 Nausea with vomiting, unspecified: Secondary | ICD-10-CM | POA: Diagnosis present

## 2014-04-05 DIAGNOSIS — Z91158 Patient's noncompliance with renal dialysis for other reason: Secondary | ICD-10-CM

## 2014-04-05 DIAGNOSIS — K219 Gastro-esophageal reflux disease without esophagitis: Secondary | ICD-10-CM | POA: Diagnosis present

## 2014-04-05 DIAGNOSIS — Z515 Encounter for palliative care: Secondary | ICD-10-CM

## 2014-04-05 DIAGNOSIS — J9600 Acute respiratory failure, unspecified whether with hypoxia or hypercapnia: Secondary | ICD-10-CM | POA: Diagnosis present

## 2014-04-05 DIAGNOSIS — E119 Type 2 diabetes mellitus without complications: Secondary | ICD-10-CM | POA: Diagnosis present

## 2014-04-05 DIAGNOSIS — D72819 Decreased white blood cell count, unspecified: Secondary | ICD-10-CM | POA: Diagnosis present

## 2014-04-05 DIAGNOSIS — I498 Other specified cardiac arrhythmias: Secondary | ICD-10-CM | POA: Diagnosis present

## 2014-04-05 DIAGNOSIS — T7840XA Allergy, unspecified, initial encounter: Secondary | ICD-10-CM

## 2014-04-05 DIAGNOSIS — Z7982 Long term (current) use of aspirin: Secondary | ICD-10-CM

## 2014-04-05 DIAGNOSIS — Z992 Dependence on renal dialysis: Secondary | ICD-10-CM

## 2014-04-05 DIAGNOSIS — R5383 Other fatigue: Secondary | ICD-10-CM

## 2014-04-05 DIAGNOSIS — K761 Chronic passive congestion of liver: Secondary | ICD-10-CM | POA: Diagnosis present

## 2014-04-05 DIAGNOSIS — R531 Weakness: Secondary | ICD-10-CM

## 2014-04-05 DIAGNOSIS — I255 Ischemic cardiomyopathy: Secondary | ICD-10-CM

## 2014-04-05 DIAGNOSIS — Z794 Long term (current) use of insulin: Secondary | ICD-10-CM

## 2014-04-05 DIAGNOSIS — D638 Anemia in other chronic diseases classified elsewhere: Secondary | ICD-10-CM | POA: Diagnosis present

## 2014-04-05 DIAGNOSIS — R9431 Abnormal electrocardiogram [ECG] [EKG]: Secondary | ICD-10-CM

## 2014-04-05 DIAGNOSIS — I959 Hypotension, unspecified: Secondary | ICD-10-CM | POA: Diagnosis present

## 2014-04-05 DIAGNOSIS — E875 Hyperkalemia: Secondary | ICD-10-CM | POA: Diagnosis present

## 2014-04-05 DIAGNOSIS — R001 Bradycardia, unspecified: Secondary | ICD-10-CM

## 2014-04-05 DIAGNOSIS — F0390 Unspecified dementia without behavioral disturbance: Secondary | ICD-10-CM | POA: Diagnosis present

## 2014-04-05 DIAGNOSIS — N2581 Secondary hyperparathyroidism of renal origin: Secondary | ICD-10-CM | POA: Diagnosis present

## 2014-04-05 LAB — BASIC METABOLIC PANEL
BUN: 157 mg/dL — ABNORMAL HIGH (ref 6–23)
BUN: 77 mg/dL — ABNORMAL HIGH (ref 6–23)
CHLORIDE: 95 meq/L — AB (ref 96–112)
CHLORIDE: 97 meq/L (ref 96–112)
CO2: 15 mEq/L — ABNORMAL LOW (ref 19–32)
CO2: 24 mEq/L (ref 19–32)
Calcium: 10.3 mg/dL (ref 8.4–10.5)
Calcium: 9.1 mg/dL (ref 8.4–10.5)
Creatinine, Ser: 12.94 mg/dL — ABNORMAL HIGH (ref 0.50–1.35)
Creatinine, Ser: 6.67 mg/dL — ABNORMAL HIGH (ref 0.50–1.35)
GFR calc Af Amer: 8 mL/min — ABNORMAL LOW (ref >90)
GFR calc non Af Amer: 3 mL/min — ABNORMAL LOW (ref >90)
GFR calc non Af Amer: 7 mL/min — ABNORMAL LOW (ref >90)
GFR, EST AFRICAN AMERICAN: 4 mL/min — AB (ref >90)
Glucose, Bld: 256 mg/dL — ABNORMAL HIGH (ref 70–99)
Glucose, Bld: 107 mg/dL — ABNORMAL HIGH (ref 70–99)
POTASSIUM: 7.5 meq/L — AB (ref 3.7–5.3)
POTASSIUM: 4.4 meq/L (ref 3.7–5.3)
Sodium: 137 mEq/L (ref 137–147)
Sodium: 140 mEq/L (ref 137–147)

## 2014-04-05 LAB — CBC WITH DIFFERENTIAL/PLATELET
BASOS ABS: 0 10*3/uL (ref 0.0–0.1)
Basophils Relative: 0 % (ref 0–1)
EOS PCT: 0 % (ref 0–5)
Eosinophils Absolute: 0 10*3/uL (ref 0.0–0.7)
HEMATOCRIT: 28.2 % — AB (ref 39.0–52.0)
Hemoglobin: 8.9 g/dL — ABNORMAL LOW (ref 13.0–17.0)
Lymphocytes Relative: 7 % — ABNORMAL LOW (ref 12–46)
Lymphs Abs: 0.3 10*3/uL — ABNORMAL LOW (ref 0.7–4.0)
MCH: 32.6 pg (ref 26.0–34.0)
MCHC: 31.6 g/dL (ref 30.0–36.0)
MCV: 103.3 fL — AB (ref 78.0–100.0)
MONO ABS: 0.1 10*3/uL (ref 0.1–1.0)
Monocytes Relative: 3 % (ref 3–12)
Neutro Abs: 3.4 10*3/uL (ref 1.7–7.7)
Neutrophils Relative %: 90 % — ABNORMAL HIGH (ref 43–77)
PLATELETS: 178 10*3/uL (ref 150–400)
RBC: 2.73 MIL/uL — ABNORMAL LOW (ref 4.22–5.81)
RDW: 15.2 % (ref 11.5–15.5)
WBC: 3.7 10*3/uL — ABNORMAL LOW (ref 4.0–10.5)

## 2014-04-05 LAB — COMPREHENSIVE METABOLIC PANEL
ALT: 458 U/L — AB (ref 0–53)
AST: 452 U/L — ABNORMAL HIGH (ref 0–37)
Albumin: 3.1 g/dL — ABNORMAL LOW (ref 3.5–5.2)
Alkaline Phosphatase: 257 U/L — ABNORMAL HIGH (ref 39–117)
BILIRUBIN TOTAL: 0.7 mg/dL (ref 0.3–1.2)
BUN: 154 mg/dL — ABNORMAL HIGH (ref 6–23)
CO2: 14 mEq/L — ABNORMAL LOW (ref 19–32)
Calcium: 9.3 mg/dL (ref 8.4–10.5)
Chloride: 93 mEq/L — ABNORMAL LOW (ref 96–112)
Creatinine, Ser: 12.77 mg/dL — ABNORMAL HIGH (ref 0.50–1.35)
GFR calc Af Amer: 4 mL/min — ABNORMAL LOW (ref >90)
GFR calc non Af Amer: 3 mL/min — ABNORMAL LOW (ref >90)
Glucose, Bld: 215 mg/dL — ABNORMAL HIGH (ref 70–99)
Potassium: >7.7 mEq/L (ref 3.7–5.3)
SODIUM: 136 meq/L — AB (ref 137–147)
TOTAL PROTEIN: 6.8 g/dL (ref 6.0–8.3)

## 2014-04-05 LAB — I-STAT CHEM 8, ED
BUN: >140 mg/dL — ABNORMAL HIGH (ref 6–23)
CALCIUM ION: 1.14 mmol/L (ref 1.13–1.30)
CHLORIDE: 101 meq/L (ref 96–112)
Creatinine, Ser: 13.6 mg/dL — ABNORMAL HIGH (ref 0.50–1.35)
Glucose, Bld: 208 mg/dL — ABNORMAL HIGH (ref 70–99)
HEMATOCRIT: 29 % — AB (ref 39.0–52.0)
HEMOGLOBIN: 9.9 g/dL — AB (ref 13.0–17.0)
Potassium: 8 mEq/L (ref 3.7–5.3)
Sodium: 134 mEq/L — ABNORMAL LOW (ref 137–147)
TCO2: 16 mmol/L (ref 0–100)

## 2014-04-05 LAB — CBC
HCT: 25.4 % — ABNORMAL LOW (ref 39.0–52.0)
Hemoglobin: 8.4 g/dL — ABNORMAL LOW (ref 13.0–17.0)
MCH: 33.3 pg (ref 26.0–34.0)
MCHC: 33.1 g/dL (ref 30.0–36.0)
MCV: 100.8 fL — ABNORMAL HIGH (ref 78.0–100.0)
Platelets: 187 10*3/uL (ref 150–400)
RBC: 2.52 MIL/uL — ABNORMAL LOW (ref 4.22–5.81)
RDW: 15.1 % (ref 11.5–15.5)
WBC: 4.2 10*3/uL (ref 4.0–10.5)

## 2014-04-05 LAB — I-STAT TROPONIN, ED: Troponin i, poc: 0.13 ng/mL (ref 0.00–0.08)

## 2014-04-05 LAB — TROPONIN I: Troponin I: <0.3 ng/mL (ref <0.30)

## 2014-04-05 LAB — URINALYSIS, ROUTINE W REFLEX MICROSCOPIC
BILIRUBIN URINE: NEGATIVE
Glucose, UA: 250 mg/dL — AB
KETONES UR: NEGATIVE mg/dL
Leukocytes, UA: NEGATIVE
Nitrite: NEGATIVE
PH: 5 (ref 5.0–8.0)
Protein, ur: >300 mg/dL — AB
SPECIFIC GRAVITY, URINE: 1.024 (ref 1.005–1.030)
Urobilinogen, UA: 0.2 mg/dL (ref 0.0–1.0)

## 2014-04-05 LAB — GLUCOSE, CAPILLARY
GLUCOSE-CAPILLARY: 202 mg/dL — AB (ref 70–99)
GLUCOSE-CAPILLARY: 151 mg/dL — AB (ref 70–99)
Glucose-Capillary: 100 mg/dL — ABNORMAL HIGH (ref 70–99)
Glucose-Capillary: 72 mg/dL (ref 70–99)
Glucose-Capillary: 136 mg/dL — ABNORMAL HIGH (ref 70–99)
Glucose-Capillary: 101 mg/dL — ABNORMAL HIGH (ref 70–99)

## 2014-04-05 LAB — I-STAT CG4 LACTIC ACID, ED: LACTIC ACID, VENOUS: 1.67 mmol/L (ref 0.5–2.2)

## 2014-04-05 LAB — LACTIC ACID, PLASMA: Lactic Acid, Venous: 2.2 mmol/L (ref 0.5–2.2)

## 2014-04-05 LAB — URINE MICROSCOPIC-ADD ON

## 2014-04-05 LAB — MAGNESIUM: MAGNESIUM: 4.4 mg/dL — AB (ref 1.5–2.5)

## 2014-04-05 LAB — MRSA PCR SCREENING: MRSA by PCR: POSITIVE — AB

## 2014-04-05 LAB — PHOSPHORUS
Phosphorus: 15.5 mg/dL — ABNORMAL HIGH (ref 2.3–4.6)
Phosphorus: 8.3 mg/dL — ABNORMAL HIGH (ref 2.3–4.6)
Phosphorus: 9.5 mg/dL — ABNORMAL HIGH (ref 2.3–4.6)

## 2014-04-05 LAB — PROCALCITONIN: PROCALCITONIN: 3.94 ng/mL

## 2014-04-05 LAB — CBG MONITORING, ED
GLUCOSE-CAPILLARY: 249 mg/dL — AB (ref 70–99)
Glucose-Capillary: 193 mg/dL — ABNORMAL HIGH (ref 70–99)

## 2014-04-05 LAB — AMMONIA: Ammonia: 33 umol/L (ref 11–60)

## 2014-04-05 MED ORDER — HYDRALAZINE HCL 50 MG PO TABS
75.0000 mg | ORAL_TABLET | Freq: Three times a day (TID) | ORAL | Status: DC
  Administered 2014-04-05 (×2): 75 mg via ORAL
  Filled 2014-04-05 (×5): qty 1

## 2014-04-05 MED ORDER — INSULIN ASPART 100 UNIT/ML ~~LOC~~ SOLN
2.0000 [IU] | SUBCUTANEOUS | Status: DC
  Administered 2014-04-05: 4 [IU] via SUBCUTANEOUS
  Administered 2014-04-05: 2 [IU] via SUBCUTANEOUS
  Administered 2014-04-05: 8 [IU] via SUBCUTANEOUS
  Administered 2014-04-06 (×3): 2 [IU] via SUBCUTANEOUS
  Administered 2014-04-06 – 2014-04-08 (×3): 4 [IU] via SUBCUTANEOUS

## 2014-04-05 MED ORDER — CARVEDILOL 3.125 MG PO TABS
3.1250 mg | ORAL_TABLET | Freq: Two times a day (BID) | ORAL | Status: DC
  Administered 2014-04-05 (×2): 3.125 mg via ORAL
  Filled 2014-04-05 (×5): qty 1

## 2014-04-05 MED ORDER — NEPRO/CARBSTEADY PO LIQD: 237.0000 mL | ORAL | Status: DC | PRN

## 2014-04-05 MED ORDER — NEPRO/CARBSTEADY PO LIQD
1000.0000 mL | ORAL | Status: DC
  Administered 2014-04-05: 1000 mL via ORAL
  Filled 2014-04-05 (×3): qty 1000

## 2014-04-05 MED ORDER — PANTOPRAZOLE SODIUM 40 MG PO PACK
40.0000 mg | PACK | Freq: Every day | ORAL | Status: DC
  Administered 2014-04-05 – 2014-04-06 (×2): 40 mg
  Filled 2014-04-05 (×3): qty 20

## 2014-04-05 MED ORDER — SODIUM BICARBONATE 8.4 % IV SOLN: 50.0000 meq | Freq: Once | INTRAVENOUS | Status: AC

## 2014-04-05 MED ORDER — PANTOPRAZOLE SODIUM 40 MG IV SOLR
40.0000 mg | Freq: Two times a day (BID) | INTRAVENOUS | Status: DC
  Filled 2014-04-05 (×3): qty 40

## 2014-04-05 MED ORDER — CLOPIDOGREL BISULFATE 75 MG PO TABS
75.0000 mg | ORAL_TABLET | Freq: Every day | ORAL | Status: DC
  Administered 2014-04-05 – 2014-04-11 (×6): 75 mg via ORAL
  Filled 2014-04-05 (×7): qty 1

## 2014-04-05 MED ORDER — BIOTENE DRY MOUTH MT LIQD
15.0000 mL | Freq: Four times a day (QID) | OROMUCOSAL | Status: DC
  Administered 2014-04-05 – 2014-04-06 (×6): 15 mL via OROMUCOSAL

## 2014-04-05 MED ORDER — SERTRALINE HCL 100 MG PO TABS
100.0000 mg | ORAL_TABLET | Freq: Every day | ORAL | Status: DC
  Administered 2014-04-05 – 2014-04-11 (×6): 100 mg via ORAL
  Filled 2014-04-05 (×7): qty 1

## 2014-04-05 MED ORDER — MAGNESIUM SULFATE 40 MG/ML IJ SOLN
2.0000 g | Freq: Once | INTRAMUSCULAR | Status: AC
  Administered 2014-04-05: 4 g via INTRAVENOUS

## 2014-04-05 MED ORDER — ASPIRIN 81 MG PO CHEW
81.0000 mg | CHEWABLE_TABLET | Freq: Every day | ORAL | Status: DC
  Administered 2014-04-06 – 2014-04-11 (×5): 81 mg via ORAL
  Filled 2014-04-05 (×5): qty 1

## 2014-04-05 MED ORDER — MIRTAZAPINE 15 MG PO TABS
15.0000 mg | ORAL_TABLET | Freq: Every day | ORAL | Status: DC
  Administered 2014-04-05 – 2014-04-09 (×4): 15 mg via ORAL
  Filled 2014-04-05 (×7): qty 1

## 2014-04-05 MED ORDER — CHLORHEXIDINE GLUCONATE 0.12 % MT SOLN
15.0000 mL | Freq: Two times a day (BID) | OROMUCOSAL | Status: DC
  Administered 2014-04-05 – 2014-04-06 (×4): 15 mL via OROMUCOSAL
  Filled 2014-04-05 (×4): qty 15

## 2014-04-05 MED ORDER — SODIUM POLYSTYRENE SULFONATE 15 GM/60ML PO SUSP: 60.0000 g | Freq: Once | ORAL | Status: AC

## 2014-04-05 MED ORDER — HEPARIN SODIUM (PORCINE) 5000 UNIT/ML IJ SOLN
5000.0000 [IU] | Freq: Three times a day (TID) | INTRAMUSCULAR | Status: DC
  Administered 2014-04-05 – 2014-04-10 (×12): 5000 [IU] via SUBCUTANEOUS
  Filled 2014-04-05 (×22): qty 1

## 2014-04-05 MED ORDER — SODIUM CHLORIDE 0.9 % IV SOLN: 100.0000 mL | INTRAVENOUS | Status: DC | PRN

## 2014-04-05 MED ORDER — ROCURONIUM BROMIDE 50 MG/5ML IV SOLN
65.0000 mg | Freq: Once | INTRAVENOUS | Status: AC
  Administered 2014-04-05: 60 mg via INTRAVENOUS
  Filled 2014-04-05: qty 6.5

## 2014-04-05 MED ORDER — HEPARIN SODIUM (PORCINE) 1000 UNIT/ML DIALYSIS
1000.0000 [IU] | INTRAMUSCULAR | Status: DC | PRN
  Filled 2014-04-05: qty 1

## 2014-04-05 MED ORDER — ROCURONIUM BROMIDE 50 MG/5ML IV SOLN
INTRAVENOUS | Status: AC
  Filled 2014-04-05: qty 2

## 2014-04-05 MED ORDER — INSULIN ASPART 100 UNIT/ML IV SOLN: 8.0000 [IU] | Freq: Once | INTRAVENOUS | Status: DC

## 2014-04-05 MED ORDER — LIDOCAINE HCL (CARDIAC) 20 MG/ML IV SOLN
INTRAVENOUS | Status: AC
  Filled 2014-04-05: qty 5

## 2014-04-05 MED ORDER — FAMOTIDINE IN NACL 20-0.9 MG/50ML-% IV SOLN: 20.0000 mg | Freq: Two times a day (BID) | INTRAVENOUS | Status: DC

## 2014-04-05 MED ORDER — SODIUM CHLORIDE 0.9 % IV SOLN
1.0000 g | Freq: Once | INTRAVENOUS | Status: AC
  Administered 2014-04-05: 1 g via INTRAVENOUS
  Filled 2014-04-05: qty 10

## 2014-04-05 MED ORDER — SODIUM CHLORIDE 0.9 % IV SOLN
INTRAVENOUS | Status: DC
  Administered 2014-04-05: 06:00:00 via INTRAVENOUS

## 2014-04-05 MED ORDER — ALBUTEROL SULFATE (2.5 MG/3ML) 0.083% IN NEBU
5.0000 mg | INHALATION_SOLUTION | Freq: Once | RESPIRATORY_TRACT | Status: DC
  Filled 2014-04-05: qty 6

## 2014-04-05 MED ORDER — FENTANYL BOLUS VIA INFUSION
100.0000 ug | Freq: Once | INTRAVENOUS | Status: DC
  Filled 2014-04-05: qty 100

## 2014-04-05 MED ORDER — MUPIROCIN 2 % EX OINT
1.0000 "application " | TOPICAL_OINTMENT | Freq: Two times a day (BID) | CUTANEOUS | Status: AC
  Administered 2014-04-05 – 2014-04-07 (×5): 1 via NASAL
  Filled 2014-04-05: qty 22

## 2014-04-05 MED ORDER — MAGNESIUM SULFATE 40 MG/ML IJ SOLN
1.0000 g | Freq: Once | INTRAMUSCULAR | Status: DC
  Filled 2014-04-05: qty 50

## 2014-04-05 MED ORDER — SODIUM BICARBONATE 8.4 % IV SOLN
50.0000 meq | Freq: Once | INTRAVENOUS | Status: AC
  Administered 2014-04-05: 50 meq via INTRAVENOUS

## 2014-04-05 MED ORDER — SODIUM CHLORIDE 0.9 % IV SOLN: 250.0000 mL | INTRAVENOUS | Status: DC | PRN

## 2014-04-05 MED ORDER — ETOMIDATE 2 MG/ML IV SOLN
INTRAVENOUS | Status: AC
  Filled 2014-04-05: qty 20

## 2014-04-05 MED ORDER — PIPERACILLIN-TAZOBACTAM IN DEX 2-0.25 GM/50ML IV SOLN
2.2500 g | Freq: Three times a day (TID) | INTRAVENOUS | Status: DC
  Administered 2014-04-05 – 2014-04-07 (×8): 2.25 g via INTRAVENOUS
  Filled 2014-04-05 (×11): qty 50

## 2014-04-05 MED ORDER — SUCCINYLCHOLINE CHLORIDE 20 MG/ML IJ SOLN
INTRAMUSCULAR | Status: AC
  Filled 2014-04-05: qty 1

## 2014-04-05 MED ORDER — ALBUTEROL (5 MG/ML) CONTINUOUS INHALATION SOLN
10.0000 mg/h | INHALATION_SOLUTION | Freq: Once | RESPIRATORY_TRACT | Status: AC
  Administered 2014-04-05: 10 mg/h via RESPIRATORY_TRACT

## 2014-04-05 MED ORDER — SODIUM CHLORIDE 0.9 % IV SOLN
2.0000 mg/h | INTRAVENOUS | Status: DC
  Administered 2014-04-05: 2 mg/h via INTRAVENOUS
  Filled 2014-04-05: qty 10

## 2014-04-05 MED ORDER — SODIUM CHLORIDE 0.9 % IV SOLN
50.0000 ug/h | INTRAVENOUS | Status: DC
  Administered 2014-04-05: 50 ug/h via INTRAVENOUS
  Filled 2014-04-05: qty 50

## 2014-04-05 MED ORDER — DEXMEDETOMIDINE HCL IN NACL 200 MCG/50ML IV SOLN
0.4000 ug/kg/h | INTRAVENOUS | Status: DC
  Administered 2014-04-05: 0.2 ug/kg/h via INTRAVENOUS
  Administered 2014-04-05 (×2): 0.8 ug/kg/h via INTRAVENOUS
  Administered 2014-04-06 (×2): 1 ug/kg/h via INTRAVENOUS
  Filled 2014-04-05 (×5): qty 50

## 2014-04-05 MED ORDER — ONDANSETRON HCL 4 MG/2ML IJ SOLN
4.0000 mg | Freq: Once | INTRAMUSCULAR | Status: DC
  Filled 2014-04-05: qty 2

## 2014-04-05 MED ORDER — CHLORHEXIDINE GLUCONATE CLOTH 2 % EX PADS: 6.0000 | MEDICATED_PAD | Freq: Every day | CUTANEOUS | Status: DC

## 2014-04-05 MED ORDER — INSULIN ASPART 100 UNIT/ML ~~LOC~~ SOLN
8.0000 [IU] | Freq: Once | SUBCUTANEOUS | Status: AC
  Filled 2014-04-05: qty 1

## 2014-04-05 MED ORDER — ETOMIDATE 2 MG/ML IV SOLN
15.0000 mg | Freq: Once | INTRAVENOUS | Status: AC
  Administered 2014-04-05: 15 mg via INTRAVENOUS

## 2014-04-05 MED ORDER — PRO-STAT SUGAR FREE PO LIQD
30.0000 mL | Freq: Two times a day (BID) | ORAL | Status: DC
Start: 2014-04-05 — End: 2014-04-06
  Administered 2014-04-05 – 2014-04-06 (×3): 30 mL
  Filled 2014-04-05 (×5): qty 30

## 2014-04-05 MED ORDER — ATORVASTATIN CALCIUM 80 MG PO TABS
80.0000 mg | ORAL_TABLET | Freq: Every day | ORAL | Status: DC
  Administered 2014-04-05 – 2014-04-11 (×6): 80 mg via ORAL
  Filled 2014-04-05 (×7): qty 1

## 2014-04-05 MED ORDER — VITAL HIGH PROTEIN PO LIQD
1000.0000 mL | ORAL | Status: DC
  Filled 2014-04-05 (×2): qty 1000

## 2014-04-05 MED ORDER — DEXTROSE 50 % IV SOLN
50.0000 mL | Freq: Once | INTRAVENOUS | Status: AC
  Administered 2014-04-05: 50 mL via INTRAVENOUS
  Filled 2014-04-05: qty 50

## 2014-04-05 MED ORDER — INSULIN ASPART 100 UNIT/ML ~~LOC~~ SOLN
8.0000 [IU] | Freq: Once | SUBCUTANEOUS | Status: AC
  Administered 2014-04-05: 8 [IU] via INTRAVENOUS
  Filled 2014-04-05: qty 1

## 2014-04-05 NOTE — ED Notes (Signed)
Dr. Zavitz at the bedside.  

## 2014-04-05 NOTE — Care Management Note (Signed)
    Page 1 of 1   04/05/2014     8:14:09 AM CARE MANAGEMENT NOTE 04/05/2014  Patient:  Albert Grant, MCCLAVE   Account Number:  [id number]  Date Initiated:  04/05/2014  Documentation initiated by:  Junius Creamer  Subjective/Objective Assessment:   adm w hyperkalemia,esrd     Action/Plan:   has wife, was dc to snf recently   Anticipated DC Date:     Anticipated DC Plan:  SKILLED NURSING FACILITY  In-house referral  Clinical Social Worker      DC Associate Professor  CM consult      Choice offered to / List presented to:             Status of service:  Completed, signed off Medicare Important Message given?   (If response is "NO", the following Medicare IM given date fields will be blank) Date Medicare IM given:   Date Additional Medicare IM given:    Discharge Disposition:    Per UR Regulation:  Reviewed for med. necessity/level of care/duration of stay  If discussed at Long Length of Stay Meetings, dates discussed:    Comments:

## 2014-04-05 NOTE — ED Notes (Signed)
Called pharmacy for calcuim chloride stat, and contacted lab about I-stat lab results.

## 2014-04-05 NOTE — Progress Notes (Signed)
Pt transported on vent from ED Room A10 to 2M11 with no complications.

## 2014-04-05 NOTE — ED Notes (Signed)
Both the 20 and 18 gauge IV's placed by Dr Jodi Mourning blew during medication administration.  Md made aware emergency central line placement setup

## 2014-04-05 NOTE — ED Provider Notes (Signed)
CSN: [id number]     Arrival date & time 04/05/14  0128 History   First MD Initiated Contact with Patient 04/05/14 0138     Chief Complaint  Patient presents with  . Altered Mental Status     (Consider location/radiation/quality/duration/timing/severity/associated sxs/prior Treatment) HPI Comments: 73 year old male with history of cardiomyopathy, diabetes, CAD, end-stage renal disease on dialysis, high blood pressure, [place] language presents from nursing home for worsening weakness and vomiting for unknown period of time. Patient reportedly missed dialysis recently. Patient was admitted recently for hyperkalemia and missing dialysis. Unable to obtain details from the patient due to language and encephalopathy. No known head injuries or recent bleeding. No known fevers at the nursing home.  Patient is a 73 y.o. male presenting with altered mental status. The history is provided by medical records, the EMS personnel and the nursing home.  Altered Mental Status   Past Medical History  Diagnosis Date  . 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 Hypertension   . CAD (coronary artery disease)      with angioplasty  . 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 Cardiomyopathy     with ejection fraction of 45 %  . Heart failure, systolic and diastolic   . Anemia   . CHF (congestive heart failure)    Past Surgical History  Procedure Laterality Date  . Spine surgery      spinal fusion  . Av fistula placement Left 01/16/2014    Procedure: ARTERIOVENOUS (AV) FISTULA CREATION; ULTRASOUND GUIDED;  Surgeon: Larina Earthlyodd F Early, MD;  [facility];  Service: Vascular;  Laterality: Left;   History reviewed. No pertinent family history. History  Substance Use Topics  . Smoking status: Never Smoker   . Smokeless tobacco: Never Used  . Alcohol Use: No    Review of Systems  Unable to perform ROS: Mental status change      Allergies  Lisinopril  Home Medications    Prior to Admission medications   Medication Sig Start Date End Date Taking? Authorizing Provider  AMBULATORY NON FORMULARY MEDICATION Lorazepam 1mg /ml Gel Sig: Apply 1ml topically twice daily as needed for anxiety 03/21/14  Yes Kimber RelicArthur G Green, MD  Amino Acids-Protein Hydrolys (FEEDING SUPPLEMENT, PRO-STAT SUGAR FREE 64,) LIQD Take 30 mLs by mouth 2 (two) times daily. 10am and 10pm   Yes Historical Provider, MD  aspirin 81 MG tablet Take 81 mg by mouth daily.   Yes Historical Provider, MD  atorvastatin (LIPITOR) 80 MG tablet Take 80 mg by mouth at bedtime. For hypercholesterolemia   Yes Historical Provider, MD  B Complex-C (SUPER B COMPLEX) TABS Take 1 tablet by mouth daily.    Yes Historical Provider, MD  calcium acetate (PHOSLO) 667 MG capsule Take 667-2,001 mg by mouth 4 (four) times daily - after meals and at bedtime. Take 3 capsules (2001 mg) 3 times daily with meals, take 1 capsule (667 mg) every night at bedtime with snack   Yes Historical Provider, MD  carvedilol (COREG) 25 MG tablet Take 25 mg by mouth 2 (two) times daily with a meal. For HTN (10am & 10pm)   Yes Historical Provider, MD  cloNIDine (CATAPRES - DOSED IN MG/24 HR) 0.3 mg/24hr patch Place 0.2 mg onto the skin once a week.   Yes Historical Provider, MD  clopidogrel (PLAVIX) 75 MG tablet Take 75 mg by mouth daily.    Yes Historical Provider, MD  ferrous sulfate 325 (65  FE) MG tablet Take 325 mg by mouth 3 (three) times daily with meals. 10am, 2pm, 10pm   Yes Historical Provider, MD  guaifenesin (ROBITUSSIN) 100 MG/5ML syrup Take 200 mg by mouth every 4 (four) hours as needed for cough.   Yes Historical Provider, MD  hydrALAZINE (APRESOLINE) 50 MG tablet Take 75 mg by mouth 3 (three) times daily. For HTN (10am, 2pm, 10pm)   Yes Historical Provider, MD  hydrOXYzine (ATARAX/VISTARIL) 25 MG tablet Take 25 mg by mouth every 4 (four) hours as needed for anxiety.    Yes Historical Provider, MD  insulin aspart (NOVOLOG) 100 UNIT/ML  injection Inject 0-9 Units into the skin 3 (three) times daily with meals. 03/20/14  Yes [name], MD  insulin aspart (NOVOLOG) 100 UNIT/ML injection Inject 6 Units into the skin at bedtime.   Yes Historical Provider, MD  insulin aspart (NOVOLOG) 100 UNIT/ML injection Inject 2 Units into the skin 3 (three) times daily before meals.   Yes Historical Provider, MD  insulin glargine (LANTUS) 100 UNIT/ML injection Inject 12 Units into the skin at bedtime.   Yes Historical Provider, MD  isosorbide mononitrate (IMDUR) 60 MG 24 hr tablet Take 60 mg by mouth daily. For HTN   Yes Historical Provider, MD  mirtazapine (REMERON) 15 MG tablet Take 15 mg by mouth at bedtime.   Yes Historical Provider, MD  nitroGLYCERIN (NITROSTAT) 0.4 MG SL tablet Place 0.4 mg under the tongue every 5 (five) minutes as needed for chest pain (May repeat for 3 doses as needed for chest pain. If chest pain persist call the MD.).   Yes Historical Provider, MD  ondansetron (ZOFRAN) 4 MG tablet Take 4 mg by mouth every 8 (eight) hours as needed for nausea or vomiting.   Yes Historical Provider, MD  oxyCODONE-acetaminophen (PERCOCET/ROXICET) 5-325 MG per tablet Take one tablet by mouth every 6 hours as needed for severe pain 03/21/14  Yes Mahima Pandey, MD  pantoprazole (PROTONIX) 40 MG tablet Take 40 mg by mouth 2 (two) times daily. For esophageal reflux (6:30am & 4:30pm)   Yes Historical Provider, MD  PRESCRIPTION MEDICATION Take 1 packet by mouth daily. Novosource (supplement)   Yes Historical Provider, MD  promethazine (PHENERGAN) 25 MG tablet Take 25 mg by mouth once.   Yes Historical Provider, MD  sertraline (ZOLOFT) 100 MG tablet Take 100 mg by mouth daily. For mood   Yes Historical Provider, MD   BP 147/60  Pulse 59  Temp(Src) 97.6 F (36.4 C) (Oral)  Resp 20  Ht 5\' 5"  (1.651 m)  Wt 144 lb 2.9 oz (65.4 kg)  BMI 23.99 kg/m2  SpO2 100% Physical Exam  Nursing note and vitals reviewed. Constitutional: He appears  well-developed. No distress.  HENT:  Head: Normocephalic and atraumatic.  Dry mucous membranes  Neck: Normal range of motion. Neck supple. No tracheal deviation present.  Cardiovascular: An irregularly irregular rhythm present. Bradycardia present.   Pulses:      Radial pulses are 2+ on the right side.       Femoral pulses are 2+ on the right side, and 2+ on the left side. Pulmonary/Chest: No respiratory distress. He has no wheezes. He has rales (bases bilateral).  Abdominal: Soft. He exhibits no distension. There is no tenderness.  Neurological: GCS eye subscore is 2. GCS verbal subscore is 3. GCS motor subscore is 5.  General confusion, difficult exam due to poor [place] language. Patient moves extremities with equal strength upper and lower tremors bilateral. Gross sensation  of pain intact. Patient lethargic on exam, neck supple pupils equal bilateral  Skin: Skin is warm.  Pale  Psychiatric:  Encephalopathic    ED Course  CENTRAL LINE Date/Time: 04/05/2014 7:48 AM Performed by: Enid Skeens Authorized by: Enid Skeens Consent: The procedure was performed in an emergent situation. Patient identity confirmed: arm band Indications: vascular access Local anesthetic: lidocaine 1% without epinephrine Patient sedated: no Preparation: skin prepped with 2% chlorhexidine and skin prepped with Betadine Location details: right femoral Patient position: flat Catheter size: 7 Fr Ultrasound guidance: yes Number of attempts: 1 Successful placement: yes Post-procedure: line sutured and dressing applied Assessment: blood return through all ports   (including critical care time) Emergency Ultrasound Study:   Angiocath insertion Performed by: Enid Skeens  Consent: Verbal consent obtained. Risks and benefits: risks, benefits and alternatives were discussed Immediately prior to procedure the correct patient, procedure, equipment, support staff and site/side marked as  needed.  Indication: difficult IV access Preparation: Patient was prepped and draped in the usual sterile fashion. Vein Location: rightAC vein was visualized during assessment for potential access sites and was found to be patent/ easily compressed with linear ultrasound.  The needle was visualized with real-time ultrasound and guided into the vein. Gauge: 18 g  Image saved and stored.  Normal blood return.  Patient tolerance: Patient tolerated the procedure well with no immediate complications.  Emergency Ultrasound Study:   Angiocath insertion Performed by: Enid Skeens  Consent: Verbal consent obtained. Risks and benefits: risks, benefits and alternatives were discussed Immediately prior to procedure the correct patient, procedure, equipment, support staff and site/side marked as needed.  Indication: difficult IV access Preparation: Patient was prepped and draped in the usual sterile fashion. Vein Location: right femoral vein was visualized during assessment for potential access sites and was found to be patent/ easily compressed with linear ultrasound.  The needle was visualized with real-time ultrasound and guided into the vein. Gauge: 7 french  Image saved Normal blood return.  Patient tolerance: Patient tolerated the procedure well with no immediate complications.      CRITICAL CARE Performed by: Enid Skeens   Total critical care time: 90 min  Critical care time was exclusive of separately billable procedures and treating other patients.  Critical care was necessary to treat or prevent imminent or life-threatening deterioration.  Critical care was time spent personally by me on the following activities: development of treatment plan with patient and/or surrogate as well as nursing, discussions with consultants, evaluation of patient's response to treatment, examination of patient, obtaining history from patient or surrogate, ordering and performing treatments  and interventions, ordering and review of laboratory studies, ordering and review of radiographic studies, pulse oximetry and re-evaluation of patient's condition.  INTUBATION Performed by: Enid Skeens  Required items: required blood products, implants, devices, and special equipment available Patient identity confirmed: provided demographic data and hospital-assigned identification number Time out: Immediately prior to procedure a "time out" was called to verify the correct patient, procedure, equipment, support staff.  Indications: altered, airway protection Intubation method: direct Preoxygenation: BVM Sedatives:Etomidate Paralytic: Rocuronium Tube Size: 7.5 cuffed  Post-procedure assessment: chest rise and ETCO2 monitor Breath sounds: equal and absent over the epigastrium Tube secured with: ETT holder Chest x-ray interpreted by me.  Chest x-ray findings: endotracheal tube in appropriate position  Patient tolerated the procedure well with no immediate complications.  I personally reviewed and interpreted the EKG and CXR.  Labs Review Labs Reviewed  MRSA PCR SCREENING - Abnormal; Notable for the following:    MRSA by PCR POSITIVE (*)    All other components within normal limits  COMPREHENSIVE METABOLIC PANEL - Abnormal; Notable for the following:    Sodium 136 (*)    Potassium >7.7 (*)    Chloride 93 (*)    CO2 14 (*)    Glucose, Bld 215 (*)    BUN 154 (*)    Creatinine, Ser 12.77 (*)    Albumin 3.1 (*)    AST 452 (*)    ALT 458 (*)    Alkaline Phosphatase 257 (*)    GFR calc non Af Amer 3 (*)    GFR calc Af Amer 4 (*)    All other components within normal limits  CBC WITH DIFFERENTIAL - Abnormal; Notable for the following:    WBC 3.7 (*)    RBC 2.73 (*)    Hemoglobin 8.9 (*)    HCT 28.2 (*)    MCV 103.3 (*)    Neutrophils Relative % 90 (*)    Lymphocytes Relative 7 (*)    Lymphs Abs 0.3 (*)    All other components within normal limits  URINALYSIS,  ROUTINE W REFLEX MICROSCOPIC - Abnormal; Notable for the following:    APPearance TURBID (*)    Glucose, UA 250 (*)    Hgb urine dipstick MODERATE (*)    Protein, ur >300 (*)    All other components within normal limits  BASIC METABOLIC PANEL - Abnormal; Notable for the following:    Potassium 7.5 (*)    Chloride 95 (*)    CO2 15 (*)    Glucose, Bld 256 (*)    BUN 157 (*)    Creatinine, Ser 12.94 (*)    GFR calc non Af Amer 3 (*)    GFR calc Af Amer 4 (*)    All other components within normal limits  MAGNESIUM - Abnormal; Notable for the following:    Magnesium 4.4 (*)    All other components within normal limits  PHOSPHORUS - Abnormal; Notable for the following:    Phosphorus 15.5 (*)    All other components within normal limits  CBC - Abnormal; Notable for the following:    RBC 2.52 (*)    Hemoglobin 8.4 (*)    HCT 25.4 (*)    MCV 100.8 (*)    All other components within normal limits  GLUCOSE, CAPILLARY - Abnormal; Notable for the following:    Glucose-Capillary 151 (*)    All other components within normal limits  I-STAT TROPOININ, ED - Abnormal; Notable for the following:    Troponin i, poc 0.13 (*)    All other components within normal limits  I-STAT CHEM 8, ED - Abnormal; Notable for the following:    Sodium 134 (*)    Potassium 8.0 (*)    BUN >140 (*)    Creatinine, Ser 13.60 (*)    Glucose, Bld 208 (*)    Hemoglobin 9.9 (*)    HCT 29.0 (*)    All other components within normal limits  CBG MONITORING, ED - Abnormal; Notable for the following:    Glucose-Capillary 193 (*)    All other components within normal limits  CBG MONITORING, ED - Abnormal; Notable for the following:    Glucose-Capillary 249 (*)    All other components within normal limits  URINE CULTURE  CULTURE, BLOOD (ROUTINE X 2)  CULTURE, BLOOD (ROUTINE X 2)  URINE CULTURE  AMMONIA  URINE MICROSCOPIC-ADD ON  TROPONIN I  LACTIC ACID, PLASMA  BASIC METABOLIC PANEL  BASIC METABOLIC PANEL   BASIC METABOLIC PANEL  BASIC METABOLIC PANEL  BASIC METABOLIC PANEL  TROPONIN I  TROPONIN I  BLOOD GAS, ARTERIAL  PROCALCITONIN  I-STAT CG4 LACTIC ACID, ED   he is  Imaging Review Dg Chest Port 1 View  04/05/2014   CLINICAL DATA:  Endotracheal tube positioning.  EXAM: PORTABLE CHEST - 1 VIEW  COMPARISON:  Chest radiograph performed earlier today at 1:52 a.m.  FINDINGS: The patient's endotracheal tube is seen ending 2-3 cm above the carina. An enteric tube is noted extending below the diaphragm.  There is mild elevation of the right hemidiaphragm. Vascular congestion is noted. Mild bilateral atelectasis is seen. No pleural effusion or pneumothorax is identified, though the left costophrenic angle is incompletely imaged on this study.  The cardiomediastinal silhouette is mildly enlarged. No acute osseous abnormalities are identified. External pacing pads are seen.  IMPRESSION: 1. Endotracheal tube seen ending 2 3 cm above the carina. 2. Mild elevation of the right hemidiaphragm. Vascular congestion and mild cardiomegaly noted. Mild bilateral atelectasis seen.   Electronically Signed   By: Roanna Raider M.D.   On: 04/05/2014 04:48   Dg Chest Port 1 View  04/05/2014   CLINICAL DATA:  Crackles, shortness of breath, history cardiomyopathy, CHF, diabetes, hypertension, coronary artery disease, end-stage renal disease  EXAM: PORTABLE CHEST - 1 VIEW  COMPARISON:  Portable exam 0152 hr compared to 03/14/2014  FINDINGS: Enlargement of cardiac silhouette with pulmonary vascular congestion.  Atherosclerotic calcification aorta.  Mediastinal contours otherwise normal.  Minimal RIGHT base atelectasis.  No acute infiltrate, pleural effusion or pneumothorax.  Coronary arterial stent noted projecting over LEFT heart.  Bones unremarkable.  IMPRESSION: Enlargement of cardiac silhouette with pulmonary vascular congestion.  Minimal RIGHT base atelectasis.   Electronically Signed   By: Ulyses Southward M.D.   On: 04/05/2014  02:32     EKG Interpretation None     EKG reviewed showing heart rate 29, social rhythm, QRS prolonged 141, QT 685, peaked T waves, normal axis MDM   Final diagnoses:  Bradycardia  Prolonged QT interval  Altered mental status  Vomiting  End stage renal disease   hyperkalemia with EKG changes and abnormal EKG Anemia Pulmonary edema   Patient presented with vomiting and encephalopathy with reported missing dialysis recently. Initial plan for infectious, metabolic/renal, electrolytes workup. During initial workup the patient continued to worsen and became more confused and had vomiting along with change in heart rhythm and rate becoming bradycardic. Patient was able to maintain blood pressure. Potassium pending the concern for hyperkalemia and with prolonged QT and wide QRS on EKG calcium, magnesium, insulin/glucose, continuous albuterol ordered. I-STAT potassium confirms assessment 8. Patient's heart rate worsened down to the 20s. Difficult IV ultrasound guided in an IV stopped working.  Emergent central line placed in the right femoral vein and all 3 ports flushed easily. We repeated hyperkalemia protocol and nephrology was called emergently for stat dialysis. I discussed the case with critical care for admission.  Patient's mental status was worsening and intubated for airway protection and further treatment. Per report patient is full code. Prolonged time and resuscitation and multiple rechecks of patient.  Critical care and nephrology arrived and arrangements were made for emergent dialysis. Patient's heart rate did temporarily improve in ED with treatment.  The patients results and plan were reviewed and discussed.   Any x-rays performed were personally  reviewed by myself.   Differential diagnosis were considered with the presenting HPI.   Filed Vitals:   04/05/14 0630 04/05/14 0645 04/05/14 0700 04/05/14 0715  BP: 175/81 163/61 149/60 147/60  Pulse: 66 59 60 59   Temp:      TempSrc:      Resp: 17 17 19 20   Height:      Weight:      SpO2:        Admission/ observation were discussed with the admitting physician, patient and/or family and they are comfortable with the plan.       Enid Skeens, MD 04/05/14 [phone]

## 2014-04-05 NOTE — ED Notes (Signed)
X-ray called to complete X-ray

## 2014-04-05 NOTE — Progress Notes (Signed)
59mL IV Versed wasted in sink, verified by Juliette Alcide, Charity fundraiser

## 2014-04-05 NOTE — ED Notes (Signed)
Vent settings prior to transfer: PRVC, PEEP 5, RR 24, TV: 490, O2 at 100% with ETT.

## 2014-04-05 NOTE — Procedures (Signed)
I was present at this session.  I have reviewed the session itself and made appropriate changes.  HD via LUA fistula. Needles within 1 1/2 in,both  Up so some recirculation.  Edema.  Albert Grant Crooke 5/27/20157:23 AM

## 2014-04-05 NOTE — ED Notes (Signed)
Per EMS, patient is [place] will respond "yes and no" with history of dementia.  End Stage Renal Failure.  Unlike himself, unable to hold himself up, vomited twice, per nurse at facility at [facility]. BP: 140/50, P 52, CBG 191, O2 96% on room air. Missed dialysis today because "he was weak and didn't feel good".

## 2014-04-05 NOTE — Progress Notes (Signed)
Subjective: Interval History: sedated on vent..  Objective: Vital signs in last 24 hours: Temp:  [97.6 F (36.4 C)-98.3 F (36.8 C)] 97.6 F (36.4 C) (05/27 0515) Pulse Rate:  [25-66] 59 (05/27 0715) Resp:  [9-24] 20 (05/27 0715) BP: (130-190)/(49-98) 147/60 mmHg (05/27 0715) SpO2:  [99 %-100 %] 100 % (05/27 0530) FiO2 (%):  [100 %] 100 % (05/27 0447) Weight:  [64.7 kg (142 lb 10.2 oz)-65.4 kg (144 lb 2.9 oz)] 65.4 kg (144 lb 2.9 oz) (05/27 0515) Weight change:   Intake/Output from previous day: 05/26 0701 - 05/27 0700 In: 56 [I.V.:6; IV Piggyback:50] Out: 100 [Urine:100] Intake/Output this shift:    General appearance: pale and sedated, not responding to verbal stim Resp: rhonchi bilaterally Cardio: S1, S2 normal and systolic murmur: holosystolic 2/6, blowing at apex GI: pos bs, liver down 4 cm Extremities: edema 2+, AVF LUA  Lab Results:  Recent Labs  04/05/14 0206 04/05/14 0229 04/05/14 0500  WBC 3.7*  --  4.2  HGB 8.9* 9.9* 8.4*  HCT 28.2* 29.0* 25.4*  PLT 178  --  187   BMET:  Recent Labs  04/05/14 0206 04/05/14 0229 04/05/14 0344  NA 136* 134* 137  K >7.7* 8.0* 7.5*  CL 93* 101 95*  CO2 14*  --  15*  GLUCOSE 215* 208* 256*  BUN 154* >140* 157*  CREATININE 12.77* 13.60* 12.94*  CALCIUM 9.3  --  10.3   No results found for this basename: PTH,  in the last 72 hours Iron Studies: No results found for this basename: IRON, TIBC, TRANSFERRIN, FERRITIN,  in the last 72 hours  Studies/Results: Dg Chest Port 1 View  04/05/2014   CLINICAL DATA:  Endotracheal tube positioning.  EXAM: PORTABLE CHEST - 1 VIEW  COMPARISON:  Chest radiograph performed earlier today at 1:52 a.m.  FINDINGS: The patient's endotracheal tube is seen ending 2-3 cm above the carina. An enteric tube is noted extending below the diaphragm.  There is mild elevation of the right hemidiaphragm. Vascular congestion is noted. Mild bilateral atelectasis is seen. No pleural effusion or  pneumothorax is identified, though the left costophrenic angle is incompletely imaged on this study.  The cardiomediastinal silhouette is mildly enlarged. No acute osseous abnormalities are identified. External pacing pads are seen.  IMPRESSION: 1. Endotracheal tube seen ending 2 3 cm above the carina. 2. Mild elevation of the right hemidiaphragm. Vascular congestion and mild cardiomegaly noted. Mild bilateral atelectasis seen.   Electronically Signed   By: Roanna Raider M.D.   On: 04/05/2014 04:48   Dg Chest Port 1 View  04/05/2014   CLINICAL DATA:  Crackles, shortness of breath, history cardiomyopathy, CHF, diabetes, hypertension, coronary artery disease, end-stage renal disease  EXAM: PORTABLE CHEST - 1 VIEW  COMPARISON:  Portable exam 0152 hr compared to 03/14/2014  FINDINGS: Enlargement of cardiac silhouette with pulmonary vascular congestion.  Atherosclerotic calcification aorta.  Mediastinal contours otherwise normal.  Minimal RIGHT base atelectasis.  No acute infiltrate, pleural effusion or pneumothorax.  Coronary arterial stent noted projecting over LEFT heart.  Bones unremarkable.  IMPRESSION: Enlargement of cardiac silhouette with pulmonary vascular congestion.  Minimal RIGHT base atelectasis.   Electronically Signed   By: Ulyses Southward M.D.   On: 04/05/2014 02:32    I have reviewed the patient's current medications.  Assessment/Plan: 1 CRF noncompliance, 2nd event in mon.  ? If wants to cont dialysis.  Follow K 2 HTN needs lower vol 3 Dementia 4 Anemia will use epo 5  HPTH no values 6 DM controlled 7 CAD  P HD, epo, Needs Palliative Care Consult   LOS: 0 days   Marge Vandermeulen L Raynald Rouillard 04/05/2014,7:25 AM

## 2014-04-05 NOTE — ED Notes (Signed)
The patient is unable to give urine specimen at this time. The RN is aware.

## 2014-04-05 NOTE — H&P (Signed)
PULMONARY / CRITICAL CARE MEDICINE   Name: Albert Grant MRN: [id number] DOB: Apr 01, 1941    ADMISSION DATE:  04/05/2014 CONSULTATION DATE:  04/05/2014  REFERRING MD :  EDP PRIMARY SERVICE: PCCM  CHIEF COMPLAINT:  Hyperkalemia  BRIEF PATIENT DESCRIPTION: 73 year old [place] male, ESRD on HD, Advanced dementia. Missed dialysis 5/26. AMS and vomiting at [facility] 5/26, brought to [facility]. Found to be hyperkalemic in need of urgent dialysis. PCCM to admit.   SIGNIFICANT EVENTS / STUDIES:  3/9 - LUE AV fistula placed.  5/27 - admitted for emergent HD  LINES / TUBES: OETT 5/27 >>> R fem CVL 5/27 >>>  CULTURES: None  ANTIBIOTICS: Zosyn 5/27 >>>  HISTORY OF PRESENT ILLNESS:  73 year old [place] male (limited english) with PMH as below, which includes ESRD on HD, DM, CAD, and CHF (LVEF reportedly 45%). Resides in SNF due to severe dementia. Two recent hospitalizations including one at [facility] for pseudomonas bacteremia, and most recently at [facility] for missing several dialysis treatments, discharged 03/20/2014. He presents to ED 5/27 after missing dialysis at least 5/26. At [facility] senior care) he had an acute change in mental status and several episodes of vomiting. He was brought to [facility] ED and was found to be profoundly hyperkalemic (8) with bradycardia.. Was given insulin, dextrose, and calcium chloride in ED. Likely need for emergent HD, intubated in ED for airway protection. PCCM asked to admit to ICU.   PAST MEDICAL HISTORY :  Past Medical History  Diagnosis Date  . 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 Hypertension   . CAD (coronary artery disease)      with angioplasty  . 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 Cardiomyopathy     with ejection fraction of 45 %  . Heart failure, systolic and diastolic   . Anemia   . CHF (congestive heart failure)    Past Surgical History  Procedure Laterality Date  . Spine surgery      spinal fusion  . Av fistula  placement Left 01/16/2014    Procedure: ARTERIOVENOUS (AV) FISTULA CREATION; ULTRASOUND GUIDED;  Surgeon: Larina Earthly, MD;  Location: [facility] OR;  Service: Vascular;  Laterality: Left;   Prior to Admission medications   Medication Sig Start Date End Date Taking? Authorizing Provider  AMBULATORY NON FORMULARY MEDICATION Lorazepam 1mg /ml Gel Sig: Apply 1ml topically twice daily as needed for anxiety 03/21/14   Kimber Relic, MD  Amino Acids-Protein Hydrolys (FEEDING SUPPLEMENT, PRO-STAT SUGAR FREE 64,) LIQD Take 30 mLs by mouth 2 (two) times daily. 10am and 10pm    Historical Provider, MD  aspirin 81 MG tablet Take 81 mg by mouth daily.    Historical Provider, MD  atorvastatin (LIPITOR) 80 MG tablet Take 80 mg by mouth at bedtime. For hypercholesterolemia    Historical Provider, MD  B Complex-C (SUPER B COMPLEX) TABS Take 1 tablet by mouth daily.     Historical Provider, MD  calcium acetate (PHOSLO) 667 MG capsule Take 667-2,001 mg by mouth 4 (four) times daily - after meals and at bedtime. Take 3 capsules (2001 mg) 3 times daily with meals, take 1 capsule (667 mg) every night at bedtime with snack    Historical Provider, MD  carvedilol (COREG) 25 MG tablet Take 25 mg by mouth 2 (two) times daily with a meal. For HTN (10am & 10pm)    Historical Provider, MD  cloNIDine (CATAPRES - DOSED  IN MG/24 HR) 0.3 mg/24hr patch Place 0.2 mg onto the skin once a week.    Historical Provider, MD  clopidogrel (PLAVIX) 75 MG tablet Take 75 mg by mouth daily.     Historical Provider, MD  ferrous sulfate 325 (65 FE) MG tablet Take 325 mg by mouth 3 (three) times daily with meals. 10am, 2pm, 10pm    Historical Provider, MD  guaifenesin (ROBITUSSIN) 100 MG/5ML syrup Take 200 mg by mouth every 4 (four) hours as needed for cough.    Historical Provider, MD  hydrALAZINE (APRESOLINE) 50 MG tablet Take 75 mg by mouth 3 (three) times daily. For HTN (10am, 2pm, 10pm)    Historical Provider, MD  hydrOXYzine (ATARAX/VISTARIL) 25 MG  tablet Take 25 mg by mouth every 4 (four) hours as needed for anxiety.     Historical Provider, MD  insulin aspart (NOVOLOG) 100 UNIT/ML injection Inject 0-5 Units into the skin at bedtime. 03/20/14   [name], MD  insulin aspart (NOVOLOG) 100 UNIT/ML injection Inject 0-9 Units into the skin 3 (three) times daily with meals. 03/20/14   [name], MD  insulin glargine (LANTUS) 100 UNIT/ML injection Inject 0.06 mLs (6 Units total) into the skin at bedtime. 03/20/14   [name], MD  isosorbide mononitrate (IMDUR) 60 MG 24 hr tablet Take 60 mg by mouth daily. For HTN    Historical Provider, MD  mirtazapine (REMERON) 7.5 MG tablet Take 1 tablet (7.5 mg total) by mouth at bedtime. 03/20/14   [name], MD  nitroGLYCERIN (NITROSTAT) 0.4 MG SL tablet Place 0.4 mg under the tongue every 5 (five) minutes as needed for chest pain (May repeat for 3 doses as needed for chest pain. If chest pain persist call the MD.).    Historical Provider, MD  ondansetron (ZOFRAN) 4 MG tablet Take 4 mg by mouth every 8 (eight) hours as needed for nausea or vomiting.    Historical Provider, MD  oxyCODONE-acetaminophen (PERCOCET/ROXICET) 5-325 MG per tablet Take one tablet by mouth every 6 hours as needed for severe pain 03/21/14   Oneal Grout, MD  pantoprazole (PROTONIX) 40 MG tablet Take 40 mg by mouth 2 (two) times daily. For esophageal reflux (6:30am & 4:30pm)    Historical Provider, MD  PRESCRIPTION MEDICATION Take 1 packet by mouth daily. Novosource (supplement)    Historical Provider, MD  PRESCRIPTION MEDICATION Apply 1 mg topically 2 (two) times daily as needed (anxiety/agitation). Lorazepam 1 mg/ml gel    Historical Provider, MD  sertraline (ZOLOFT) 100 MG tablet Take 100 mg by mouth daily. For mood    Historical Provider, MD   Allergies  Allergen Reactions  . Lisinopril Other (See Comments)    Per MAR    FAMILY HISTORY:  No family history on file. SOCIAL HISTORY:  reports that he has never smoked. He has  never used smokeless tobacco. He reports that he does not drink alcohol or use illicit drugs.  REVIEW OF SYSTEMS:  Unable as pt is encephalopathic  SUBJECTIVE:   VITAL SIGNS: Temp:  [98.3 F (36.8 C)] 98.3 F (36.8 C) (05/27 0213) Pulse Rate:  [25-33] 25 (05/27 0215) Resp:  [12-24] 12 (05/27 0215) BP: (130)/(49) 130/49 mmHg (05/27 0215) SpO2:  [99 %-100 %] 99 % (05/27 0215) HEMODYNAMICS:   VENTILATOR SETTINGS:   INTAKE / OUTPUT: Intake/Output     05/26 0701 - 05/27 0700   Urine 75   Total Output 75   Net -75  PHYSICAL EXAMINATION: General:  Intubated, lethargic elderly male Neuro:  Lethargic, follows commands bilaterally HEENT:  [place]/AT, no JVD noted Cardiovascular:  Huston FoleyBrady, regular Lungs:  Scattered rhonchi Abdomen:  Soft, non-distended Musculoskeletal:  No acute deformity, moves all extremities  Skin:  Intact  LABS:  CBC  Recent Labs Lab 04/05/14 0206 04/05/14 0229  WBC 3.7*  --   HGB 8.9* 9.9*  HCT 28.2* 29.0*  PLT 178  --    Coag's No results found for this basename: APTT, INR,  in the last 168 hours BMET  Recent Labs Lab 04/05/14 0229  NA 134*  K 8.0*  CL 101  BUN >140*  CREATININE 13.60*  GLUCOSE 208*   Electrolytes No results found for this basename: CALCIUM, MG, PHOS,  in the last 168 hours Sepsis Markers  Recent Labs Lab 04/05/14 0215  LATICACIDVEN 1.67   ABG No results found for this basename: PHART, PCO2ART, PO2ART,  in the last 168 hours Liver Enzymes No results found for this basename: AST, ALT, ALKPHOS, BILITOT, ALBUMIN,  in the last 168 hours Cardiac Enzymes No results found for this basename: TROPONINI, PROBNP,  in the last 168 hours Glucose  Recent Labs Lab 04/05/14 0238  GLUCAP 193*    Imaging Dg Chest Port 1 View  04/05/2014   CLINICAL DATA:  Crackles, shortness of breath, history cardiomyopathy, CHF, diabetes, hypertension, coronary artery disease, end-stage renal disease  EXAM: PORTABLE CHEST - 1 VIEW   COMPARISON:  Portable exam 0152 hr compared to 03/14/2014  FINDINGS: Enlargement of cardiac silhouette with pulmonary vascular congestion.  Atherosclerotic calcification aorta.  Mediastinal contours otherwise normal.  Minimal RIGHT base atelectasis.  No acute infiltrate, pleural effusion or pneumothorax.  Coronary arterial stent noted projecting over LEFT heart.  Bones unremarkable.  IMPRESSION: Enlargement of cardiac silhouette with pulmonary vascular congestion.  Minimal RIGHT base atelectasis.   Electronically Signed   By: Ulyses SouthwardMark  Boles M.D.   On: 04/05/2014 02:32    ASSESSMENT / PLAN:  PULMONARY A: Acute Respiratory Failure intubated for airway protection in setting of AMS and vomiting Likely Aspiration PNA  P:   Vent support, STAT abg to ensure corrected PH in setting hyperkalemia Avoid acidosis, may need high MV Follow CXR, ABG in am  VAP prevention per protocol STAT HD, for volume off Will need to discuss DNI status pre extubation  CARDIOVASCULAR A:  Bradycardia -  In setting of hyperkalemia, atropine x 1 in ED Elevated troponin in setting of ESRD, possibly demand ischemia from bradycardia.  H/o HTN, CHF does not appear volume overloaded  P:  Cardiac monitoring Atropine as needed for HR < 35 Trend troponin Check lactic acid Hold home lipitor, coreg, clonidine, plavix, hydralazine, imdur for now Pacer pads on chest Stat correction K MAp goal 55-60  RENAL A:   ESRD on HD Hyperkalemia (Insulin, calcium chloride given x2 in ED)  P:   Nephrology following Emergent HD, if delayed then bicarb drip Kayexalate 60gm PR now Insulin with D5, calcium chloride as needed for hyper K.  Hold home Phoslo for now  GASTROINTESTINAL A:   GERD Elevated LFT, r/o congestive hepatopathy P:   Protonix BID (home dose) NPO Follow LFT Supportive care May need acute hep panel  HEMATOLOGIC A:   Anemia of chronic illness  P:  Follow CBC dvt prevention, consider sub q hep Avoid  lovenox  INFECTIOUS A:   Probable Aspiration PNA Leukopenia with left shift Recent pseudomonas bacteremia (not sure how recent)  P:   ABX  and cultures as above PCt to limit abx,  empiri czosyn for now, want to dc in 72 hr if good clinical stats and pct low, in ESRD may be flat  sputum  ENDOCRINE A:   DM  P:   CBG and SSI  NEUROLOGIC A:   Acute on chronic encephalopathy Dementia appears severe at baseline Depression  P:   Monitor Hold home zoloft, remeron, for now. WUA post HD fent  GLOBAL: Full code from SNF. Was seen by palliative care last admission but they were unable to communicate effectively with family, who missed their meeting with palliative care. Needs a GOC discussion in near future. Emergent HD  Joneen Roach, ACNP [facility] Pager [phone] or [phone]   I have personally obtained a history, examined the patient, evaluated laboratory and imaging results, formulated the assessment and plan and placed orders. CRITICAL CARE: The patient is critically ill with multiple organ systems failure and requires high complexity decision making for assessment and support, frequent evaluation and titration of therapies, application of advanced monitoring technologies and extensive interpretation of multiple databases. Critical Care Time devoted to patient care services described in this note is 35 minutes.   Mcarthur Rossetti. Tyson Alias, MD, FACP Pgr: [phone] [facility]

## 2014-04-05 NOTE — Progress Notes (Signed)
Thank you for consulting the Palliative Medicine Team at [facility] to meet your patient's and family's needs.   The reason that you asked [place] to see your patient is  For Clarification of GOC and options  We have scheduled your patient for a meeting: Tomorrow 04-06-14 at 1000 am with Lorinda Creed NP  The Surrogate decision make is: son, Julaine Hua NP  Palliative Medicine Team Team Phone # [phone] Pager [phone]

## 2014-04-05 NOTE — ED Notes (Signed)
Called pharmacy for sedation ASAP.

## 2014-04-05 NOTE — ED Notes (Signed)
Pt. Intubated at 0321 by Dr. Jodi Mourning, cords visualized, color change, lungs sounds auscultated

## 2014-04-05 NOTE — Progress Notes (Signed)
ANTIBIOTIC CONSULT NOTE - INITIAL  Pharmacy Consult for Zosyn Indication: Aspiration PNA  Allergies  Allergen Reactions  . Lisinopril Other (See Comments)    [facility]   Patient Measurements: Height: 5\' 5"  (165.1 cm) IBW/kg (Calculated) : 61.5 Weight:  67.1 kg  Vital Signs: Temp: 98.3 F (36.8 C) (05/27 0213) Temp src: Oral (05/27 0213) BP: 179/63 mmHg (05/27 0348) Pulse Rate: 43 (05/27 0348) Intake/Output from previous day: 05/26 0701 - 05/27 0700 In: -  Out: 75 [Urine:75] Intake/Output from this shift: Total I/O In: -  Out: 75 [Urine:75]  Labs:  Recent Labs  04/05/14 0206 04/05/14 0229  WBC 3.7*  --   HGB 8.9* 9.9*  PLT 178  --   CREATININE 12.77* 13.60*    Microbiology: Recent Results (from the past 720 hour(s))  CULTURE, BLOOD (ROUTINE X 2)     Status: None   Collection Time    03/14/14  8:48 PM      Result Value Ref Range Status   Specimen Description BLOOD EJ   Final   Special Requests BOTTLES DRAWN AEROBIC AND ANAEROBIC 10CC EACH   Final   Culture  Setup Time     Final   Value: 03/15/2014 01:43     Performed at [facility]   Culture     Final   Value: NO GROWTH 5 DAYS     Performed at [facility]   Report Status 03/21/2014 FINAL   Final  CULTURE, BLOOD (ROUTINE X 2)     Status: None   Collection Time    03/14/14  8:53 PM      Result Value Ref Range Status   Specimen Description BLOOD ARM RIGHT   Final   Special Requests BOTTLES DRAWN AEROBIC AND ANAEROBIC 10CC   Final   Culture  Setup Time     Final   Value: 03/15/2014 01:43     Performed at [facility]   Culture     Final   Value: STAPHYLOCOCCUS SPECIES (COAGULASE NEGATIVE)     Note: THE SIGNIFICANCE OF ISOLATING THIS ORGANISM FROM A SINGLE SET OF BLOOD CULTURES WHEN MULTIPLE SETS ARE DRAWN IS UNCERTAIN. PLEASE NOTIFY THE MICROBIOLOGY DEPARTMENT WITHIN ONE WEEK IF SPECIATION AND SENSITIVITIES ARE REQUIRED.     Note: Gram Stain Report Called to,Read Back By and  Verified With: EMMANUEL CASTRO ON 03/15/2014 AT 8:20P BY Serafina MitchellWILEJ     Performed at [facility]   Report Status 03/18/2014 FINAL   Final  URINE CULTURE     Status: None   Collection Time    03/14/14  9:11 PM      Result Value Ref Range Status   Specimen Description URINE, CATHETERIZED   Final   Special Requests NONE   Final   Culture  Setup Time     Final   Value: 03/15/2014 04:38     Performed at [facility]   Colony Count     Final   Value: NO GROWTH     Performed at [facility]   Culture     Final   Value: NO GROWTH     Performed at [facility]   Report Status 03/16/2014 FINAL   Final  MRSA PCR SCREENING     Status: None   Collection Time    03/15/14  8:18 AM      Result Value Ref Range Status   MRSA by PCR NEGATIVE  NEGATIVE Final   Comment:  The GeneXpert MRSA Assay (FDA     approved for NASAL specimens     only), is one component of a     comprehensive MRSA colonization     surveillance program. It is not     intended to diagnose MRSA     infection nor to guide or     monitor treatment for     MRSA infections.   Medical History: Past Medical History  Diagnosis Date  . 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 Hypertension   . CAD (coronary artery disease)      with angioplasty  . 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 Cardiomyopathy     with ejection fraction of 45 %  . Heart failure, systolic and diastolic   . Anemia   . CHF (congestive heart failure)    Medications:  Anti-infectives   Start     Dose/Rate Route Frequency Ordered Stop   04/05/14 0600  piperacillin-tazobactam (ZOSYN) IVPB 2.25 g     2.25 g 100 mL/hr over 30 Minutes Intravenous 3 times per day 04/05/14 0446       Assessment: 73yo male with multiple medical problems including the possibility of aspiration PNA.  We have been asked to start him on some IV Zosyn for empiric coverage.  He has a history of ESRD and missed HD this  week.  He has a WBC of 3.7 and a lactic acid of 1.67.  Goal of Therapy:  Therapeutic response to IV antibiotics  Plan:  1.  Zosyn 2.25 gm IV every 8 hours 2.  F/U clinical response, culture data and LOT  Nadara Mustard, PharmD., MS Clinical Pharmacist Pager:  [phone] Thank you for allowing pharmacy to be part of this patients care team. 04/05/2014,4:47 AM

## 2014-04-05 NOTE — ED Notes (Signed)
Critical K level reported to Dr. Jodi Mourning 7.7

## 2014-04-05 NOTE — ED Notes (Signed)
Phlebotomy at the bedside attempting to draw blood cultures

## 2014-04-05 NOTE — Progress Notes (Signed)
CRITICAL VALUE ALERT  Critical value received:  Positive MRSA PCR   Date of notification:  04-05-14  Time of notification:  0630  Critical value read back:yes  Nurse who received alert:  Jamesetta So RN   Pt placed on orange isolation and order set

## 2014-04-05 NOTE — Consult Note (Addendum)
Albert HaberRene Horen 04/05/2014 Arita Missyan B Erika Slaby Requesting Physician:  Jodi MourningZavitz   Reason for Consult:  Hyperkalemia, Bradycardia, ESRD HPI:  [age]M ESRD THS LUE AVF presented to ED with N/V, bradycardia and with K of 8.0.  [facility].  Tonight rec medical therapy with persistent bradycardia.  Intubated.  Chart review lists dementia.  Per notes he missed his scheduled HD 5/26.     ROS Balance of 12 systems is negative w/ exceptions as above  Outpt HD Orders [facility] via AVF  PMH  Past Medical History  Diagnosis Date  . 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 Hypertension   . CAD (coronary artery disease)      with angioplasty  . 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 Cardiomyopathy     with ejection fraction of 45 %  . Heart failure, systolic and diastolic   . Anemia   . CHF (congestive heart failure)    PSH  Past Surgical History  Procedure Laterality Date  . Spine surgery      spinal fusion  . Av fistula placement Left 01/16/2014    Procedure: ARTERIOVENOUS (AV) FISTULA CREATION; ULTRASOUND GUIDED;  Surgeon: Larina Earthlyodd F Early, MD;  [facility];  Service: Vascular;  Laterality: Left;   FH No family history on file. SH  reports that he has never smoked. He has never used smokeless tobacco. He reports that he does not drink alcohol or use illicit drugs. Allergies  Allergies  Allergen Reactions  . Lisinopril Other (See Comments)    Per MAR   Home medications Prior to Admission medications   Medication Sig Start Date End Date Taking? Authorizing Provider  AMBULATORY NON FORMULARY MEDICATION Lorazepam 1mg /ml Gel Sig: Apply 1ml topically twice daily as needed for anxiety 03/21/14  Yes Kimber RelicArthur G Green, MD  Amino Acids-Protein Hydrolys (FEEDING SUPPLEMENT, PRO-STAT SUGAR FREE 64,) LIQD Take 30 mLs by mouth 2 (two) times daily. 10am and 10pm   Yes Historical Provider, MD  aspirin 81 MG tablet Take 81 mg by mouth daily.   Yes Historical  Provider, MD  atorvastatin (LIPITOR) 80 MG tablet Take 80 mg by mouth at bedtime. For hypercholesterolemia   Yes Historical Provider, MD  B Complex-C (SUPER B COMPLEX) TABS Take 1 tablet by mouth daily.    Yes Historical Provider, MD  calcium acetate (PHOSLO) 667 MG capsule Take 667-2,001 mg by mouth 4 (four) times daily - after meals and at bedtime. Take 3 capsules (2001 mg) 3 times daily with meals, take 1 capsule (667 mg) every night at bedtime with snack   Yes Historical Provider, MD  carvedilol (COREG) 25 MG tablet Take 25 mg by mouth 2 (two) times daily with a meal. For HTN (10am & 10pm)   Yes Historical Provider, MD  cloNIDine (CATAPRES - DOSED IN MG/24 HR) 0.3 mg/24hr patch Place 0.2 mg onto the skin once a week.   Yes Historical Provider, MD  clopidogrel (PLAVIX) 75 MG tablet Take 75 mg by mouth daily.    Yes Historical Provider, MD  ferrous sulfate 325 (65 FE) MG tablet Take 325 mg by mouth 3 (three) times daily with meals. 10am, 2pm, 10pm   Yes Historical Provider, MD  guaifenesin (ROBITUSSIN) 100 MG/5ML syrup Take 200 mg by mouth every 4 (four) hours as needed for cough.   Yes Historical Provider, MD  hydrALAZINE (APRESOLINE) 50 MG tablet Take 75 mg by mouth 3 (three)  times daily. For HTN (10am, 2pm, 10pm)   Yes Historical Provider, MD  hydrOXYzine (ATARAX/VISTARIL) 25 MG tablet Take 25 mg by mouth every 4 (four) hours as needed for anxiety.    Yes Historical Provider, MD  insulin aspart (NOVOLOG) 100 UNIT/ML injection Inject 0-9 Units into the skin 3 (three) times daily with meals. 03/20/14  Yes [name], MD  insulin aspart (NOVOLOG) 100 UNIT/ML injection Inject 6 Units into the skin at bedtime.   Yes Historical Provider, MD  insulin aspart (NOVOLOG) 100 UNIT/ML injection Inject 2 Units into the skin 3 (three) times daily before meals.   Yes Historical Provider, MD  insulin glargine (LANTUS) 100 UNIT/ML injection Inject 12 Units into the skin at bedtime.   Yes Historical Provider, MD   isosorbide mononitrate (IMDUR) 60 MG 24 hr tablet Take 60 mg by mouth daily. For HTN   Yes Historical Provider, MD  mirtazapine (REMERON) 15 MG tablet Take 15 mg by mouth at bedtime.   Yes Historical Provider, MD  nitroGLYCERIN (NITROSTAT) 0.4 MG SL tablet Place 0.4 mg under the tongue every 5 (five) minutes as needed for chest pain (May repeat for 3 doses as needed for chest pain. If chest pain persist call the MD.).   Yes Historical Provider, MD  ondansetron (ZOFRAN) 4 MG tablet Take 4 mg by mouth every 8 (eight) hours as needed for nausea or vomiting.   Yes Historical Provider, MD  oxyCODONE-acetaminophen (PERCOCET/ROXICET) 5-325 MG per tablet Take one tablet by mouth every 6 hours as needed for severe pain 03/21/14  Yes Mahima Pandey, MD  pantoprazole (PROTONIX) 40 MG tablet Take 40 mg by mouth 2 (two) times daily. For esophageal reflux (6:30am & 4:30pm)   Yes Historical Provider, MD  PRESCRIPTION MEDICATION Take 1 packet by mouth daily. Novosource (supplement)   Yes Historical Provider, MD  promethazine (PHENERGAN) 25 MG tablet Take 25 mg by mouth once.   Yes Historical Provider, MD  sertraline (ZOLOFT) 100 MG tablet Take 100 mg by mouth daily. For mood   Yes Historical Provider, MD    Current Medications   CBC  Recent Labs Lab 04/05/14 0206 04/05/14 0229  WBC 3.7*  --   NEUTROABS 3.4  --   HGB 8.9* 9.9*  HCT 28.2* 29.0*  MCV 103.3*  --   PLT 178  --    Basic Metabolic Panel  Recent Labs Lab 04/05/14 0206 04/05/14 0229  NA 136* 134*  K >7.7* 8.0*  CL 93* 101  CO2 14*  --   GLUCOSE 215* 208*  BUN 154* >140*  CREATININE 12.77* 13.60*  CALCIUM 9.3  --     Physical Exam  Blood pressure 130/49, pulse 25, temperature 98.3 F (36.8 C), temperature source Oral, resp. rate 12, SpO2 99.00%. GEN: intubated, sedated ENT: ETT in place EYES: eyes closed CV: slow,regular, no mgr PULM: coarse bs b/l ABD: soft, nd SKIN: No rashes/lesions EXT:No LEE VASC: L UA AVF +B/T,  appears usuable  Data: EKG with junctional bradycardia, no clear peaked Ts, normal QRS interval.   A/P 1. Hyperkalemia: EMergent HD tonight.  Cont medical mgmt until HD ready. 2. Bradycardia: hopefully will improve with HD; on carvedilol and clonidine 3. ESRD: will obtain old records in AM 4. HTN/Vol:goal 1L UF tonight, if achievable 5. Anemia: Hb 9.9.  Stable  Sabra Heck MD 04/05/2014, 3:33 AM

## 2014-04-05 NOTE — ED Notes (Signed)
Paged respiratory therapy and spoke to [facility] for assist with transport to ICU.

## 2014-04-05 NOTE — ED Notes (Signed)
Potassium greater than 7.7

## 2014-04-05 NOTE — Progress Notes (Signed)
INITIAL NUTRITION ASSESSMENT  DOCUMENTATION CODES Per approved criteria  -Non-severe (moderate) malnutrition in the context of chronic illness   INTERVENTION:  Utilize 50M PEPuP Protocol: initiate TF via OGT with Nepro at 25 ml/h and Prostat 30 ml BID on day 1; on day 2, continue at goal rate of 25 ml/h (600 ml per day) to provide 1280 kcals (93% of estimated needs), 79 gm protein, 436 ml free water daily.  NUTRITION DIAGNOSIS: Inadequate oral intake related to inability to eat as evidenced by NPO status.   Goal: Intake to meet >90% of estimated nutrition needs.  Monitor:  TF tolerance/adequacy, weight trend, labs, vent status.  Reason for Assessment: MD Consult for TF initiation and management.  73 y.o. male  Admitting Dx: Hyperkalemia  ASSESSMENT: 73 year old [place] male, ESRD on HD, Advanced dementia. Missed dialysis 5/26. [facility], brought to [place]. Found to be hyperkalemic in need of urgent dialysis. Two recent hospitalizations including [facility] for pseudomonas bacteremia, and most recently [facility] for missing several dialysis treatments, discharged 03/20/2014.  Nutrition Focused Physical Exam:  Subcutaneous Fat:  Orbital Region: WNL Upper Arm Region: WNL Thoracic and Lumbar Region: NA  Muscle:  Temple Region: WNL Clavicle Bone Region: mild depletion Clavicle and Acromion Bone Region: mild depletion Scapular Bone Region: NA Dorsal Hand: WNL Patellar Region: mild depletion Anterior Thigh Region: mild depletion Posterior Calf Region: mild depletion  Edema: none in LE  Pt meets criteria for non-severe (moderate) MALNUTRITION in the context of chronic illness as evidenced by 13% weight loss in 3 months with mild depletion of muscle mass.  Patient is currently intubated on ventilator support MV: 6.1 L/min Temp (24hrs), Avg:97.8 F (36.6 C), Min:97.6 F (36.4 C), Max:98.3 F (36.8 C)   Height: Ht Readings from Last 1 Encounters:   04/05/14 5\' 5"  (1.651 m)    Weight: Wt Readings from Last 1 Encounters:  04/05/14 139 lb 1.8 oz (63.1 kg)    Ideal Body Weight: 61.8 kg  % Ideal Body Weight: 102%  Wt Readings from Last 10 Encounters:  04/05/14 139 lb 1.8 oz (63.1 kg)  04/03/14 148 lb (67.132 kg)  03/19/14 140 lb 14 oz (63.9 kg)  03/02/14 146 lb (66.225 kg)  01/10/14 160 lb 15 oz (73 kg)    Usual Body Weight: 160 lb  % Usual Body Weight: 87%  BMI:  Body mass index is 23.15 kg/(m^2).  Estimated Nutritional Needs: Kcal: 1375 Protein: 75-87 gm Fluid: 1.2 L  Skin: no wounds  Diet Order: NPO  EDUCATION NEEDS: -Education not appropriate at this time   Intake/Output Summary (Last 24 hours) at 04/05/14 1003 Last data filed at 04/05/14 0800  Gross per 24 hour  Intake 114.24 ml  Output   1100 ml  Net -985.76 ml    Last BM: None documented since admission   Labs:   Recent Labs Lab 04/05/14 0206 04/05/14 0229 04/05/14 0344 04/05/14 0730  NA 136* 134* 137 140  K >7.7* 8.0* 7.5* 4.4  CL 93* 101 95* 97  CO2 14*  --  15* 24  BUN 154* >140* 157* 77*  CREATININE 12.77* 13.60* 12.94* 6.67*  CALCIUM 9.3  --  10.3 9.1  MG  --   --  4.4*  --   PHOS  --   --  15.5*  --   GLUCOSE 215* 208* 256* 107*    CBG (last 3)   Recent Labs  04/05/14 0449 04/05/14 0551 04/05/14  0815  GLUCAP 202* 151* 100*    Scheduled Meds: . antiseptic oral rinse  15 mL Mouth Rinse QID  . [START ON 04/06/2014] aspirin  81 mg Oral Daily  . atorvastatin  80 mg Oral q1800  . carvedilol  3.125 mg Oral BID WC  . chlorhexidine  15 mL Mouth Rinse BID  . Chlorhexidine Gluconate Cloth  6 each Topical Q0600  . clopidogrel  75 mg Oral Daily  . feeding supplement (VITAL HIGH PROTEIN)  1,000 mL Per Tube Q24H  . fentaNYL  100 mcg Intravenous Once  . heparin  5,000 Units Subcutaneous 3 times per day  . hydrALAZINE  75 mg Oral TID  . insulin aspart  2-6 Units Subcutaneous 6 times per day  . lidocaine (cardiac) 100 mg/53ml       . mirtazapine  15 mg Oral QHS  . mupirocin ointment  1 application Nasal BID  . ondansetron (ZOFRAN) IV  4 mg Intravenous Once  . pantoprazole sodium  40 mg Per Tube Daily  . piperacillin-tazobactam (ZOSYN)  IV  2.25 g Intravenous 3 times per day  . sertraline  100 mg Oral Daily    Continuous Infusions: . sodium chloride 10 mL/hr at 04/05/14 0800  . dexmedetomidine    . fentaNYL infusion INTRAVENOUS Stopped (04/05/14 0800)    Past Medical History  Diagnosis Date  . 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 Hypertension   . CAD (coronary artery disease)      with angioplasty  . 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 Cardiomyopathy     with ejection fraction of 45 %  . Heart failure, systolic and diastolic   . Anemia   . CHF (congestive heart failure)     Past Surgical History  Procedure Laterality Date  . Spine surgery      spinal fusion  . Av fistula placement Left 01/16/2014    Procedure: ARTERIOVENOUS (AV) FISTULA CREATION; ULTRASOUND GUIDED;  Surgeon: Larina Earthly, MD;  Location: [facility] OR;  Service: Vascular;  Laterality: Left;    Joaquin Courts, RD, LDN, CNSC Pager [phone] After Hours Pager [phone]

## 2014-04-05 NOTE — Progress Notes (Signed)
CRITICAL VALUE ALERT  Critical value received: potassium 7.5  Date of notification:  04-05-14  Time of notification:  0552  Critical value read back:yes  Nurse who received alert:  Jamesetta So RN  MD notified (1st page): NA, value respected, pt starting on dialysis

## 2014-04-05 NOTE — Progress Notes (Signed)
PULMONARY / CRITICAL CARE MEDICINE   Name: Albert Grant MRN: [id number] DOB: Feb 24, 1941    ADMISSION DATE:  04/05/2014 CONSULTATION DATE:  04/05/2014  REFERRING MD :  EDP PRIMARY SERVICE: PCCM  CHIEF COMPLAINT:  Hyperkalemia  BRIEF PATIENT DESCRIPTION: 73 year old [place] male, ESRD on HD, Advanced dementia. Missed dialysis 5/26. AMS and vomiting at [facility] 5/26, brought to [place]. Found to be hyperkalemic in need of urgent dialysis. PCCM to admit.   PMH -ESRD on HD, DM, CAD, and CHF (LVEF reportedly 45%). Resides in SNF due to severe dementia. Two recent hospitalizations including one at [facility] for pseudomonas bacteremia, and most recently at [facility] for missing several dialysis treatments, discharged 03/20/2014.  SIGNIFICANT EVENTS / STUDIES:  3/9 - LUE AV fistula placed.  5/27 - admitted for emergent HD  LINES / TUBES: OETT 5/27 >>> R fem CVL 5/27 >>>  CULTURES: None  ANTIBIOTICS: Zosyn 5/27 >>>   SUBJECTIVE: afebrile Lethargic , on wua , off versed gtt  VITAL SIGNS: Temp:  [97.2 F (36.2 C)-98.3 F (36.8 C)] 97.6 F (36.4 C) (05/27 1618) Pulse Rate:  [25-66] 61 (05/27 1600) Resp:  [9-24] 10 (05/27 1600) BP: (130-190)/(49-98) 141/56 mmHg (05/27 1600) SpO2:  [99 %-100 %] 100 % (05/27 1600) FiO2 (%):  [40 %-100 %] 40 % (05/27 1530) Weight:  [63.1 kg (139 lb 1.8 oz)-65.4 kg (144 lb 2.9 oz)] 63.1 kg (139 lb 1.8 oz) (05/27 0731) HEMODYNAMICS:   VENTILATOR SETTINGS: Vent Mode:  [-] CPAP;PSV FiO2 (%):  [40 %-100 %] 40 % Set Rate:  [14 bmp-24 bmp] 14 bmp Vt Set:  [490 mL] 490 mL PEEP:  [5 cmH20] 5 cmH20 Pressure Support:  [7 cmH20] 7 cmH20 Plateau Pressure:  [14 cmH20] 14 cmH20 INTAKE / OUTPUT: Intake/Output     05/26 0701 - 05/27 0700 05/27 0701 - 05/28 0700   I.V. (mL/kg) 6 (0.1) 109.8 (1.7)   NG/GT  141.7   IV Piggyback 50 50   Total Intake(mL/kg) 56 (0.9) 301.5 (4.8)   Urine (mL/kg/hr) 100    Other  1000 (1.7)   Total Output 100 1000   Net -44 -698.5           PHYSICAL EXAMINATION: General:  Intubated, lethargic elderly male Neuro:  Lethargic, follows commands bilaterally HEENT:  [place]/AT, no JVD noted Cardiovascular:  Huston Foley, regular Lungs:  Scattered rhonchi Abdomen:  Soft, non-distended Musculoskeletal:  No acute deformity, moves all extremities  Skin:  Intact  LABS:  CBC  Recent Labs Lab 04/05/14 0206 04/05/14 0229 04/05/14 0500  WBC 3.7*  --  4.2  HGB 8.9* 9.9* 8.4*  HCT 28.2* 29.0* 25.4*  PLT 178  --  187   Coag's No results found for this basename: APTT, INR,  in the last 168 hours BMET  Recent Labs Lab 04/05/14 0206 04/05/14 0229 04/05/14 0344 04/05/14 0730  NA 136* 134* 137 140  K >7.7* 8.0* 7.5* 4.4  CL 93* 101 95* 97  CO2 14*  --  15* 24  BUN 154* >140* 157* 77*  CREATININE 12.77* 13.60* 12.94* 6.67*  GLUCOSE 215* 208* 256* 107*   Electrolytes  Recent Labs Lab 04/05/14 0206 04/05/14 0344 04/05/14 0730 04/05/14 1029  CALCIUM 9.3 10.3 9.1  --   MG  --  4.4*  --   --   PHOS  --  15.5*  --  8.3*   Sepsis Markers  Recent Labs Lab 04/05/14 0215 04/05/14 0424 04/05/14 0643  LATICACIDVEN 1.67 2.2  --  PROCALCITON  --   --  3.94   ABG No results found for this basename: PHART, PCO2ART, PO2ART,  in the last 168 hours Liver Enzymes  Recent Labs Lab 04/05/14 0206  AST 452*  ALT 458*  ALKPHOS 257*  BILITOT 0.7  ALBUMIN 3.1*   Cardiac Enzymes  Recent Labs Lab 04/05/14 0424 04/05/14 0931 04/05/14 1500  TROPONINI <0.30 <0.30 <0.30   Glucose  Recent Labs Lab 04/05/14 0322 04/05/14 0449 04/05/14 0551 04/05/14 0815 04/05/14 1134 04/05/14 1602  GLUCAP 249* 202* 151* 100* 72 136*    Imaging Dg Chest Port 1 View  04/05/2014   CLINICAL DATA:  Endotracheal tube positioning.  EXAM: PORTABLE CHEST - 1 VIEW  COMPARISON:  Chest radiograph performed earlier today at 1:52 a.m.  FINDINGS: The patient's endotracheal tube is seen ending 2-3 cm above the carina. An enteric tube is noted  extending below the diaphragm.  There is mild elevation of the right hemidiaphragm. Vascular congestion is noted. Mild bilateral atelectasis is seen. No pleural effusion or pneumothorax is identified, though the left costophrenic angle is incompletely imaged on this study.  The cardiomediastinal silhouette is mildly enlarged. No acute osseous abnormalities are identified. External pacing pads are seen.  IMPRESSION: 1. Endotracheal tube seen ending 2 3 cm above the carina. 2. Mild elevation of the right hemidiaphragm. Vascular congestion and mild cardiomegaly noted. Mild bilateral atelectasis seen.   Electronically Signed   By: Roanna Raider M.D.   On: 04/05/2014 04:48   Dg Chest Port 1 View  04/05/2014   CLINICAL DATA:  Crackles, shortness of breath, history cardiomyopathy, CHF, diabetes, hypertension, coronary artery disease, end-stage renal disease  EXAM: PORTABLE CHEST - 1 VIEW  COMPARISON:  Portable exam 0152 hr compared to 03/14/2014  FINDINGS: Enlargement of cardiac silhouette with pulmonary vascular congestion.  Atherosclerotic calcification aorta.  Mediastinal contours otherwise normal.  Minimal RIGHT base atelectasis.  No acute infiltrate, pleural effusion or pneumothorax.  Coronary arterial stent noted projecting over LEFT heart.  Bones unremarkable.  IMPRESSION: Enlargement of cardiac silhouette with pulmonary vascular congestion.  Minimal RIGHT base atelectasis.   Electronically Signed   By: Ulyses Southward M.D.   On: 04/05/2014 02:32    ASSESSMENT / PLAN:  PULMONARY A: Acute Respiratory Failure intubated for airway protection in setting of AMS and vomiting Likely Aspiration PNA  P:   VAP prevention per protocol SBTs -Will need to discuss DNI status pre extubation  CARDIOVASCULAR A:  Bradycardia -  In setting of hyperkalemia, atropine x 1 in ED Elevated troponin in setting of ESRD, possibly demand ischemia from bradycardia.  H/o HTN, CHF does not appear volume overloaded  P:  Resume  home lipitor, coreg,  plavix, hydralazine,  Hold clonidine,imdur for now   RENAL A:   ESRD on HD Hyperkalemia (Insulin, calcium chloride given x2 in ED) -Emergent HD  P:   Nephrology following   GASTROINTESTINAL A:   GERD Elevated LFT, r/o congestive hepatopathy P:   Protonix BID (home dose) NPO Follow LFT May need acute hep panel  HEMATOLOGIC A:   Anemia of chronic illness  P:  Follow CBC dvt prevention, consider sub q hep  INFECTIOUS A:   Probable Aspiration PNA Leukopenia with left shift Recent pseudomonas bacteremia (not sure how recent)  P:   ABX and cultures as above PCt 3.9 - limit abx once down  empiri czosyn for now, want to dc in 72 hr if good clinical stats and pct low, in ESRD may be  flat   ENDOCRINE A:   DM  P:   CBG and SSI  NEUROLOGIC A:   Acute on chronic encephalopathy Dementia appears severe at baseline Depression  P:   Resume  home zoloft, remeron, for now. WUA post HD fent  GLOBAL: Full code from SNF. Was seen by palliative care last admission but they were unable to communicate effectively with family, who missed their meeting with palliative care. Needs a GOC discussion - will reinvolve palliative care . Spoke to son - pt refuses rx when by himself,  May need to obtain legal guardianship to avoid readmissions, needs comprehensive goals of care discussion including HD, he is extubatable soon   I have personally obtained a history, examined the patient, evaluated laboratory and imaging results, formulated the assessment and plan and placed orders. CRITICAL CARE: The patient is critically ill with multiple organ systems failure and requires high complexity decision making for assessment and support, frequent evaluation and titration of therapies, application of advanced monitoring technologies and extensive interpretation of multiple databases. Critical Care Time devoted to patient care services described in this note is 35 minutes.    Cyril Mourningakesh Alva MD. Tonny BollmanFCCP. [place] [facility] Pager [phone] If no response call [phone]

## 2014-04-06 DIAGNOSIS — Z515 Encounter for palliative care: Secondary | ICD-10-CM

## 2014-04-06 DIAGNOSIS — R531 Weakness: Secondary | ICD-10-CM

## 2014-04-06 DIAGNOSIS — Z7189 Other specified counseling: Secondary | ICD-10-CM

## 2014-04-06 LAB — COMPREHENSIVE METABOLIC PANEL
ALK PHOS: 198 U/L — AB (ref 39–117)
ALT: 289 U/L — ABNORMAL HIGH (ref 0–53)
AST: 145 U/L — AB (ref 0–37)
Albumin: 2.5 g/dL — ABNORMAL LOW (ref 3.5–5.2)
BUN: 104 mg/dL — ABNORMAL HIGH (ref 6–23)
CO2: 22 meq/L (ref 19–32)
CREATININE: 9.08 mg/dL — AB (ref 0.50–1.35)
Calcium: 8.6 mg/dL (ref 8.4–10.5)
Chloride: 96 mEq/L (ref 96–112)
GFR calc Af Amer: 6 mL/min — ABNORMAL LOW (ref >90)
GFR, EST NON AFRICAN AMERICAN: 5 mL/min — AB (ref >90)
Glucose, Bld: 137 mg/dL — ABNORMAL HIGH (ref 70–99)
POTASSIUM: 4.6 meq/L (ref 3.7–5.3)
Sodium: 138 mEq/L (ref 137–147)
Total Bilirubin: 0.4 mg/dL (ref 0.3–1.2)
Total Protein: 5.9 g/dL — ABNORMAL LOW (ref 6.0–8.3)

## 2014-04-06 LAB — URINE CULTURE
CULTURE: NO GROWTH
CULTURE: NO GROWTH
Colony Count: NO GROWTH
Colony Count: NO GROWTH

## 2014-04-06 LAB — GLUCOSE, CAPILLARY
GLUCOSE-CAPILLARY: 42 mg/dL — AB (ref 70–99)
GLUCOSE-CAPILLARY: 97 mg/dL (ref 70–99)
GLUCOSE-CAPILLARY: 70 mg/dL (ref 70–99)
Glucose-Capillary: 128 mg/dL — ABNORMAL HIGH (ref 70–99)
Glucose-Capillary: 131 mg/dL — ABNORMAL HIGH (ref 70–99)
Glucose-Capillary: 145 mg/dL — ABNORMAL HIGH (ref 70–99)
Glucose-Capillary: 159 mg/dL — ABNORMAL HIGH (ref 70–99)

## 2014-04-06 LAB — CBC
HCT: 26.9 % — ABNORMAL LOW (ref 39.0–52.0)
Hemoglobin: 8.8 g/dL — ABNORMAL LOW (ref 13.0–17.0)
MCH: 33.5 pg (ref 26.0–34.0)
MCHC: 32.7 g/dL (ref 30.0–36.0)
MCV: 102.3 fL — AB (ref 78.0–100.0)
Platelets: 177 10*3/uL (ref 150–400)
RBC: 2.63 MIL/uL — AB (ref 4.22–5.81)
RDW: 15.3 % (ref 11.5–15.5)
WBC: 5.5 10*3/uL (ref 4.0–10.5)

## 2014-04-06 LAB — IRON AND TIBC
IRON: 24 ug/dL — AB (ref 42–135)
Saturation Ratios: 14 % — ABNORMAL LOW (ref 20–55)
TIBC: 170 ug/dL — ABNORMAL LOW (ref 215–435)
UIBC: 146 ug/dL (ref 125–400)

## 2014-04-06 LAB — PROCALCITONIN: Procalcitonin: 4.4 ng/mL

## 2014-04-06 LAB — PHOSPHORUS
Phosphorus: 10.4 mg/dL — ABNORMAL HIGH (ref 2.3–4.6)
Phosphorus: 11.3 mg/dL — ABNORMAL HIGH (ref 2.3–4.6)

## 2014-04-06 MED ORDER — HALOPERIDOL LACTATE 5 MG/ML IJ SOLN
1.0000 mg | INTRAMUSCULAR | Status: DC | PRN
  Administered 2014-04-06 (×2): 2 mg via INTRAVENOUS
  Administered 2014-04-07: 4 mg via INTRAVENOUS
  Filled 2014-04-06 (×2): qty 1

## 2014-04-06 MED ORDER — SEVELAMER CARBONATE 2.4 G PO PACK
2.4000 g | PACK | Freq: Three times a day (TID) | ORAL | Status: DC
  Administered 2014-04-06 – 2014-04-11 (×8): 2.4 g
  Filled 2014-04-06 (×18): qty 1

## 2014-04-06 MED ORDER — DEXTROSE 50 % IV SOLN
INTRAVENOUS | Status: AC
  Administered 2014-04-06: 50 mL via INTRAVENOUS
  Filled 2014-04-06: qty 50

## 2014-04-06 MED ORDER — FENTANYL CITRATE 0.05 MG/ML IJ SOLN: 25.0000 ug | INTRAMUSCULAR | Status: DC | PRN

## 2014-04-06 MED ORDER — CHLORHEXIDINE GLUCONATE CLOTH 2 % EX PADS
6.0000 | MEDICATED_PAD | Freq: Every day | CUTANEOUS | Status: DC
  Administered 2014-04-06 – 2014-04-08 (×3): 6 via TOPICAL

## 2014-04-06 MED ORDER — DARBEPOETIN ALFA-POLYSORBATE 200 MCG/0.4ML IJ SOLN
INTRAMUSCULAR | Status: AC
  Filled 2014-04-06: qty 0.4

## 2014-04-06 MED ORDER — DARBEPOETIN ALFA-POLYSORBATE 200 MCG/0.4ML IJ SOLN
200.0000 ug | INTRAMUSCULAR | Status: DC
  Administered 2014-04-06: 200 ug via INTRAVENOUS
  Filled 2014-04-06: qty 0.4

## 2014-04-06 MED ORDER — HALOPERIDOL LACTATE 5 MG/ML IJ SOLN
INTRAMUSCULAR | Status: AC
  Administered 2014-04-06: 2 mg via INTRAVENOUS
  Filled 2014-04-06: qty 1

## 2014-04-06 MED ORDER — CHLORHEXIDINE GLUCONATE 0.12 % MT SOLN
15.0000 mL | Freq: Two times a day (BID) | OROMUCOSAL | Status: DC
  Administered 2014-04-07: 15 mL via OROMUCOSAL
  Filled 2014-04-06 (×3): qty 15

## 2014-04-06 MED ORDER — BIOTENE DRY MOUTH MT LIQD
15.0000 mL | Freq: Two times a day (BID) | OROMUCOSAL | Status: DC
  Administered 2014-04-07 (×2): 15 mL via OROMUCOSAL

## 2014-04-06 MED ORDER — DEXMEDETOMIDINE HCL IN NACL 400 MCG/100ML IV SOLN
0.4000 ug/kg/h | INTRAVENOUS | Status: DC
  Administered 2014-04-06: 1 ug/kg/h via INTRAVENOUS
  Filled 2014-04-06: qty 100

## 2014-04-06 MED ORDER — DEXTROSE 50 % IV SOLN
50.0000 mL | Freq: Once | INTRAVENOUS | Status: AC | PRN
  Administered 2014-04-06: 50 mL via INTRAVENOUS

## 2014-04-06 MED FILL — Medication: Qty: 1 | Status: AC

## 2014-04-06 NOTE — Progress Notes (Signed)
PULMONARY / CRITICAL CARE MEDICINE   Name: Albert Grant MRN: [id number] DOB: [birth year]    ADMISSION DATE:  04/05/2014 CONSULTATION DATE:  04/05/2014  REFERRING MD :  EDP PRIMARY SERVICE: PCCM  CHIEF COMPLAINT:  Hyperkalemia  BRIEF PATIENT DESCRIPTION: 73 year old [place] male, ESRD on HD, Advanced dementia. Missed dialysis 5/26. AMS and vomiting at [facility] 5/26, brought to [place]. Found to be hyperkalemic in need of urgent dialysis. PCCM to admit.   PMH -ESRD on HD, DM, CAD, and CHF (LVEF reportedly 45%). Resides in SNF due to severe dementia. Two recent hospitalizations including one at [facility] for pseudomonas bacteremia, and most recently at [facility] for missing several dialysis treatments, discharged 03/20/2014.  SIGNIFICANT EVENTS / STUDIES:  3/9 - LUE AV fistula placed.  5/27 - admitted for emergent HD  LINES / TUBES: OETT 5/27 >>>5/28 R fem CVL 5/27 >>>  CULTURES: None  ANTIBIOTICS: Zosyn 5/27 >>>   SUBJECTIVE: afebrile Lethargic , on wua , off fent gtt Int agitation then goes back to sleep  VITAL SIGNS: Temp:  [97.6 F (36.4 C)-98 F (36.7 C)] 98 F (36.7 C) (05/28 1130) Pulse Rate:  [55-61] 57 (05/28 1345) Resp:  [9-17] 16 (05/28 1330) BP: (101-154)/(42-66) 118/46 mmHg (05/28 1345) SpO2:  [98 %-100 %] 100 % (05/28 1204) FiO2 (%):  [40 %] 40 % (05/28 0940) Weight:  [63.6 kg (140 lb 3.4 oz)] 63.6 kg (140 lb 3.4 oz) (05/28 1130) HEMODYNAMICS:   VENTILATOR SETTINGS: Vent Mode:  [-] PRVC FiO2 (%):  [40 %] 40 % Set Rate:  [14 bmp] 14 bmp Vt Set:  [490 mL] 490 mL PEEP:  [5 cmH20] 5 cmH20 Pressure Support:  [7 cmH20] 7 cmH20 Plateau Pressure:  [14 cmH20-16 cmH20] 16 cmH20 INTAKE / OUTPUT: Intake/Output     05/27 0701 - 05/28 0700 05/28 0701 - 05/29 0700   I.V. (mL/kg) 506.7 (8) 50.8 (0.8)   NG/GT 741.7 105   IV Piggyback 150    Total Intake(mL/kg) 1398.4 (22) 155.8 (2.4)   Urine (mL/kg/hr)     Other 1000 (0.7)    Total Output 1000     Net +398.4 +155.8           PHYSICAL EXAMINATION: General:  Intubated, lethargic elderly male Neuro:  Lethargic, follows commands bilaterally HEENT:  [place]/AT, no JVD noted Cardiovascular:  Huston Foley, regular Lungs:  Scattered rhonchi Abdomen:  Soft, non-distended Musculoskeletal:  No acute deformity, moves all extremities  Skin:  Intact  LABS:  CBC  Recent Labs Lab 04/05/14 0206 04/05/14 0229 04/05/14 0500 04/06/14 0415  WBC 3.7*  --  4.2 5.5  HGB 8.9* 9.9* 8.4* 8.8*  HCT 28.2* 29.0* 25.4* 26.9*  PLT 178  --  187 177   Coag's No results found for this basename: APTT, INR,  in the last 168 hours BMET  Recent Labs Lab 04/05/14 0344 04/05/14 0730 04/06/14 0415  NA 137 140 138  K 7.5* 4.4 4.6  CL 95* 97 96  CO2 15* 24 22  BUN 157* 77* 104*  CREATININE 12.94* 6.67* 9.08*  GLUCOSE 256* 107* 137*   Electrolytes  Recent Labs Lab 04/05/14 0344 04/05/14 0730  04/05/14 2229 04/06/14 0415 04/06/14 1110  CALCIUM 10.3 9.1  --   --  8.6  --   MG 4.4*  --   --   --   --   --   PHOS 15.5*  --   < > 9.5* 10.4* 11.3*  < > = values in this  interval not displayed. Sepsis Markers  Recent Labs Lab 04/05/14 0215 04/05/14 0424 04/05/14 0643 04/06/14 0415  LATICACIDVEN 1.67 2.2  --   --   PROCALCITON  --   --  3.94 4.40   ABG No results found for this basename: PHART, PCO2ART, PO2ART,  in the last 168 hours Liver Enzymes  Recent Labs Lab 04/05/14 0206 04/06/14 0415  AST 452* 145*  ALT 458* 289*  ALKPHOS 257* 198*  BILITOT 0.7 0.4  ALBUMIN 3.1* 2.5*   Cardiac Enzymes  Recent Labs Lab 04/05/14 0424 04/05/14 0931 04/05/14 1500  TROPONINI <0.30 <0.30 <0.30   Glucose  Recent Labs Lab 04/05/14 1602 04/05/14 1930 04/06/14 0024 04/06/14 0339 04/06/14 0834 04/06/14 1117  GLUCAP 136* 101* 128* 131* 145* 159*    Imaging Dg Chest Port 1 View  04/05/2014   CLINICAL DATA:  Endotracheal tube positioning.  EXAM: PORTABLE CHEST - 1 VIEW  COMPARISON:  Chest radiograph performed earlier  today at 1:52 a.m.  FINDINGS: The patient's endotracheal tube is seen ending 2-3 cm above the carina. An enteric tube is noted extending below the diaphragm.  There is mild elevation of the right hemidiaphragm. Vascular congestion is noted. Mild bilateral atelectasis is seen. No pleural effusion or pneumothorax is identified, though the left costophrenic angle is incompletely imaged on this study.  The cardiomediastinal silhouette is mildly enlarged. No acute osseous abnormalities are identified. External pacing pads are seen.  IMPRESSION: 1. Endotracheal tube seen ending 2 3 cm above the carina. 2. Mild elevation of the right hemidiaphragm. Vascular congestion and mild cardiomegaly noted. Mild bilateral atelectasis seen.   Electronically Signed   By: Roanna RaiderJeffery  Chang M.D.   On: 04/05/2014 04:48   Dg Chest Port 1 View  04/05/2014   CLINICAL DATA:  Crackles, shortness of breath, history cardiomyopathy, CHF, diabetes, hypertension, coronary artery disease, end-stage renal disease  EXAM: PORTABLE CHEST - 1 VIEW  COMPARISON:  Portable exam 0152 hr compared to 03/14/2014  FINDINGS: Enlargement of cardiac silhouette with pulmonary vascular congestion.  Atherosclerotic calcification aorta.  Mediastinal contours otherwise normal.  Minimal RIGHT base atelectasis.  No acute infiltrate, pleural effusion or pneumothorax.  Coronary arterial stent noted projecting over LEFT heart.  Bones unremarkable.  IMPRESSION: Enlargement of cardiac silhouette with pulmonary vascular congestion.  Minimal RIGHT base atelectasis.   Electronically Signed   By: Ulyses SouthwardMark  Boles M.D.   On: 04/05/2014 02:32    ASSESSMENT / PLAN:  PULMONARY A: Acute Respiratory Failure intubated for airway protection in setting of AMS and vomiting Likely Aspiration PNA  P:   VAP prevention per protocol SBTs with goal extubation  CARDIOVASCULAR A:  Bradycardia -  In setting of hyperkalemia, atropine x 1 in ED Elevated troponin in setting of ESRD,  possibly demand ischemia from bradycardia.  H/o HTN, CHF does not appear volume overloaded  P:  Resume home lipitor, coreg,  plavix, hydralazine,  Hold clonidine,imdur for now   RENAL A:   ESRD on HD Hyperkalemia (Insulin, calcium chloride given x2 in ED) -Emergent HD  P:   Nephrology following   GASTROINTESTINAL A:   GERD Elevated LFT, r/o congestive hepatopathy - improving P:   Protonix BID (home dose) NPO Follow LFT May need acute hep panel  HEMATOLOGIC A:   Anemia of chronic illness  P:  Follow CBC sub q hep  INFECTIOUS A:   Probable Aspiration PNA Leukopenia with left shift Recent pseudomonas bacteremia (not sure how recent)  P:   ABX and cultures as  above PCt trending up - limit abx once down  empiri czosyn for now, want to dc in 72 hr if good clinical stats and pct low, in ESRD may be flat   ENDOCRINE A:   DM  P:   CBG and SSI  NEUROLOGIC A:   Acute on chronic encephalopathy Dementia appears severe at baseline Depression  P:   Resume  home zoloft, remeron, for now. WUA  fent gtt & recedex-can dc once extubated  GLOBAL: Full code from SNF. Was seen by palliative care last admission but they were unable to communicate effectively with family, who missed their meeting with palliative care. Needs a GOC discussion - reinvolved palliative care . Spoke to son - pt refuses rx when by himself,  May need to obtain legal guardianship to avoid readmissions, needs comprehensive goals of care discussion including HD, he is extubatable   I have personally obtained a history, examined the patient, evaluated laboratory and imaging results, formulated the assessment and plan and placed orders. CRITICAL CARE: The patient is critically ill with multiple organ systems failure and requires high complexity decision making for assessment and support, frequent evaluation and titration of therapies, application of advanced monitoring technologies and extensive  interpretation of multiple databases. Critical Care Time devoted to patient care services described in this note is 35 minutes.   Cyril Mourning MD. Tonny Bollman.  Pulmonary & Critical care Pager [phone] If no response call [phone]

## 2014-04-06 NOTE — Consult Note (Signed)
I have reviewed this case with our NP and agree with the Assessment and Plan as stated.  Fines Kimberlin L. Giana Castner, MD MBA The Palliative Medicine Team at  Team Phone: [phone] Pager: [phone]   

## 2014-04-06 NOTE — Consult Note (Signed)
Patient Albert Grant      DOB: 27-May-1941      EAV:[id number]     Consult Note from the Palliative Medicine Team at [facility]    Consult Requested by: Dr Tyson Alias     PCP: No PCP Per Patient Reason for Consultation:Clarification of GOC and options     Phone Number:None  Assessment of patients Current state:   Family faced with advanced care decisions and anticipatory care needs.  Spoke by telephone with PCP Dr Kerry Dory, she reports minimal conversation with  the patient when she visit at the SNF, she cannot speak specifically to his mental status.  Language barrier precludes assessment of mental status/level of dementia/capacity.  Son reports his memory is "really bad".  Will secure interpretor for f/u meeting on Sunday 04-09-14 at 0900   Consult is for review of medical treatment options, clarification of goals of care and end of life issues, disposition and options, and symptom recommendation.  This NP Lorinda Creed reviewed medical records, received report from team, assessed the patient and then meet at the patient's bedside along with his son Kiam Bransfield and his wife   to discuss diagnosis prognosis, GOC, EOL wishes disposition and options.  Patient has a wife and other children who are not able to be present, will gather for f/u meeting on Sunday  A detailed discussion was had today regarding advanced directives.  Concepts specific to code status, artifical feeding and hydration, continued IV antibiotics and rehospitalization was had.  The difference between a aggressive medical intervention path  and a palliative comfort care path for this patient at this time was had.  Values and goals of care important to patient and family were attempted to be elicited.  Concept of Hospice and Palliative Care were discussed.    Questions and concerns addressed.  Hard Choices booklet left for review. Family encouraged to call with questions or concerns. MOST formed introduced and discussed.   PMT will continue to support holistically.   Goals of Care: 1.  Code Status:Full code   2. Scope of Treatment: At this time family wishes for all available and offered medical treatmetns to prolong life.  His son Orvilla Fus tells me he or his family have never had a conversation regarding the important decisions that they now face.  He is grateful for today's meeting and for the opportunity to gather his family with an interpretor and continue this discussion as a "whole family"unit.    3. Disposition: Pending outcomes of Sunday's mornings re-goals of care meeting with Dr Ladona Ridgel, family and an interpretor A Vietnamese interpretor hsd been scheduled for Sunday 5-31-15morning at 0900, any concerns call [facility] # [phone]  4. Symptom Management:   1.  Weakness:  Continue to treat the treatable with all offered and availabe medical interventions to prolong life `   5. Psychosocial:  Emotional support offered to family   Patient Documents Completed or Given: Document Given Completed  Advanced Directives Pkt    MOST yes   DNR    Gone from My Sight    Hard Choices yes     Brief HPI: 73 y.o. year old male with end-stage renal disease  Dialysis dependant recent admission at [facility] for Pseudomonas sepsis, type 2 diabetes, dementia, coronary artery disease presenting with encephalopathy, sepsis, hyperkalemia.  Documented non-compliance, uncertain mental status and capcity    ROS:  Unable to illicit due to decreased cognition   PMH:  Past Medical History  Diagnosis Date  . Diabetes mellitus  without complication     Type II  . Hypertension   . CAD (coronary artery disease)      with angioplasty  . 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 Cardiomyopathy     with ejection fraction of 45 %  . Heart failure, systolic and diastolic   . Anemia   . CHF (congestive heart failure)      PSH: Past Surgical History  Procedure Laterality Date  . Spine surgery      spinal fusion   . Av fistula placement Left 01/16/2014    Procedure: ARTERIOVENOUS (AV) FISTULA CREATION; ULTRASOUND GUIDED;  Surgeon: Larina Earthlyodd F Early, MD;  [facility];  Service: Vascular;  Laterality: Left;   I have reviewed the FH and SH and  If appropriate update it with new information. Allergies  Allergen Reactions  . Lisinopril Other (See Comments)    Per MAR   Scheduled Meds: . antiseptic oral rinse  15 mL Mouth Rinse QID  . aspirin  81 mg Oral Daily  . atorvastatin  80 mg Oral q1800  . chlorhexidine  15 mL Mouth Rinse BID  . Chlorhexidine Gluconate Cloth  6 each Topical Daily  . clopidogrel  75 mg Oral Daily  . darbepoetin (ARANESP) injection - DIALYSIS  200 mcg Intravenous Q Thu-HD  . feeding supplement (NEPRO CARB STEADY)  1,000 mL Oral Q24H  . feeding supplement (PRO-STAT SUGAR FREE 64)  30 mL Per Tube BID  . fentaNYL  100 mcg Intravenous Once  . heparin  5,000 Units Subcutaneous 3 times per day  . insulin aspart  2-6 Units Subcutaneous 6 times per day  . mirtazapine  15 mg Oral QHS  . mupirocin ointment  1 application Nasal BID  . ondansetron (ZOFRAN) IV  4 mg Intravenous Once  . pantoprazole sodium  40 mg Per Tube Daily  . piperacillin-tazobactam (ZOSYN)  IV  2.25 g Intravenous 3 times per day  . sertraline  100 mg Oral Daily  . sevelamer carbonate  2.4 g Per Tube TID   Continuous Infusions: . sodium chloride 10 mL/hr at 04/06/14 0700  . dexmedetomidine 1 mcg/kg/hr (04/06/14 16100823)  . fentaNYL infusion INTRAVENOUS Stopped (04/06/14 0806)   PRN Meds:.sodium chloride, sodium chloride, sodium chloride, feeding supplement (NEPRO CARB STEADY), heparin    BP 137/57  Pulse 56  Temp(Src) 98 F (36.7 C) (Oral)  Resp 14  Ht 5\' 5"  (1.651 m)  Wt 63.6 kg (140 lb 3.4 oz)  BMI 23.33 kg/m2  SpO2 100%   PPS:  30 % at best   Intake/Output Summary (Last 24 hours) at 04/06/14 0910 Last data filed at 04/06/14 0800  Gross per 24 hour  Intake 1385.9 ml  Output      0 ml  Net 1385.9 ml      Physical Exam:  General: chronically ill appearing, intubated HEENT:  Noted ET tube, moist buccal membranes Chest:  Scattered course BS CVS: bradycardic  Abdomen: soft +BS Ext: without edema   Labs: CBC    Component Value Date/Time   WBC 5.5 04/06/2014 0415   RBC 2.63* 04/06/2014 0415   HGB 8.8* 04/06/2014 0415   HCT 26.9* 04/06/2014 0415   PLT 177 04/06/2014 0415   MCV 102.3* 04/06/2014 0415   MCH 33.5 04/06/2014 0415   MCHC 32.7 04/06/2014 0415   RDW 15.3 04/06/2014 0415   LYMPHSABS 0.3* 04/05/2014 0206   MONOABS 0.1 04/05/2014 0206   EOSABS 0.0 04/05/2014 0206  BASOSABS 0.0 04/05/2014 0206    BMET    Component Value Date/Time   NA 138 04/06/2014 0415   K 4.6 04/06/2014 0415   CL 96 04/06/2014 0415   CO2 22 04/06/2014 0415   GLUCOSE 137* 04/06/2014 0415   BUN 104* 04/06/2014 0415   CREATININE 9.08* 04/06/2014 0415   CALCIUM 8.6 04/06/2014 0415   GFRNONAA 5* 04/06/2014 0415   GFRAA 6* 04/06/2014 0415    CMP     Component Value Date/Time   NA 138 04/06/2014 0415   K 4.6 04/06/2014 0415   CL 96 04/06/2014 0415   CO2 22 04/06/2014 0415   GLUCOSE 137* 04/06/2014 0415   BUN 104* 04/06/2014 0415   CREATININE 9.08* 04/06/2014 0415   CALCIUM 8.6 04/06/2014 0415   PROT 5.9* 04/06/2014 0415   ALBUMIN 2.5* 04/06/2014 0415   AST 145* 04/06/2014 0415   ALT 289* 04/06/2014 0415   ALKPHOS 198* 04/06/2014 0415   BILITOT 0.4 04/06/2014 0415   GFRNONAA 5* 04/06/2014 0415   GFRAA 6* 04/06/2014 0415       Time In Time Out Total Time Spent with Patient Total Overall Time  1015 1130 70 min 75 min    Greater than 50%  of this time was spent counseling and coordinating care related to the above assessment and plan.   Lorinda Creed NP  Palliative Medicine Team Team Phone # [phone] Pager [phone]  Discussed with Dr Vassie Loll

## 2014-04-06 NOTE — Progress Notes (Signed)
Subjective: Interval History: has no complaint, on vent, sedated but moves around.  Objective: Vital signs in last 24 hours: Temp:  [97.2 F (36.2 C)-97.9 F (36.6 C)] 97.7 F (36.5 C) (05/28 0340) Pulse Rate:  [56-66] 57 (05/28 0630) Resp:  [9-17] 14 (05/28 0630) BP: (103-186)/(48-83) 103/48 mmHg (05/28 0630) SpO2:  [98 %-100 %] 100 % (05/28 0630) FiO2 (%):  [40 %-100 %] 40 % (05/28 0412) Weight:  [63.1 kg (139 lb 1.8 oz)-63.6 kg (140 lb 3.4 oz)] 63.6 kg (140 lb 3.4 oz) (05/28 0600) Weight change: -1.6 kg (-3 lb 8.4 oz)  Intake/Output from previous day: 05/27 0701 - 05/28 0700 In: 1297.6 [I.V.:480.9; NG/GT:716.7; IV Piggyback:100] Out: 1000  Intake/Output this shift:    General appearance: pale and sedated on vent Resp: rales bibasilar Cardio: S1, S2 normal and systolic murmur: holosystolic 2/6, blowing at apex GI: pos bs, liver down 5 cm Extremities: edema TR, AVF LUA B&T  Lab Results:  Recent Labs  04/05/14 0500 04/06/14 0415  WBC 4.2 5.5  HGB 8.4* 8.8*  HCT 25.4* 26.9*  PLT 187 177   BMET:  Recent Labs  04/05/14 0730 04/06/14 0415  NA 140 138  K 4.4 4.6  CL 97 96  CO2 24 22  GLUCOSE 107* 137*  BUN 77* 104*  CREATININE 6.67* 9.08*  CALCIUM 9.1 8.6   No results found for this basename: PTH,  in the last 72 hours Iron Studies: No results found for this basename: IRON, TIBC, TRANSFERRIN, FERRITIN,  in the last 72 hours  Studies/Results: Dg Chest Port 1 View  04/05/2014   CLINICAL DATA:  Endotracheal tube positioning.  EXAM: PORTABLE CHEST - 1 VIEW  COMPARISON:  Chest radiograph performed earlier today at 1:52 a.m.  FINDINGS: The patient's endotracheal tube is seen ending 2-3 cm above the carina. An enteric tube is noted extending below the diaphragm.  There is mild elevation of the right hemidiaphragm. Vascular congestion is noted. Mild bilateral atelectasis is seen. No pleural effusion or pneumothorax is identified, though the left costophrenic angle is  incompletely imaged on this study.  The cardiomediastinal silhouette is mildly enlarged. No acute osseous abnormalities are identified. External pacing pads are seen.  IMPRESSION: 1. Endotracheal tube seen ending 2 3 cm above the carina. 2. Mild elevation of the right hemidiaphragm. Vascular congestion and mild cardiomegaly noted. Mild bilateral atelectasis seen.   Electronically Signed   By: Roanna Raider M.D.   On: 04/05/2014 04:48   Dg Chest Port 1 View  04/05/2014   CLINICAL DATA:  Crackles, shortness of breath, history cardiomyopathy, CHF, diabetes, hypertension, coronary artery disease, end-stage renal disease  EXAM: PORTABLE CHEST - 1 VIEW  COMPARISON:  Portable exam 0152 hr compared to 03/14/2014  FINDINGS: Enlargement of cardiac silhouette with pulmonary vascular congestion.  Atherosclerotic calcification aorta.  Mediastinal contours otherwise normal.  Minimal RIGHT base atelectasis.  No acute infiltrate, pleural effusion or pneumothorax.  Coronary arterial stent noted projecting over LEFT heart.  Bones unremarkable.  IMPRESSION: Enlargement of cardiac silhouette with pulmonary vascular congestion.  Minimal RIGHT base atelectasis.   Electronically Signed   By: Ulyses Southward M.D.   On: 04/05/2014 02:32    I have reviewed the patient's current medications.  Assessment/Plan: 1 CRF vol xs but low bp, need to stop meds.  For Hd 2 VDRF hopefully get off after HD 3 Anemia start epo , check Fe 4 HPTH 5 DM fair control 6 Nutrition on TF 7 Nonadherence ?? If to  cont HD 8 CAD 9 Dementia P HD, wean, stop hydral, coreg, CM control     LOS: 1 day   Gilliam Hawkes L Argelio Granier 04/06/2014,7:21 AM

## 2014-04-07 DIAGNOSIS — F0390 Unspecified dementia without behavioral disturbance: Secondary | ICD-10-CM

## 2014-04-07 LAB — GLUCOSE, CAPILLARY
GLUCOSE-CAPILLARY: 204 mg/dL — AB (ref 70–99)
GLUCOSE-CAPILLARY: 85 mg/dL (ref 70–99)
GLUCOSE-CAPILLARY: 99 mg/dL (ref 70–99)
Glucose-Capillary: 77 mg/dL (ref 70–99)
Glucose-Capillary: 74 mg/dL (ref 70–99)
Glucose-Capillary: 160 mg/dL — ABNORMAL HIGH (ref 70–99)
Glucose-Capillary: 146 mg/dL — ABNORMAL HIGH (ref 70–99)

## 2014-04-07 LAB — CBC
HEMATOCRIT: 28.4 % — AB (ref 39.0–52.0)
HEMOGLOBIN: 9.2 g/dL — AB (ref 13.0–17.0)
MCH: 33.5 pg (ref 26.0–34.0)
MCHC: 32.4 g/dL (ref 30.0–36.0)
MCV: 103.3 fL — ABNORMAL HIGH (ref 78.0–100.0)
Platelets: 188 10*3/uL (ref 150–400)
RBC: 2.75 MIL/uL — AB (ref 4.22–5.81)
RDW: 14.8 % (ref 11.5–15.5)
WBC: 4.7 10*3/uL (ref 4.0–10.5)

## 2014-04-07 LAB — RENAL FUNCTION PANEL
Albumin: 2.5 g/dL — ABNORMAL LOW (ref 3.5–5.2)
BUN: 38 mg/dL — ABNORMAL HIGH (ref 6–23)
CO2: 29 meq/L (ref 19–32)
Calcium: 7.5 mg/dL — ABNORMAL LOW (ref 8.4–10.5)
Chloride: 97 mEq/L (ref 96–112)
Creatinine, Ser: 5.13 mg/dL — ABNORMAL HIGH (ref 0.50–1.35)
GFR calc Af Amer: 12 mL/min — ABNORMAL LOW (ref >90)
GFR calc non Af Amer: 10 mL/min — ABNORMAL LOW (ref >90)
GLUCOSE: 80 mg/dL (ref 70–99)
PHOSPHORUS: 5.7 mg/dL — AB (ref 2.3–4.6)
POTASSIUM: 3.9 meq/L (ref 3.7–5.3)
Sodium: 139 mEq/L (ref 137–147)

## 2014-04-07 LAB — PROCALCITONIN: Procalcitonin: 4.63 ng/mL

## 2014-04-07 MED ORDER — LIDOCAINE-PRILOCAINE 2.5-2.5 % EX CREA: 1.0000 "application " | TOPICAL_CREAM | CUTANEOUS | Status: DC | PRN

## 2014-04-07 MED ORDER — ISOSORBIDE MONONITRATE ER 60 MG PO TB24
60.0000 mg | ORAL_TABLET | Freq: Every day | ORAL | Status: DC
  Administered 2014-04-08 – 2014-04-11 (×3): 60 mg via ORAL
  Filled 2014-04-07 (×4): qty 1

## 2014-04-07 MED ORDER — HEPARIN SODIUM (PORCINE) 1000 UNIT/ML DIALYSIS: 1000.0000 [IU] | INTRAMUSCULAR | Status: DC | PRN

## 2014-04-07 MED ORDER — NEPRO/CARBSTEADY PO LIQD: 237.0000 mL | ORAL | Status: DC | PRN

## 2014-04-07 MED ORDER — HEPARIN SODIUM (PORCINE) 1000 UNIT/ML DIALYSIS: 100.0000 [IU]/kg | INTRAMUSCULAR | Status: DC | PRN

## 2014-04-07 MED ORDER — RENA-VITE PO TABS
1.0000 | ORAL_TABLET | Freq: Every day | ORAL | Status: DC
  Administered 2014-04-07 – 2014-04-09 (×2): 1 via ORAL
  Filled 2014-04-07 (×5): qty 1

## 2014-04-07 MED ORDER — LIDOCAINE HCL (PF) 1 % IJ SOLN: 5.0000 mL | INTRAMUSCULAR | Status: DC | PRN

## 2014-04-07 MED ORDER — NEPRO/CARBSTEADY PO LIQD
237.0000 mL | Freq: Two times a day (BID) | ORAL | Status: DC
Start: 2014-04-07 — End: 2014-04-11
  Administered 2014-04-07 – 2014-04-10 (×3): 237 mL via ORAL
  Filled 2014-04-07 (×3): qty 237

## 2014-04-07 MED ORDER — ALTEPLASE 2 MG IJ SOLR: 2.0000 mg | Freq: Once | INTRAMUSCULAR | Status: DC | PRN

## 2014-04-07 MED ORDER — ALTEPLASE 2 MG IJ SOLR
2.0000 mg | Freq: Once | INTRAMUSCULAR | Status: AC | PRN
  Filled 2014-04-07: qty 2

## 2014-04-07 MED ORDER — PENTAFLUOROPROP-TETRAFLUOROETH EX AERO: 1.0000 "application " | INHALATION_SPRAY | CUTANEOUS | Status: DC | PRN

## 2014-04-07 MED ORDER — SODIUM CHLORIDE 0.9 % IV SOLN: 100.0000 mL | INTRAVENOUS | Status: DC | PRN

## 2014-04-07 MED ORDER — PANTOPRAZOLE SODIUM 40 MG PO TBEC
40.0000 mg | DELAYED_RELEASE_TABLET | Freq: Every day | ORAL | Status: DC
  Administered 2014-04-07: 40 mg via ORAL
  Filled 2014-04-07: qty 1

## 2014-04-07 NOTE — Progress Notes (Signed)
   Left phone message with son Orvilla Fus for f/u support.  Encouraged to call with questions or concerns.  Reminded of Sunday's scheduled meeting with Dr Ladona Ridgel for continued GOC discussion  Lorinda Creed NP  Palliative Medicine Team Team Phone # [phone] Pager [phone]

## 2014-04-07 NOTE — Progress Notes (Addendum)
PULMONARY / CRITICAL CARE MEDICINE   Name: Albert Grant MRN: [id number] DOB: 09/01/1941    ADMISSION DATE:  04/05/2014 CONSULTATION DATE:  04/05/2014  REFERRING MD :  EDP PRIMARY SERVICE: PCCM  CHIEF COMPLAINT:  Hyperkalemia  BRIEF PATIENT DESCRIPTION: 73 year old [place] male, ESRD on HD, Advanced dementia. Missed dialysis 5/26. [facility], brought to [place]. Found to be hyperkalemic in need of urgent dialysis. PCCM to admit.   PMH -ESRD on HD, DM, CAD, and CHF (LVEF reportedly 45%). Resides in SNF due to severe dementia. Two recent hospitalizations including [facility] for pseudomonas bacteremia, and [facility] dialysis [facility], discharged 03/20/2014.  SIGNIFICANT EVENTS / STUDIES:  3/9 - LUE AV fistula placed.  5/27 - admitted for emergent HD  LINES / TUBES: OETT 5/27 >>>5/28 R fem CVL 5/27 >>>  CULTURES: None  ANTIBIOTICS: Zosyn 5/27 >>>   SUBJECTIVE: afebrile Sitting up in bed Oriented x 2, denies pain  VITAL SIGNS: Temp:  [97 F (36.1 C)-98.5 F (36.9 C)] 97 F (36.1 C) (05/29 1200) Pulse Rate:  [58-78] 78 (05/29 1400) Resp:  [10-17] 15 (05/29 1400) BP: (112-185)/(44-91) 141/91 mmHg (05/29 1400) SpO2:  [94 %-100 %] 94 % (05/29 1400) Weight:  [60.1 kg (132 lb 7.9 oz)-61.1 kg (134 lb 11.2 oz)] 60.1 kg (132 lb 7.9 oz) (05/29 0600) HEMODYNAMICS:   VENTILATOR SETTINGS:   INTAKE / OUTPUT: Intake/Output     05/28 0701 - 05/29 0700 05/29 0701 - 05/30 0700   I.V. (mL/kg) 240.8 (4)    NG/GT 155    IV Piggyback 150    Total Intake(mL/kg) 545.8 (9.1)    Other 3500    Total Output 3500     Net -2954.2          Stool Occurrence  4 x     PHYSICAL EXAMINATION: General: awake , interactive elderly male, speaks english Neuro: non focal, interactive, oriented x 2, could read clock, date off HEENT:  [place]/AT, no JVD noted Cardiovascular:  Huston FoleyBrady, regular Lungs:  clear Abdomen:  Soft, non-distended Musculoskeletal:   No acute deformity, moves all extremities  Skin:  Intact  LABS:  CBC  Recent Labs Lab 04/05/14 0500 04/06/14 0415 04/07/14 0355  WBC 4.2 5.5 4.7  HGB 8.4* 8.8* 9.2*  HCT 25.4* 26.9* 28.4*  PLT 187 177 188   Coag's No results found for this basename: APTT, INR,  in the last 168 hours BMET  Recent Labs Lab 04/05/14 0730 04/06/14 0415 04/07/14 0355  NA 140 138 139  K 4.4 4.6 3.9  CL 97 96 97  CO2 24 22 29   BUN 77* 104* 38*  CREATININE 6.67* 9.08* 5.13*  GLUCOSE 107* 137* 80   Electrolytes  Recent Labs Lab 04/05/14 0344 04/05/14 0730  04/06/14 0415 04/06/14 1110 04/07/14 0355  CALCIUM 10.3 9.1  --  8.6  --  7.5*  MG 4.4*  --   --   --   --   --   PHOS 15.5*  --   < > 10.4* 11.3* 5.7*  < > = values in this interval not displayed. Sepsis Markers  Recent Labs Lab 04/05/14 0215 04/05/14 0424 04/05/14 0643 04/06/14 0415 04/07/14 0355  LATICACIDVEN 1.67 2.2  --   --   --   PROCALCITON  --   --  3.94 4.40 4.63   ABG No results found for this basename: PHART, PCO2ART, PO2ART,  in the last 168 hours  Liver Enzymes  Recent Labs Lab 04/05/14 0206 04/06/14 0415 04/07/14 0355  AST 452* 145*  --   ALT 458* 289*  --   ALKPHOS 257* 198*  --   BILITOT 0.7 0.4  --   ALBUMIN 3.1* 2.5* 2.5*   Cardiac Enzymes  Recent Labs Lab 04/05/14 0424 04/05/14 0931 04/05/14 1500  TROPONINI <0.30 <0.30 <0.30   Glucose  Recent Labs Lab 04/06/14 1739 04/06/14 1930 04/07/14 0026 04/07/14 0355 04/07/14 0832 04/07/14 1131  GLUCAP 97 70 99 77 74 85    Imaging No results found.  ASSESSMENT / PLAN:  PULMONARY A: Acute Respiratory Failure intubated for airway protection in setting of AMS and vomiting Likely Aspiration PNA  P:   VAP prevention per protocol   CARDIOVASCULAR A:  Bradycardia -  In setting of hyperkalemia, atropine x 1 in ED Elevated troponin in setting of ESRD, possibly demand ischemia from bradycardia.  H/o HTN, CHF does not appear  volume overloaded  P:  Resume home lipitor, coreg,  plavix, hydralazine, imdur Hold clonidine   RENAL A:   ESRD on HD Hyperkalemia (Insulin, calcium chloride given x2 in ED) -Emergent HD  P:   Nephrology following   GASTROINTESTINAL A:   GERD Elevated LFT, r/o congestive hepatopathy - improving P:   Protonix BID (home dose) Follow LFT May need acute hep panel  HEMATOLOGIC A:   Anemia of chronic illness  P:  sub q hep  INFECTIOUS A:   Probable Aspiration PNA Leukopenia with left shift Recent pseudomonas bacteremia (not sure how recent)  P:   ABX and cultures as above PCt trending up - limit abx once down  Empiric zosyn , dc since improved clinical status  ENDOCRINE A:   DM  P:   CBG and SSI  NEUROLOGIC A:   Acute on chronic encephalopathy Dementia appears severe at baseline Depression  P:   Resume  home zoloft, remeron Haldol prn    GLOBAL: Full code from SNF. Was seen by palliative care last admission but they were unable to communicate effectively with family, who missed their meeting with palliative care. Needs a GOC discussion since he keeps missing HD- reinvolved palliative care . Spoke to son - pt refuses rx when by himself,  May need to obtain legal guardianship to avoid readmissions, needs comprehensive goals of care discussion including HD, he is extubatable   I have personally obtained a history, examined the patient, evaluated laboratory and imaging results, formulated the assessment and plan and placed orders.   Cyril Mourning MD. Tonny Bollman.  Pulmonary & Critical care Pager [phone] If no response call [phone]

## 2014-04-07 NOTE — Progress Notes (Signed)
Subjective: Interval History: has no complaint,"ok".  Objective: Vital signs in last 24 hours: Temp:  [97.5 F (36.4 C)-98.5 F (36.9 C)] 98.5 F (36.9 C) (05/29 0356) Pulse Rate:  [55-72] 72 (05/29 0600) Resp:  [10-17] 14 (05/29 0600) BP: (101-170)/(42-98) 154/58 mmHg (05/29 0600) SpO2:  [94 %-100 %] 95 % (05/29 0600) FiO2 (%):  [40 %] 40 % (05/28 0940) Weight:  [60.1 kg (132 lb 7.9 oz)-63.6 kg (140 lb 3.4 oz)] 60.1 kg (132 lb 7.9 oz) (05/29 0600) Weight change: 0.5 kg (1 lb 1.6 oz)  Intake/Output from previous day: 05/28 0701 - 05/29 0700 In: 545.8 [I.V.:240.8; NG/GT:155; IV Piggyback:150] Out: 3500  Intake/Output this shift:    General appearance: alert and uncooperative  Lab Results:  Recent Labs  04/06/14 0415 04/07/14 0355  WBC 5.5 4.7  HGB 8.8* 9.2*  HCT 26.9* 28.4*  PLT 177 188   BMET:  Recent Labs  04/06/14 0415 04/07/14 0355  NA 138 139  K 4.6 3.9  CL 96 97  CO2 22 29  GLUCOSE 137* 80  BUN 104* 38*  CREATININE 9.08* 5.13*  CALCIUM 8.6 7.5*   No results found for this basename: PTH,  in the last 72 hours Iron Studies:  Recent Labs  04/06/14 0834  IRON 24*  TIBC 170*    Studies/Results: No results found.  I have reviewed the patient's current medications.  Assessment/Plan: 1  CRf solute better with HD.  Vol ok.  Will do TTS 2 HTN avoid antiHTN as is controlled with HD 3 DM controlled 4 Dementia 5 Nonadherence 6 Anemia stable 7 CAD 8 Resp failure ok now after HD 9 Language barrior P HD,  Palliative Care , not appropriate to him or system to cont emergent rescue if he does not want to participate in tiw HD.  Cont epo, diet    LOS: 2 days   Katryn Plummer L Jinelle Butchko 04/07/2014,7:16 AM

## 2014-04-07 NOTE — Progress Notes (Signed)
NUTRITION FOLLOW UP  DOCUMENTATION CODES  Per approved criteria   -Non-severe (moderate) malnutrition in the context of chronic illness    Pt meets criteria for non-severe (moderate) MALNUTRITION in the context of chronic illness as evidenced by 13% weight loss in 3 months with mild depletion of muscle mass.  Intervention:    Nepro Shake po BID, each supplement provides 425 kcal and 19 grams protein  Nutrition Dx:   Inadequate oral intake related to inability to eat as evidenced by NPO status. Ongoing. Diet just advanced.  Goal:   Intake to meet >90% of estimated nutrition needs. Unmet, progressing.  Monitor:   PO intake, labs, weight trend.  Assessment:   73 year old [place] male, ESRD on HD, Advanced dementia. Missed dialysis 5/26. [facility], brought to . Found to be hyperkalemic in need of urgent dialysis. Two recent hospitalizations including [facility] for pseudomonas bacteremia, and [facility] for [facility] several dialysis [facility], discharged 03/20/2014.  Patient was extubated on 5/28. Diet has been advanced to regular. Patient with moderate PCM. Doubt intake will be great.  Height: Ht Readings from Last 1 Encounters:  04/05/14 5\' 5"  (1.651 m)    Weight Status:   Wt Readings from Last 1 Encounters:  04/07/14 132 lb 7.9 oz (60.1 kg)  04/05/14  139 lb 1.8 oz (63.1 kg)    Body mass index is 22.05 kg/(m^2).   Re-estimated needs:  Kcal: 1800-2100 Protein: 72-84 gm Fluid: 1.2 L  Skin: no wounds  Diet Order: General   Intake/Output Summary (Last 24 hours) at 04/07/14 1542 Last data filed at 04/07/14 0600  Gross per 24 hour  Intake    290 ml  Output   3500 ml  Net  -3210 ml    Last BM: 5/29   Labs:   Recent Labs Lab 04/05/14 0344 04/05/14 0730  04/06/14 0415 04/06/14 1110 04/07/14 0355  NA 137 140  --  138  --  139  K 7.5* 4.4  --  4.6  --  3.9  CL 95* 97  --  96  --  97  CO2 15* 24  --  22  --  29  BUN  157* 77*  --  104*  --  38*  CREATININE 12.94* 6.67*  --  9.08*  --  5.13*  CALCIUM 10.3 9.1  --  8.6  --  7.5*  MG 4.4*  --   --   --   --   --   PHOS 15.5*  --   < > 10.4* 11.3* 5.7*  GLUCOSE 256* 107*  --  137*  --  80  < > = values in this interval not displayed.  CBG (last 3)   Recent Labs  04/07/14 0355 04/07/14 0832 04/07/14 1131  GLUCAP 77 74 85    Scheduled Meds: . antiseptic oral rinse  15 mL Mouth Rinse q12n4p  . aspirin  81 mg Oral Daily  . atorvastatin  80 mg Oral q1800  . chlorhexidine  15 mL Mouth Rinse BID  . Chlorhexidine Gluconate Cloth  6 each Topical Daily  . clopidogrel  75 mg Oral Daily  . darbepoetin (ARANESP) injection - DIALYSIS  200 mcg Intravenous Q Thu-HD  . heparin  5,000 Units Subcutaneous 3 times per day  . insulin aspart  2-6 Units Subcutaneous 6 times per day  . mirtazapine  15 mg Oral QHS  . multivitamin  1 tablet Oral QHS  .  mupirocin ointment  1 application Nasal BID  . sertraline  100 mg Oral Daily  . sevelamer carbonate  2.4 g Per Tube TID    Continuous Infusions: . sodium chloride 10 mL/hr at 04/06/14 2000    Joaquin Courts, RD, LDN, CNSC Pager [phone] After Hours Pager [phone]

## 2014-04-08 LAB — CBC
HCT: 28.1 % — ABNORMAL LOW (ref 39.0–52.0)
Hemoglobin: 9.2 g/dL — ABNORMAL LOW (ref 13.0–17.0)
MCH: 33.5 pg (ref 26.0–34.0)
MCHC: 32.7 g/dL (ref 30.0–36.0)
MCV: 102.2 fL — ABNORMAL HIGH (ref 78.0–100.0)
PLATELETS: 191 10*3/uL (ref 150–400)
RBC: 2.75 MIL/uL — ABNORMAL LOW (ref 4.22–5.81)
RDW: 14.3 % (ref 11.5–15.5)
WBC: 5.9 10*3/uL (ref 4.0–10.5)

## 2014-04-08 LAB — RENAL FUNCTION PANEL
ALBUMIN: 2.6 g/dL — AB (ref 3.5–5.2)
BUN: 32 mg/dL — ABNORMAL HIGH (ref 6–23)
CHLORIDE: 99 meq/L (ref 96–112)
CO2: 27 mEq/L (ref 19–32)
Calcium: 7.2 mg/dL — ABNORMAL LOW (ref 8.4–10.5)
Creatinine, Ser: 4.52 mg/dL — ABNORMAL HIGH (ref 0.50–1.35)
GFR, EST AFRICAN AMERICAN: 13 mL/min — AB (ref >90)
GFR, EST NON AFRICAN AMERICAN: 12 mL/min — AB (ref >90)
Glucose, Bld: 214 mg/dL — ABNORMAL HIGH (ref 70–99)
POTASSIUM: 3.7 meq/L (ref 3.7–5.3)
Phosphorus: 4.3 mg/dL (ref 2.3–4.6)
SODIUM: 141 meq/L (ref 137–147)

## 2014-04-08 LAB — GLUCOSE, CAPILLARY
GLUCOSE-CAPILLARY: 149 mg/dL — AB (ref 70–99)
GLUCOSE-CAPILLARY: 195 mg/dL — AB (ref 70–99)
GLUCOSE-CAPILLARY: 64 mg/dL — AB (ref 70–99)
Glucose-Capillary: 117 mg/dL — ABNORMAL HIGH (ref 70–99)

## 2014-04-08 MED ORDER — HYDRALAZINE HCL 50 MG PO TABS: 75.0000 mg | ORAL_TABLET | Freq: Three times a day (TID) | ORAL | Status: DC

## 2014-04-08 MED ORDER — CARVEDILOL 25 MG PO TABS
25.0000 mg | ORAL_TABLET | Freq: Two times a day (BID) | ORAL | Status: DC
  Administered 2014-04-09: 25 mg via ORAL
  Filled 2014-04-08 (×4): qty 1

## 2014-04-08 MED ORDER — INSULIN ASPART 100 UNIT/ML ~~LOC~~ SOLN
0.0000 [IU] | Freq: Every day | SUBCUTANEOUS | Status: DC
  Administered 2014-04-09: 2 [IU] via SUBCUTANEOUS

## 2014-04-08 MED ORDER — INSULIN ASPART 100 UNIT/ML ~~LOC~~ SOLN
0.0000 [IU] | Freq: Three times a day (TID) | SUBCUTANEOUS | Status: DC
  Administered 2014-04-09 – 2014-04-10 (×4): 2 [IU] via SUBCUTANEOUS
  Administered 2014-04-10: 3 [IU] via SUBCUTANEOUS
  Administered 2014-04-11: 9 [IU] via SUBCUTANEOUS
  Administered 2014-04-11: 1 [IU] via SUBCUTANEOUS

## 2014-04-08 MED ORDER — CARVEDILOL 25 MG PO TABS: 25.0000 mg | ORAL_TABLET | Freq: Two times a day (BID) | ORAL | Status: DC

## 2014-04-08 MED ORDER — HYDRALAZINE HCL 20 MG/ML IJ SOLN: 10.0000 mg | Freq: Four times a day (QID) | INTRAMUSCULAR | Status: DC | PRN

## 2014-04-08 MED ORDER — HYDRALAZINE HCL 50 MG PO TABS
75.0000 mg | ORAL_TABLET | Freq: Three times a day (TID) | ORAL | Status: DC
  Filled 2014-04-08 (×4): qty 1

## 2014-04-08 MED ORDER — CLONIDINE HCL ER 0.1 MG PO TB12
0.1000 mg | ORAL_TABLET | Freq: Once | ORAL | Status: AC
  Administered 2014-04-08: 0.1 mg via ORAL
  Filled 2014-04-08: qty 1

## 2014-04-08 NOTE — Progress Notes (Signed)
ANTIBIOTIC CONSULT NOTE - follow-up  Pharmacy Consult for Zosyn Indication: Aspiration PNA  Allergies  Allergen Reactions  . Lisinopril Other (See Comments)    [facility]   [facility] Measurements: Height: 5\' 5"  (165.1 cm) Weight: 129 lb 10.1 oz (58.8 kg) IBW/kg (Calculated) : 61.5 Weight:  67.1 kg  Vital Signs: Temp: 97.5 F (36.4 C) (05/30 1328) Temp src: Oral (05/30 1328) BP: 201/78 mmHg (05/30 1412) Pulse Rate: 77 (05/30 1412) Intake/Output from previous day: 05/29 0701 - 05/30 0700 In: 580 [P.O.:580] Out: -  Intake/Output from this shift: Total I/O In: 120 [P.O.:120] Out: 3500 [Other:3500]  Labs:  Recent Labs  04/06/14 0415 04/07/14 0355 04/08/14 1028 04/08/14 1033  WBC 5.5 4.7 5.9  --   HGB 8.8* 9.2* 9.2*  --   PLT 177 188 191  --   CREATININE 9.08* 5.13*  --  4.52*    Microbiology: Recent Results (from the past 720 hour(s))  CULTURE, BLOOD (ROUTINE X 2)     Status: None   Collection Time    03/14/14  8:48 PM      Result Value Ref Range Status   Specimen Description BLOOD EJ   Final   Special Requests BOTTLES DRAWN AEROBIC AND ANAEROBIC 10CC EACH   Final   Culture  Setup Time     Final   Value: 03/15/2014 01:43     Performed at [facility]   Culture     Final   Value: NO GROWTH 5 DAYS     Performed at [facility]   Report Status 03/21/2014 FINAL   Final  CULTURE, BLOOD (ROUTINE X 2)     Status: None   Collection Time    03/14/14  8:53 PM      Result Value Ref Range Status   Specimen Description BLOOD ARM RIGHT   Final   Special Requests BOTTLES DRAWN AEROBIC AND ANAEROBIC 10CC   Final   Culture  Setup Time     Final   Value: 03/15/2014 01:43     Performed at [facility]   Culture     Final   Value: STAPHYLOCOCCUS SPECIES (COAGULASE NEGATIVE)     Note: THE SIGNIFICANCE OF ISOLATING THIS ORGANISM FROM A SINGLE SET OF BLOOD CULTURES WHEN MULTIPLE SETS ARE DRAWN IS UNCERTAIN. PLEASE NOTIFY THE MICROBIOLOGY DEPARTMENT WITHIN  ONE WEEK IF SPECIATION AND SENSITIVITIES ARE REQUIRED.     Note: Gram Stain Report Called to,Read Back By and Verified With: EMMANUEL CASTRO ON 03/15/2014 AT 8:20P BY Serafina MitchellWILEJ     Performed at [facility]   Report Status 03/18/2014 FINAL   Final  URINE CULTURE     Status: None   Collection Time    03/14/14  9:11 PM      Result Value Ref Range Status   Specimen Description URINE, CATHETERIZED   Final   Special Requests NONE   Final   Culture  Setup Time     Final   Value: 03/15/2014 04:38     Performed at [facility]   Colony Count     Final   Value: NO GROWTH     Performed at [facility]   Culture     Final   Value: NO GROWTH     Performed at [facility]   Report Status 03/16/2014 FINAL   Final  MRSA PCR SCREENING     Status: None   Collection Time    03/15/14  8:18 AM  Result Value Ref Range Status   MRSA by PCR NEGATIVE  NEGATIVE Final   Comment:            The GeneXpert MRSA Assay (FDA     approved for NASAL specimens     only), is one component of a     comprehensive MRSA colonization     surveillance program. It is not     intended to diagnose MRSA     infection nor to guide or     monitor treatment for     MRSA infections.  URINE CULTURE     Status: None   Collection Time    04/05/14  2:30 AM      Result Value Ref Range Status   Specimen Description URINE, CATHETERIZED   Final   Special Requests NONE   Final   Culture  Setup Time     Final   Value: 04/05/2014 03:23     Performed at [facility]   Colony Count     Final   Value: NO GROWTH     Performed at [facility]   Culture     Final   Value: NO GROWTH     Performed at [facility]   Report Status 04/06/2014 FINAL   Final  CULTURE, BLOOD (ROUTINE X 2)     Status: None   Collection Time    04/05/14  3:56 AM      Result Value Ref Range Status   Specimen Description BLOOD RIGHT ANTECUBITAL   Final   Special Requests BOTTLES DRAWN AEROBIC AND  ANAEROBIC [facility] EACH   Final   Culture  Setup Time     Final   Value: 04/05/2014 08:34     Performed at [facility]   Culture     Final   Value:        BLOOD CULTURE RECEIVED NO GROWTH TO DATE CULTURE WILL BE HELD FOR 5 DAYS BEFORE ISSUING A FINAL NEGATIVE REPORT     Performed at [facility]   Report Status PENDING   Incomplete  CULTURE, BLOOD (ROUTINE X 2)     Status: None   Collection Time    04/05/14  4:23 AM      Result Value Ref Range Status   Specimen Description BLOOD RIGHT WRIST   Final   Special Requests     Final   Value: BOTTLES DRAWN AEROBIC AND ANAEROBIC 5CC AER 3CC ANA   Culture  Setup Time     Final   Value: 04/05/2014 08:34     Performed at [facility]   Culture     Final   Value:        BLOOD CULTURE RECEIVED NO GROWTH TO DATE CULTURE WILL BE HELD FOR 5 DAYS BEFORE ISSUING A FINAL NEGATIVE REPORT     Performed at [facility]   Report Status PENDING   Incomplete  MRSA PCR SCREENING     Status: Abnormal   Collection Time    04/05/14  4:57 AM      Result Value Ref Range Status   MRSA by PCR POSITIVE (*) NEGATIVE Final   Comment:            The GeneXpert MRSA Assay (FDA     approved for NASAL specimens     only), is one component of a     comprehensive MRSA colonization     surveillance program. It is not  intended to diagnose MRSA     infection nor to guide or     monitor treatment for     MRSA infections.     RESULT CALLED TO, READ BACK BY AND VERIFIED WITH:     C.PORTER RN 0630 04/05/14 E.GADDY  URINE CULTURE     Status: None   Collection Time    04/05/14  5:09 AM      Result Value Ref Range Status   Specimen Description URINE, CATHETERIZED   Final   Special Requests NONE   Final   Culture  Setup Time     Final   Value: 04/05/2014 05:29     Performed at [facility]   Colony Count     Final   Value: NO GROWTH     Performed at [facility]   Culture     Final   Value: NO GROWTH     Performed at  [facility]   Report Status 04/06/2014 FINAL   Final   Medical History: Past Medical History  Diagnosis Date  . 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 Hypertension   . CAD (coronary artery disease)      with angioplasty  . 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 Cardiomyopathy     with ejection fraction of 45 %  . Heart failure, systolic and diastolic   . Anemia   . CHF (congestive heart failure)    Medications:  Anti-infectives   Start     Dose/Rate Route Frequency Ordered Stop   04/05/14 0600  piperacillin-tazobactam (ZOSYN) IVPB 2.25 g  Status:  Discontinued     2.25 g 100 mL/hr over 30 Minutes Intravenous 3 times per day 04/05/14 0446 04/07/14 1435     Assessment: 72yo male with multiple medical problems including the possibility of aspiration PNA on Zosyn.  He is afebrile and WBC is now WNL.  Patient with HD today.  Zosyn dose remains appropriate.  Goal of Therapy:  Therapeutic response to IV antibiotics  Plan:  1.  Zosyn 2.25 gm IV every 8 hours 2.  F/U clinical response, culture data and LOT  Thank you, Piedad Climes, PharmD Clinical Pharmacist - Resident Pager: [phone] Pharmacy: [phone] 04/08/2014 4:16 PM

## 2014-04-08 NOTE — Procedures (Signed)
I was present at this session.  I have reviewed the session itself and made appropriate changes.  HD via LUA avf tol vol off . olc green.   Albert Grant 5/30/201511:18 AM

## 2014-04-08 NOTE — Progress Notes (Signed)
Subjective: Interval History: has no complaint .  Objective: Vital signs in last 24 hours: Temp:  [97 F (36.1 C)-98.3 F (36.8 C)] 98.1 F (36.7 C) (05/30 0915) Pulse Rate:  [66-78] 75 (05/30 1030) Resp:  [10-17] 16 (05/30 0932) BP: (141-185)/(53-91) 177/80 mmHg (05/30 1030) SpO2:  [92 %-98 %] 98 % (05/30 0915) Weight:  [61.825 kg (136 lb 4.8 oz)-62.6 kg (138 lb 0.1 oz)] 62.6 kg (138 lb 0.1 oz) (05/30 0915) Weight change: -1.775 kg (-3 lb 14.6 oz)  Intake/Output from previous day: 05/29 0701 - 05/30 0700 In: 580 [P.O.:580] Out: -  Intake/Output this shift: Total I/O In: 120 [P.O.:120] Out: -   General appearance: cooperative and slowed mentation Resp: diminished breath sounds bilaterally and rales bibasilar Cardio: regular rate and rhythm, S1, S2 normal and systolic murmur: holosystolic 2/6, blowing at apex GI: pos bs, liver down 5 cm, soft Extremities: AVF B&T  Lab Results:  Recent Labs  04/07/14 0355 04/08/14 1028  WBC 4.7 5.9  HGB 9.2* 9.2*  HCT 28.4* 28.1*  PLT 188 191   BMET:  Recent Labs  04/07/14 0355 04/08/14 1033  NA 139 141  K 3.9 3.7  CL 97 99  CO2 29 27  GLUCOSE 80 214*  BUN 38* 32*  CREATININE 5.13* 4.52*  CALCIUM 7.5* 7.2*   No results found for this basename: PTH,  in the last 72 hours Iron Studies:  Recent Labs  04/06/14 0834  IRON 24*  TIBC 170*    Studies/Results: No results found.  I have reviewed the patient's current medications.  Assessment/Plan: 1 ESRD on HD lower solute and vol 2 Nonadherence ? Desire to stop HD 3 Dementia 4 DM controlled 5 CAD P Pall care to meet with family, HD    LOS: 3 days   Rae Sutcliffe L Nada Godley 04/08/2014,11:19 AM

## 2014-04-08 NOTE — Progress Notes (Signed)
[facility] PROGRESS NOTE  Albert Grant ZOX:[id number] DOB: June 24, 1941 DOA: 04/05/2014 PCP: No PCP Per Patient Brief narrative 73 year old [place] male, ESRD on HD, Advanced dementia. Missed dialysis 5/26. Two recent hospitalizations including one at [facility] for pseudomonas bacteremia, and most recently at [facility] for missing several dialysis treatments, discharged 03/20/2014.  Pt had AMS and vomiting at [facility] 5/26, brought to [place]. Found to be hyperkalemic in need of urgent dialysis and was admitted to [facility].    Assessment/Plan: Active Problems:   Hyperkalemia -resolved, was treated on admission with Insulin, calcium chloride given x2 in ED, and Emergent HD  Probable Asp PNA -was treated with zosyn 5/27-5/29, dc'ed per CCM after clinical improvement  Acute respiratory failure with hypoxia -was intubated OETT 5/27 >>>5/28 and managed on vent per CCM  -clinically improved  Malnutrition of moderate degree -nutritional supplements  Bradycardia  - In setting of hyperkalemia  and was on BB & clonidine, was treated with atropine x 1 in ED  -resloved Elevated troponin  -initial trop in ED mildly elevated x1, and subsequent trops neg x3- in setting of ESRD, possibly demand ischemia from bradycardia.  -no further w/u recommended HTN, uncontrolled -resume hydralazine, and coreg. Will not resume clonidine given its additive effect with BB (Pt was brady on admission)  -continue imdur CHF  -does not appear volume overloaded Weakness generalized -consult PT/OT -await palliative meeting outcome  Acute on chronic encephalopathy/Dementia  - ?appears to have severe at baseline  -on prn haldol Depression -continue zoloft, remeron DM -continue  h/o recent Pseudomonas Bacteremia CAD -Continue plavix, statin, coreg resumed ESRD -dialysis per renal -await palliative care meeting, ?desire to stop dialysis, was noncompliant  Anemia of Chronic disease  hgb stable Elevated LFT, r/o congestive  hepatopathy  - improving DNR (do not resuscitate)  -await palliative meeting  Code Status: full Family Communication: none at bedside Disposition Plan:  Pending palliative meeting    Consultants:  Pt was on CCM service through 5/29  Renal  Procedures: LINES / TUBES:  OETT 5/27 >>>5/28  R fem CVL 5/27 >>> LUE AV fistula 3/9 Emergent HD 5/27  Antibiotics:  Zosyn 5/27>>   HPI/Subjective: Pt alert, confused  Objective: Filed Vitals:   04/08/14 1412  BP: 201/78  Pulse: 77  Temp:   Resp: 16    Intake/Output Summary (Last 24 hours) at 04/08/14 1700 Last data filed at 04/08/14 1328  Gross per 24 hour  Intake    360 ml  Output   3500 ml  Net  -3140 ml   Filed Weights   04/08/14 0405 04/08/14 0915 04/08/14 1328  Weight: 61.825 kg (136 lb 4.8 oz) 62.6 kg (138 lb 0.1 oz) 58.8 kg (129 lb 10.1 oz)    Exam:  General: alert & oriented x 2 In NAD Cardiovascular: RRR, nl S1 s2 Respiratory: CTAB Abdomen: soft +BS NT/ND, no masses palpable Extremities: No cyanosis and no edema     Data Reviewed: Basic Metabolic Panel:  Recent Labs Lab 04/05/14 0344 04/05/14 0730  04/05/14 2229 04/06/14 0415 04/06/14 1110 04/07/14 0355 04/08/14 1033  NA 137 140  --   --  138  --  139 141  K 7.5* 4.4  --   --  4.6  --  3.9 3.7  CL 95* 97  --   --  96  --  97 99  CO2 15* 24  --   --  22  --  29 27  GLUCOSE 256* 107*  --   --  137*  --  80 214*  BUN 157* 77*  --   --  104*  --  38* 32*  CREATININE 12.94* 6.67*  --   --  9.08*  --  5.13* 4.52*  CALCIUM 10.3 9.1  --   --  8.6  --  7.5* 7.2*  MG 4.4*  --   --   --   --   --   --   --   PHOS 15.5*  --   < > 9.5* 10.4* 11.3* 5.7* 4.3  < > = values in this interval not displayed. Liver Function Tests:  Recent Labs Lab 04/05/14 0206 04/06/14 0415 04/07/14 0355 04/08/14 1033  AST 452* 145*  --   --   ALT 458* 289*  --   --   ALKPHOS 257* 198*  --   --   BILITOT 0.7 0.4  --   --   PROT 6.8 5.9*  --   --   ALBUMIN 3.1*  2.5* 2.5* 2.6*   No results found for this basename: LIPASE, AMYLASE,  in the last 168 hours  Recent Labs Lab 04/05/14 0206  AMMONIA 33   CBC:  Recent Labs Lab 04/05/14 0206 04/05/14 0229 04/05/14 0500 04/06/14 0415 04/07/14 0355 04/08/14 1028  WBC 3.7*  --  4.2 5.5 4.7 5.9  NEUTROABS 3.4  --   --   --   --   --   HGB 8.9* 9.9* 8.4* 8.8* 9.2* 9.2*  HCT 28.2* 29.0* 25.4* 26.9* 28.4* 28.1*  MCV 103.3*  --  100.8* 102.3* 103.3* 102.2*  PLT 178  --  187 177 188 191   Cardiac Enzymes:  Recent Labs Lab 04/05/14 0424 04/05/14 0931 04/05/14 1500  TROPONINI <0.30 <0.30 <0.30   BNP (last 3 results) No results found for this basename: PROBNP,  in the last 8760 hours CBG:  Recent Labs Lab 04/07/14 1732 04/07/14 2035 04/08/14 0002 04/08/14 0404 04/08/14 0433  GLUCAP 146* 204* 195* 64* 117*    Recent Results (from the past 240 hour(s))  URINE CULTURE     Status: None   Collection Time    04/05/14  2:30 AM      Result Value Ref Range Status   Specimen Description URINE, CATHETERIZED   Final   Special Requests NONE   Final   Culture  Setup Time     Final   Value: 04/05/2014 03:23     Performed at [facility]   Colony Count     Final   Value: NO GROWTH     Performed at [facility]   Culture     Final   Value: NO GROWTH     Performed at [facility]   Report Status 04/06/2014 FINAL   Final  CULTURE, BLOOD (ROUTINE X 2)     Status: None   Collection Time    04/05/14  3:56 AM      Result Value Ref Range Status   Specimen Description BLOOD RIGHT ANTECUBITAL   Final   Special Requests BOTTLES DRAWN AEROBIC AND ANAEROBIC [facility]5CC EACH   Final   Culture  Setup Time     Final   Value: 04/05/2014 08:34     Performed at [facility]   Culture     Final   Value:        BLOOD CULTURE RECEIVED NO GROWTH TO DATE CULTURE WILL BE HELD FOR 5 DAYS BEFORE ISSUING A FINAL NEGATIVE REPORT  Performed at [facility]   Report Status  PENDING   Incomplete  CULTURE, BLOOD (ROUTINE X 2)     Status: None   Collection Time    04/05/14  4:23 AM      Result Value Ref Range Status   Specimen Description BLOOD RIGHT WRIST   Final   Special Requests     Final   Value: BOTTLES DRAWN AEROBIC AND ANAEROBIC 5CC AER 3CC ANA   Culture  Setup Time     Final   Value: 04/05/2014 08:34     Performed at [facility]   Culture     Final   Value:        BLOOD CULTURE RECEIVED NO GROWTH TO DATE CULTURE WILL BE HELD FOR 5 DAYS BEFORE ISSUING A FINAL NEGATIVE REPORT     Performed at [facility]   Report Status PENDING   Incomplete  MRSA PCR SCREENING     Status: Abnormal   Collection Time    04/05/14  4:57 AM      Result Value Ref Range Status   MRSA by PCR POSITIVE (*) NEGATIVE Final   Comment:            The GeneXpert MRSA Assay (FDA     approved for NASAL specimens     only), is one component of a     comprehensive MRSA colonization     surveillance program. It is not     intended to diagnose MRSA     infection nor to guide or     monitor treatment for     MRSA infections.     RESULT CALLED TO, READ BACK BY AND VERIFIED WITH:     C.PORTER RN 0630 04/05/14 E.GADDY  URINE CULTURE     Status: None   Collection Time    04/05/14  5:09 AM      Result Value Ref Range Status   Specimen Description URINE, CATHETERIZED   Final   Special Requests NONE   Final   Culture  Setup Time     Final   Value: 04/05/2014 05:29     Performed at [facility]   Colony Count     Final   Value: NO GROWTH     Performed at [facility]   Culture     Final   Value: NO GROWTH     Performed at [facility]   Report Status 04/06/2014 FINAL   Final     Studies: No results found.  Scheduled Meds: . aspirin  81 mg Oral Daily  . atorvastatin  80 mg Oral q1800  . Chlorhexidine Gluconate Cloth  6 each Topical Daily  . clopidogrel  75 mg Oral Daily  . darbepoetin (ARANESP) injection - DIALYSIS  200 mcg  Intravenous Q Thu-HD  . feeding supplement (NEPRO CARB STEADY)  237 mL Oral BID BM  . heparin  5,000 Units Subcutaneous 3 times per day  . insulin aspart  2-6 Units Subcutaneous 6 times per day  . isosorbide mononitrate  60 mg Oral Daily  . mirtazapine  15 mg Oral QHS  . multivitamin  1 tablet Oral QHS  . mupirocin ointment  1 application Nasal BID  . sertraline  100 mg Oral Daily  . sevelamer carbonate  2.4 g Per Tube TID   Continuous Infusions: . sodium chloride 10 mL/hr at 04/06/14 2000    Active Problems:   Hyperkalemia   Acute respiratory failure  with hypoxia   Malnutrition of moderate degree   Palliative care encounter   Weakness generalized   DNR (do not resuscitate) discussion    Time spent: 35    Jina Olenick C Sarim Rothman  [facility] Pager [phone]. If 7PM-7AM, please contact night-coverage at [email or web address], password [facility] 04/08/2014, 5:00 PM  LOS: 3 days

## 2014-04-09 DIAGNOSIS — E1129 Type 2 diabetes mellitus with other diabetic kidney complication: Secondary | ICD-10-CM

## 2014-04-09 LAB — GLUCOSE, CAPILLARY
GLUCOSE-CAPILLARY: 156 mg/dL — AB (ref 70–99)
GLUCOSE-CAPILLARY: 174 mg/dL — AB (ref 70–99)
Glucose-Capillary: 279 mg/dL — ABNORMAL HIGH (ref 70–99)
Glucose-Capillary: 170 mg/dL — ABNORMAL HIGH (ref 70–99)
Glucose-Capillary: 292 mg/dL — ABNORMAL HIGH (ref 70–99)

## 2014-04-09 MED ORDER — CARVEDILOL 6.25 MG PO TABS
6.2500 mg | ORAL_TABLET | Freq: Two times a day (BID) | ORAL | Status: DC
  Administered 2014-04-09 – 2014-04-11 (×3): 6.25 mg via ORAL
  Filled 2014-04-09 (×6): qty 1

## 2014-04-09 MED ORDER — HYDRALAZINE HCL 25 MG PO TABS
25.0000 mg | ORAL_TABLET | Freq: Three times a day (TID) | ORAL | Status: DC
  Administered 2014-04-09 – 2014-04-11 (×4): 25 mg via ORAL
  Filled 2014-04-09 (×8): qty 1

## 2014-04-09 NOTE — Progress Notes (Signed)
OT Cancellation Note  Patient Details Name: Albert Grant MRN: [id number] DOB: May 22, 1941   Cancelled Treatment:    Reason Eval/Treat Not Completed: Other (comment) Pt is Medicare and current D/C plan is SNF. No apparent immediate acute care OT needs, therefore will defer OT to SNF. If OT eval is needed please call Acute Rehab Dept. at [phone] or text page OT at [phone].   Rae Lips  [phone]  04/09/2014, 2:00 PM

## 2014-04-09 NOTE — Progress Notes (Signed)
04/09/2014 5:36 AM  Patient refused all medications from me on night shift and this morning, he kept stating "home, home, home". Will update Day Shift when they arrive. Will continue to monitor as assess the patient.   Albert Grant

## 2014-04-09 NOTE — Progress Notes (Signed)
[facility] PROGRESS NOTE  Robyn HaberRene Rodelo ZOX:[id number] DOB: 11/12/1940 DOA: 04/05/2014 PCP: No PCP Per Patient Brief narrative 73 year old Albert Grant (Albert Grant)Vietnamese male, ESRD on HD, Advanced dementia. Missed dialysis 5/26. Two recent hospitalizations including one at [facility] for pseudomonas bacteremia, and [facility] several dialysis treatments, discharged 03/20/2014.  [facility] to [place]. Found to be hyperkalemic in need of urgent dialysis and [facility]    Assessment/Plan: Active Problems:   Hyperkalemia -resolved, was treated on admission with Insulin, calcium chloride given x2 in ED, and Emergent HD  Probable Asp PNA -was treated with zosyn 5/27-5/29, dc'ed per CCM after clinical improvement  Acute respiratory failure with hypoxia -was intubated OETT 5/27 >>>5/28 and managed on vent per CCM  -clinically improved  Malnutrition of moderate degree -nutritional supplements  Bradycardia  - In setting of hyperkalemia  and was on BB & clonidine, was treated with atropine x 1 in ED  -resloved Elevated troponin  -initial trop in ED mildly elevated x1, and subsequent trops neg x3- in setting of ESRD, possibly demand ischemia from bradycardia.  -no further w/u recommended HTN, uncontrolled -Continue hydralazine, and coreg. Will not resume clonidine given its additive effect with BB (Pt was brady on admission)  -continue imdur -better BP control, continue current meds CHF  -does not appear volume overloaded Weakness generalized -consult PT/OT -await palliative meeting outcome  Acute on chronic encephalopathy/Dementia  - ?appears to have severe at baseline  -on prn haldol Depression -continue zoloft, remeron DM -continue SSI  h/o recent Pseudomonas Bacteremia CAD -Continue plavix, statin, coreg resumed ESRD -dialysis per renal -appreciate palliative care meeting assistance pt and family desire for him to be FULL CODE, and  continue with dialysis and all treatment. Pt does not desire to stop dialysis.  Anemia of Chronic disease  hgb stable Elevated LFT, r/o congestive hepatopathy  - improving   Code Status: full Family Communication: none at bedside Disposition Plan:  Likely back to SNF   Consultants:  Pt was on CCM service through 5/29  Renal  Procedures: LINES / TUBES:  OETT 5/27 >>>5/28  R fem CVL 5/27 >>> LUE AV fistula 3/9 Emergent HD 5/27  Antibiotics:  Zosyn 5/27>>   HPI/Subjective: Pt alert, more lucid and calm today- oriented x3  Objective: Filed Vitals:   04/09/14 1720  BP: 164/56  Pulse: 68  Temp: 97.6 F (36.4 C)  Resp: 16    Intake/Output Summary (Last 24 hours) at 04/09/14 1914 Last data filed at 04/08/14 2300  Gross per 24 hour  Intake    480 ml  Output      0 ml  Net    480 ml   Filed Weights   04/08/14 1328 04/08/14 2028 04/09/14 0344  Weight: 58.8 kg (129 lb 10.1 oz) 58.798 kg (129 lb 10 oz) 58.798 kg (129 lb 10 oz)    Exam:  General: alert & oriented x 2 In NAD Cardiovascular: RRR, nl S1 s2 Respiratory: CTAB Abdomen: soft +BS NT/ND, no masses palpable Extremities: No cyanosis and no edema     Data Reviewed: Basic Metabolic Panel:  Recent Labs Lab 04/05/14 0344 04/05/14 0730  04/05/14 2229 04/06/14 0415 04/06/14 1110 04/07/14 0355 04/08/14 1033  NA 137 140  --   --  138  --  139 141  K 7.5* 4.4  --   --  4.6  --  3.9 3.7  CL 95* 97  --   --  96  --  97 99  CO2 15* 24  --   --  22  --  29 27  GLUCOSE 256* 107*  --   --  137*  --  80 214*  BUN 157* 77*  --   --  104*  --  38* 32*  CREATININE 12.94* 6.67*  --   --  9.08*  --  5.13* 4.52*  CALCIUM 10.3 9.1  --   --  8.6  --  7.5* 7.2*  MG 4.4*  --   --   --   --   --   --   --   PHOS 15.5*  --   < > 9.5* 10.4* 11.3* 5.7* 4.3  < > = values in this interval not displayed. Liver Function Tests:  Recent Labs Lab 04/05/14 0206 04/06/14 0415 04/07/14 0355 04/08/14 1033  AST 452* 145*   --   --   ALT 458* 289*  --   --   ALKPHOS 257* 198*  --   --   BILITOT 0.7 0.4  --   --   PROT 6.8 5.9*  --   --   ALBUMIN 3.1* 2.5* 2.5* 2.6*   No results found for this basename: LIPASE, AMYLASE,  in the last 168 hours  Recent Labs Lab 04/05/14 0206  AMMONIA 33   CBC:  Recent Labs Lab 04/05/14 0206 04/05/14 0229 04/05/14 0500 04/06/14 0415 04/07/14 0355 04/08/14 1028  WBC 3.7*  --  4.2 5.5 4.7 5.9  NEUTROABS 3.4  --   --   --   --   --   HGB 8.9* 9.9* 8.4* 8.8* 9.2* 9.2*  HCT 28.2* 29.0* 25.4* 26.9* 28.4* 28.1*  MCV 103.3*  --  100.8* 102.3* 103.3* 102.2*  PLT 178  --  187 177 188 191   Cardiac Enzymes:  Recent Labs Lab 04/05/14 0424 04/05/14 0931 04/05/14 1500  TROPONINI <0.30 <0.30 <0.30   BNP (last 3 results) No results found for this basename: PROBNP,  in the last 8760 hours CBG:  Recent Labs Lab 04/08/14 1412 04/08/14 2031 04/09/14 0806 04/09/14 1150 04/09/14 1719  GLUCAP 149* 279* 156* 170* 174*    Recent Results (from the past 240 hour(s))  URINE CULTURE     Status: None   Collection Time    04/05/14  2:30 AM      Result Value Ref Range Status   Specimen Description URINE, CATHETERIZED   Final   Special Requests NONE   Final   Culture  Setup Time     Final   Value: 04/05/2014 03:23     Performed at [facility] Count     Final   Value: NO GROWTH     Performed at [facility]   Culture     Final   Value: NO GROWTH     Performed at [facility]   Report Status 04/06/2014 FINAL   Final  CULTURE, BLOOD (ROUTINE X 2)     Status: None   Collection Time    04/05/14  3:56 AM      Result Value Ref Range Status   Specimen Description BLOOD RIGHT ANTECUBITAL   Final   Special Requests BOTTLES DRAWN AEROBIC AND ANAEROBIC [facility] EACH   Final   Culture  Setup Time     Final   Value: 04/05/2014 08:34     Performed at [facility]   Culture     Final  Value:        BLOOD CULTURE RECEIVED NO GROWTH TO  DATE CULTURE WILL BE HELD FOR 5 DAYS BEFORE ISSUING A FINAL NEGATIVE REPORT     Performed at [facility]   Report Status PENDING   Incomplete  CULTURE, BLOOD (ROUTINE X 2)     Status: None   Collection Time    04/05/14  4:23 AM      Result Value Ref Range Status   Specimen Description BLOOD RIGHT WRIST   Final   Special Requests     Final   Value: BOTTLES DRAWN AEROBIC AND ANAEROBIC 5CC AER 3CC ANA   Culture  Setup Time     Final   Value: 04/05/2014 08:34     Performed at [facility]   Culture     Final   Value:        BLOOD CULTURE RECEIVED NO GROWTH TO DATE CULTURE WILL BE HELD FOR 5 DAYS BEFORE ISSUING A FINAL NEGATIVE REPORT     Performed at [facility]   Report Status PENDING   Incomplete  MRSA PCR SCREENING     Status: Abnormal   Collection Time    04/05/14  4:57 AM      Result Value Ref Range Status   MRSA by PCR POSITIVE (*) NEGATIVE Final   Comment:            The GeneXpert MRSA Assay (FDA     approved for NASAL specimens     only), is one component of a     comprehensive MRSA colonization     surveillance program. It is not     intended to diagnose MRSA     infection nor to guide or     monitor treatment for     MRSA infections.     RESULT CALLED TO, READ BACK BY AND VERIFIED WITH:     C.PORTER RN 0630 04/05/14 E.GADDY  URINE CULTURE     Status: None   Collection Time    04/05/14  5:09 AM      Result Value Ref Range Status   Specimen Description URINE, CATHETERIZED   Final   Special Requests NONE   Final   Culture  Setup Time     Final   Value: 04/05/2014 05:29     Performed at [facility]   Colony Count     Final   Value: NO GROWTH     Performed at [facility]   Culture     Final   Value: NO GROWTH     Performed at [facility]   Report Status 04/06/2014 FINAL   Final     Studies: No results found.  Scheduled Meds: . aspirin  81 mg Oral Daily  . atorvastatin  80 mg Oral q1800  . carvedilol  6.25  mg Oral BID WC  . Chlorhexidine Gluconate Cloth  6 each Topical Daily  . clopidogrel  75 mg Oral Daily  . darbepoetin (ARANESP) injection - DIALYSIS  200 mcg Intravenous Q Thu-HD  . feeding supplement (NEPRO CARB STEADY)  237 mL Oral BID BM  . heparin  5,000 Units Subcutaneous 3 times per day  . hydrALAZINE  25 mg Oral TID  . insulin aspart  0-5 Units Subcutaneous QHS  . insulin aspart  0-9 Units Subcutaneous TID WC  . isosorbide mononitrate  60 mg Oral Daily  . mirtazapine  15 mg Oral QHS  . multivitamin  1 tablet Oral QHS  . mupirocin ointment  1 application Nasal BID  . sertraline  100 mg Oral Daily  . sevelamer carbonate  2.4 g Per Tube TID   Continuous Infusions: . sodium chloride 10 mL/hr at 04/06/14 2000    Active Problems:   Hyperkalemia   Acute respiratory failure with hypoxia   Malnutrition of moderate degree   Palliative care encounter   Weakness generalized   DNR (do not resuscitate) discussion    Time spent: 35    Mattix Imhof C Cashton Hosley  [facility] Pager [phone]. If 7PM-7AM, please contact night-coverage at [email or web address], password [facility] 04/09/2014, 7:14 PM  LOS: 4 days

## 2014-04-09 NOTE — Progress Notes (Addendum)
Patient Albert Grant      DOB: Aug 07, 1941      AYT:[id number]  Family expresses that culturally and familially they desire to have full code status and full treatment.  They say they have a saying in [place] that you can only do the best that you can do.  They will not be making decision in advanced for MOST form or DNR . They desire full code and if vent support needed then they will come an decide what to do next.  Today patient does not recall saying he does not want dialysis nor does he recall refusing medications. Family aware that he does this and expects that we will be able to work with him to take his treatments.  Greater than 50%  of this time was spent counseling and coordinating care related to the above assessment and plan. Total time [phone] am  Bryndon Cumbie L. Ladona Ridgel, MD MBA The Palliative Medicine Team at [facility] Phone: [phone] Pager: [phone]

## 2014-04-09 NOTE — Evaluation (Signed)
Physical Therapy Evaluation Patient Details Name: Robyn HaberRene Vecchione MRN: [id number] DOB: 10/05/1941 Today's Date: 04/09/2014   History of Present Illness    73 year old [place] male, ESRD on HD, Advanced dementia. Missed dialysis 5/26. [facility], and [facility], discharged 03/20/2014.  [facility] brought to [place]. Found to be hyperkalemic in need of urgent dialysis and [facility]    Clinical Impression  Pt presents with moderate to severe functional limitations that could benefit from PT intervention depending on pt's ability to comprehend and cooperate with therapy.  Plan to initiate PT in acute setting and recommend pursue PT in SNF.    Follow Up Recommendations SNF    Equipment Recommendations  Rolling walker with 5" wheels    Recommendations for Other Services       Precautions / Restrictions Precautions Precautions: Fall Precaution Comments: confusion, weakness      Mobility  Bed Mobility Overal bed mobility: Needs Assistance Bed Mobility: Supine to Sit;Sit to Supine     Supine to sit: Mod assist;HOB elevated Sit to supine: Min assist   General bed mobility comments: Needs assist to lift trunk into sitting and to manage impulsivity.    Transfers Overall transfer level: Needs assistance Equipment used: Rolling walker (2 wheeled);1 person hand held assist Transfers: Sit to/from [employer]Stand;Stand Pivot Transfers Sit to Stand: Mod assist Stand pivot transfers: Max assist       General transfer comment: front guard for bed>bsc and pt [facility] and cues to safely place hands, knees bent so required incr physical assistance to achieve second surface;  attempted use of RW for bsc>bed but pt with limited adherence and squat-pivoting back to bed surface  Ambulation/Gait             General Gait Details:  unable to try today  Stairs            Wheelchair Mobility    Modified Rankin (Stroke Patients Only)       Balance Overall balance assessment: Needs assistance Sitting-balance support: Feet supported;No upper extremity supported Sitting balance-Leahy Scale: Fair     Standing balance support: During functional activity Standing balance-Leahy Scale: Poor                               Pertinent Vitals/Pain No pain indicated no apparent distress     Home Living Family/patient expects to be discharged to:: Skilled nursing facility                      Prior Function Level of Independence: Needs assistance   Gait / Transfers Assistance Needed: unclear, pt unable to report     Comments: Pt has advanced dementia and unable to provide history.  no family at bedside at time of visit     Hand Dominance        Extremity/Trunk Assessment   Upper Extremity Assessment: Defer to OT evaluation           Lower Extremity Assessment: Generalized weakness         Communication   Communication: Prefers language other than English (seems to understand limited [place])  Cognition Arousal/Alertness: Awake/alert Behavior During Therapy: Impulsive Overall Cognitive Status: History of cognitive impairments - at baseline  General Comments      Exercises        Assessment/Plan    PT Assessment Patient needs continued PT services  PT Diagnosis Generalized weakness;Difficulty walking   PT Problem List Decreased safety awareness;Decreased cognition;Decreased mobility;Decreased balance;Decreased knowledge of use of DME  PT Treatment Interventions DME instruction;Gait training;Functional mobility training;Therapeutic activities;Patient/family education   PT Goals (Current goals can be found in the Care Plan section) Acute Rehab PT Goals PT Goal Formulation: Patient unable to participate in goal setting Time For Goal  Achievement: 04/23/14 Potential to Achieve Goals: Fair    Frequency Min 2X/week   Barriers to discharge        Co-evaluation               End of Session Equipment Utilized During Treatment: Gait belt Activity Tolerance:  (limited secondary to cognitive impairment) Patient left: in bed;with call bell/phone within reach;with bed alarm set           Time: 1202-1238 PT Time Calculation (min): 36 min   Charges:   PT Evaluation $Initial PT Evaluation Tier I: 1 Procedure PT Treatments $Therapeutic Activity: 8-22 mins   PT G Codes:          Dennis Bast 04/09/2014, 1:19 PM

## 2014-04-09 NOTE — Progress Notes (Signed)
Subjective: Interval History: has no complaint .  Objective: Vital signs in last 24 hours: Temp:  [97.3 F (36.3 C)-98.9 F (37.2 C)] 97.3 F (36.3 C) (05/31 1007) Pulse Rate:  [61-79] 61 (05/31 1007) Resp:  [16-17] 17 (05/31 1007) BP: (108-206)/(41-97) 108/41 mmHg (05/31 1007) SpO2:  [96 %-100 %] 96 % (05/31 1007) Weight:  [58.798 kg (129 lb 10 oz)-58.8 kg (129 lb 10.1 oz)] 58.798 kg (129 lb 10 oz) (05/31 0344) Weight change: 0.775 kg (1 lb 11.3 oz)  Intake/Output from previous day: 05/30 0701 - 05/31 0700 In: 840 [P.O.:840] Out: 3500  Intake/Output this shift:    General appearance: alert and no distress Resp: clear to auscultation bilaterally Cardio: S1, S2 normal and systolic murmur: holosystolic 2/6, blowing at apex GI: soft, non-tender; bowel sounds normal; no masses,  no organomegaly Extremities: AVF LUA B&T  Lab Results:  Recent Labs  04/07/14 0355 04/08/14 1028  WBC 4.7 5.9  HGB 9.2* 9.2*  HCT 28.4* 28.1*  PLT 188 191   BMET:  Recent Labs  04/07/14 0355 04/08/14 1033  NA 139 141  K 3.9 3.7  CL 97 99  CO2 29 27  GLUCOSE 80 214*  BUN 38* 32*  CREATININE 5.13* 4.52*  CALCIUM 7.5* 7.2*   No results found for this basename: PTH,  in the last 72 hours Iron Studies: No results found for this basename: IRON, TIBC, TRANSFERRIN, FERRITIN,  in the last 72 hours  Studies/Results: No results found.  I have reviewed the patient's current medications.  Assessment/Plan: 1 ESRD stable . Does ok when dialyzes 2 Nonadherence 2 hosp with same issue 3 DM 4 Dementia 5 Anemia P Pall Care mtg    LOS: 4 days   Albert Grant Albert Grant 04/09/2014,11:31 AM

## 2014-04-10 DIAGNOSIS — D631 Anemia in chronic kidney disease: Secondary | ICD-10-CM

## 2014-04-10 DIAGNOSIS — N039 Chronic nephritic syndrome with unspecified morphologic changes: Secondary | ICD-10-CM

## 2014-04-10 DIAGNOSIS — I498 Other specified cardiac arrhythmias: Secondary | ICD-10-CM

## 2014-04-10 LAB — GLUCOSE, CAPILLARY
GLUCOSE-CAPILLARY: 301 mg/dL — AB (ref 70–99)
Glucose-Capillary: 156 mg/dL — ABNORMAL HIGH (ref 70–99)
Glucose-Capillary: 248 mg/dL — ABNORMAL HIGH (ref 70–99)

## 2014-04-10 NOTE — Progress Notes (Signed)
Admit: 04/05/2014 LOS: 5  48M ESRD THS RUE AVF Albert Grant admit with Hyperkalemia, missed HD  Subjective:  Palliative care mtg yesterday, remains committed to full medical care and HD No co Did not take meds this AM     [facility] Weights   04/09/14 0344 04/09/14 2100 04/10/14 0500  Weight: 58.798 kg (129 lb 10 oz) 59.3 kg (130 lb 11.7 oz) 59.3 kg (130 lb 11.7 oz)    Current meds: reviewed, Aranesp qThurs, Renvela powder Current Labs: reviewed   Outpt HD Orders Unit: [facility] Days: THS   Physical Exam:  Blood pressure 170/55, pulse 71, temperature 98.7 F (37.1 C), temperature source Oral, resp. rate 16, height 5\' 5"  (1.651 m), weight 59.3 kg (130 lb 11.7 oz), SpO2 95.00%. NAD, lying in bed RRR Nl wob, ctab S/nt/nd nonfocal No LEE No rashes/lesions NCAT EOMI +B/T at AVF  Assessment 1. ESRD THS AVF [place] [facility] 2. Anemia on ESA 3. HPTH 4. Hyperphosphatemia 5. Dementia  Plan 1.  Next HD tomorrow, on THS schedule.  Ready for dc back to HD unit from renbal perspective.    Sabra Heck MD 04/10/2014, 2:15 PM   Recent Labs Lab 04/06/14 0415 04/06/14 1110 04/07/14 0355 04/08/14 1033  NA 138  --  139 141  K 4.6  --  3.9 3.7  CL 96  --  97 99  CO2 22  --  29 27  GLUCOSE 137*  --  80 214*  BUN 104*  --  38* 32*  CREATININE 9.08*  --  5.13* 4.52*  CALCIUM 8.6  --  7.5* 7.2*  PHOS 10.4* 11.3* 5.7* 4.3    Recent Labs Lab 04/05/14 0206  04/06/14 0415 04/07/14 0355 04/08/14 1028  WBC 3.7*  < > 5.5 4.7 5.9  NEUTROABS 3.4  --   --   --   --   HGB 8.9*  < > 8.8* 9.2* 9.2*  HCT 28.2*  < > 26.9* 28.4* 28.1*  MCV 103.3*  < > 102.3* 103.3* 102.2*  PLT 178  < > 177 188 191  < > = values in this interval not displayed.

## 2014-04-10 NOTE — Progress Notes (Signed)
[facility] PROGRESS NOTE  Albert Grant ZOX:[id number] DOB: 1940/12/19 DOA: 04/05/2014 PCP: No PCP Per Patient Brief narrative 73 year old [place] male, ESRD on HD, Advanced dementia. Missed dialysis 5/26. Two recent hospitalizations including one at [facility] for pseudomonas bacteremia, and most recently at [facility] for missing several dialysis treatments, discharged 03/20/2014.  Pt had AMS and vomiting at [facility] 5/26, brought to [place]. Found to be hyperkalemic in need of urgent dialysis and was admitted to [facility].    Assessment/Plan: Active Problems:   Hyperkalemia -resolved, was treated on admission with Insulin, calcium chloride given x2 in ED, and Emergent HD  Probable Asp PNA -was treated with zosyn 5/27-5/29, dc'ed per CCM after clinical improvement  Acute respiratory failure with hypoxia -was intubated OETT 5/27 >>>5/28 and managed on vent per CCM  -clinically improved  Malnutrition of moderate degree -nutritional supplements  Bradycardia  - In setting of hyperkalemia  and was on BB & clonidine, was treated with atropine x 1 in ED  -resloved Elevated troponin  -initial trop in ED mildly elevated x1, and subsequent trops neg x3- in setting of ESRD, possibly demand ischemia from bradycardia.  -no further w/u recommended HTN, uncontrolled -Continue hydralazine, and coreg. Will not resume clonidine given its additive effect with BB (Pt was brady on admission)  -continue imdur -better BP control, continue current meds CHF  -does not appear volume overloaded Weakness generalized -consult PT/OT -await palliative meeting outcome  Acute on chronic encephalopathy/Dementia  - ?appears to have severe at baseline  -on prn haldol Depression -continue zoloft, remeron DM -continue SSI  h/o recent Pseudomonas Bacteremia CAD -Continue plavix, statin, coreg resumed ESRD -dialysis per renal -appreciate palliative care meeting assistance pt and family desire for him to be FULL CODE, and  continue with dialysis and all treatment. Pt does not desire to stop dialysis. -renal to follow and advise on when ok to d/c from their standpoint -PT recommending SNF and rolling walker  Anemia of Chronic disease  hgb stable Elevated LFT, r/o congestive hepatopathy  - recheck in am   Code Status: full Family Communication: none at bedside Disposition Plan:  Likely back to SNF after dailysis in am if ok with Renal   Consultants:  Pt was on CCM service through 5/29  Renal  Procedures: LINES / TUBES:  OETT 5/27 >>>5/28  R fem CVL 5/27 >>> LUE AV fistula 3/9 Emergent HD 5/27  Antibiotics:  Zosyn 5/27>> 5/29  HPI/Subjective: Sleepy this am but easily aroused, and denies any complaints.  Objective: Filed Vitals:   04/09/14 2100  BP: 131/48  Pulse: 66  Resp: 18   No intake or output data in the 24 hours ending 04/10/14 0749 Filed Weights   04/09/14 0344 04/09/14 2100 04/10/14 0500  Weight: 58.798 kg (129 lb 10 oz) 59.3 kg (130 lb 11.7 oz) 59.3 kg (130 lb 11.7 oz)    Exam:  General: alert & oriented x 2 In NAD Cardiovascular: RRR, nl S1 s2 Respiratory: CTAB Abdomen: soft +BS NT/ND, no masses palpable Extremities: No cyanosis and no edema     Data Reviewed: Basic Metabolic Panel:  Recent Labs Lab 04/05/14 0344 04/05/14 0730  04/05/14 2229 04/06/14 0415 04/06/14 1110 04/07/14 0355 04/08/14 1033  NA 137 140  --   --  138  --  139 141  K 7.5* 4.4  --   --  4.6  --  3.9 3.7  CL 95* 97  --   --  96  --  97  99  CO2 15* 24  --   --  22  --  29 27  GLUCOSE 256* 107*  --   --  137*  --  80 214*  BUN 157* 77*  --   --  104*  --  38* 32*  CREATININE 12.94* 6.67*  --   --  9.08*  --  5.13* 4.52*  CALCIUM 10.3 9.1  --   --  8.6  --  7.5* 7.2*  MG 4.4*  --   --   --   --   --   --   --   PHOS 15.5*  --   < > 9.5* 10.4* 11.3* 5.7* 4.3  < > = values in this interval not displayed. Liver Function Tests:  Recent Labs Lab 04/05/14 0206 04/06/14 0415  04/07/14 0355 04/08/14 1033  AST 452* 145*  --   --   ALT 458* 289*  --   --   ALKPHOS 257* 198*  --   --   BILITOT 0.7 0.4  --   --   PROT 6.8 5.9*  --   --   ALBUMIN 3.1* 2.5* 2.5* 2.6*   No results found for this basename: LIPASE, AMYLASE,  in the last 168 hours  Recent Labs Lab 04/05/14 0206  AMMONIA 33   CBC:  Recent Labs Lab 04/05/14 0206 04/05/14 0229 04/05/14 0500 04/06/14 0415 04/07/14 0355 04/08/14 1028  WBC 3.7*  --  4.2 5.5 4.7 5.9  NEUTROABS 3.4  --   --   --   --   --   HGB 8.9* 9.9* 8.4* 8.8* 9.2* 9.2*  HCT 28.2* 29.0* 25.4* 26.9* 28.4* 28.1*  MCV 103.3*  --  100.8* 102.3* 103.3* 102.2*  PLT 178  --  187 177 188 191   Cardiac Enzymes:  Recent Labs Lab 04/05/14 0424 04/05/14 0931 04/05/14 1500  TROPONINI <0.30 <0.30 <0.30   BNP (last 3 results) No results found for this basename: PROBNP,  in the last 8760 hours CBG:  Recent Labs Lab 04/08/14 2031 04/09/14 0806 04/09/14 1150 04/09/14 1719 04/09/14 2142  GLUCAP 279* 156* 170* 174* 292*    Recent Results (from the past 240 hour(s))  URINE CULTURE     Status: None   Collection Time    04/05/14  2:30 AM      Result Value Ref Range Status   Specimen Description URINE, CATHETERIZED   Final   Special Requests NONE   Final   Culture  Setup Time     Final   Value: 04/05/2014 03:23     Performed at [facility]   Colony Count     Final   Value: NO GROWTH     Performed at [facility]   Culture     Final   Value: NO GROWTH     Performed at [facility]   Report Status 04/06/2014 FINAL   Final  CULTURE, BLOOD (ROUTINE X 2)     Status: None   Collection Time    04/05/14  3:56 AM      Result Value Ref Range Status   Specimen Description BLOOD RIGHT ANTECUBITAL   Final   Special Requests BOTTLES DRAWN AEROBIC AND ANAEROBIC [facility]5CC EACH   Final   Culture  Setup Time     Final   Value: 04/05/2014 08:34     Performed at [facility]   Culture     Final   Value:  BLOOD CULTURE RECEIVED NO GROWTH TO DATE CULTURE WILL BE HELD FOR 5 DAYS BEFORE ISSUING A FINAL NEGATIVE REPORT     Performed at [facility]   Report Status PENDING   Incomplete  CULTURE, BLOOD (ROUTINE X 2)     Status: None   Collection Time    04/05/14  4:23 AM      Result Value Ref Range Status   Specimen Description BLOOD RIGHT WRIST   Final   Special Requests     Final   Value: BOTTLES DRAWN AEROBIC AND ANAEROBIC 5CC AER 3CC ANA   Culture  Setup Time     Final   Value: 04/05/2014 08:34     Performed at [facility]   Culture     Final   Value:        BLOOD CULTURE RECEIVED NO GROWTH TO DATE CULTURE WILL BE HELD FOR 5 DAYS BEFORE ISSUING A FINAL NEGATIVE REPORT     Performed at [facility]   Report Status PENDING   Incomplete  MRSA PCR SCREENING     Status: Abnormal   Collection Time    04/05/14  4:57 AM      Result Value Ref Range Status   MRSA by PCR POSITIVE (*) NEGATIVE Final   Comment:            The GeneXpert MRSA Assay (FDA     approved for NASAL specimens     only), is one component of a     comprehensive MRSA colonization     surveillance program. It is not     intended to diagnose MRSA     infection nor to guide or     monitor treatment for     MRSA infections.     RESULT CALLED TO, READ BACK BY AND VERIFIED WITH:     C.PORTER RN 0630 04/05/14 E.GADDY  URINE CULTURE     Status: None   Collection Time    04/05/14  5:09 AM      Result Value Ref Range Status   Specimen Description URINE, CATHETERIZED   Final   Special Requests NONE   Final   Culture  Setup Time     Final   Value: 04/05/2014 05:29     Performed at [facility]   Colony Count     Final   Value: NO GROWTH     Performed at [facility]   Culture     Final   Value: NO GROWTH     Performed at [facility]   Report Status 04/06/2014 FINAL   Final     Studies: No results found.  Scheduled Meds: . aspirin  81 mg Oral Daily  .  atorvastatin  80 mg Oral q1800  . carvedilol  6.25 mg Oral BID WC  . Chlorhexidine Gluconate Cloth  6 each Topical Daily  . clopidogrel  75 mg Oral Daily  . darbepoetin (ARANESP) injection - DIALYSIS  200 mcg Intravenous Q Thu-HD  . feeding supplement (NEPRO CARB STEADY)  237 mL Oral BID BM  . heparin  5,000 Units Subcutaneous 3 times per day  . hydrALAZINE  25 mg Oral TID  . insulin aspart  0-5 Units Subcutaneous QHS  . insulin aspart  0-9 Units Subcutaneous TID WC  . isosorbide mononitrate  60 mg Oral Daily  . mirtazapine  15 mg Oral QHS  . multivitamin  1 tablet Oral QHS  . mupirocin ointment  1 application Nasal BID  . sertraline  100 mg Oral Daily  . sevelamer carbonate  2.4 g Per Tube TID   Continuous Infusions: . sodium chloride 10 mL/hr at 04/06/14 2000    Active Problems:   Hyperkalemia   Acute respiratory failure with hypoxia   Malnutrition of moderate degree   Palliative care encounter   Weakness generalized   DNR (do not resuscitate) discussion    Time spent: 35    Wannetta Langland C Natasia Sanko  [facility] Pager [phone]. If 7PM-7AM, please contact night-coverage at [email or web address], password [facility] 04/10/2014, 7:49 AM  LOS: 5 days

## 2014-04-11 ENCOUNTER — Other Ambulatory Visit: Payer: Self-pay | Admitting: *Deleted

## 2014-04-11 DIAGNOSIS — I2589 Other forms of chronic ischemic heart disease: Secondary | ICD-10-CM

## 2014-04-11 LAB — CULTURE, BLOOD (ROUTINE X 2)
CULTURE: NO GROWTH
CULTURE: NO GROWTH

## 2014-04-11 LAB — GLUCOSE, CAPILLARY
Glucose-Capillary: 140 mg/dL — ABNORMAL HIGH (ref 70–99)
Glucose-Capillary: 361 mg/dL — ABNORMAL HIGH (ref 70–99)

## 2014-04-11 MED ORDER — HYDRALAZINE HCL 50 MG PO TABS: 50.0000 mg | ORAL_TABLET | Freq: Three times a day (TID) | ORAL | Status: AC

## 2014-04-11 MED ORDER — NEPRO/CARBSTEADY PO LIQD: 237.0000 mL | ORAL | Status: AC | PRN

## 2014-04-11 MED ORDER — PRO-STAT SUGAR FREE PO LIQD: 30.0000 mL | Freq: Two times a day (BID) | ORAL | Status: AC

## 2014-04-11 MED ORDER — OXYCODONE-ACETAMINOPHEN 5-325 MG PO TABS: ORAL_TABLET | ORAL | Status: DC

## 2014-04-11 MED ORDER — AMBULATORY NON FORMULARY MEDICATION: Status: DC

## 2014-04-11 NOTE — Progress Notes (Signed)
Physical Therapy Treatment Patient Details Name: Albert Grant MRN: [id number] DOB: 08/22/1941 Today's Date: 04/11/2014    History of Present Illness 73 year old [place] male, ESRD on HD, Advanced dementia. Missed dialysis 5/26. Two recent hospitalizations including one [facility] for pseudomonas bacteremia, and most recently [facility] for [facility] several dialysis treatments, discharged 03/20/2014.  [facility], brought to [place]. Found to be hyperkalemic in need of urgent dialysis and [facility]     PT Comments    Participating with PT, but ultimately impulsively got back to bed; Hopeful to be abl eto try walking next session  Follow Up Recommendations  SNF     Equipment Recommendations  Rolling walker with 5" wheels    Recommendations for Other Services       Precautions / Restrictions Precautions Precautions: Fall Precaution Comments: confusion, weakness    Mobility  Bed Mobility Overal bed mobility: Needs Assistance Bed Mobility: Supine to Sit;Sit to Supine       Sit to supine: Min assist   General bed mobility comments: Min assist for optimal positioning in bed; Able to lift LEs into bed without physcial assist  Transfers Overall transfer level: Needs assistance Equipment used: Rolling walker (2 wheeled);1 person hand held assist Transfers: Sit to/from [employer];Stand Pivot Transfers Sit to Stand: Mod assist Stand pivot transfers: Max assist       General transfer comment: front guard for bed>bsc and [facility] and cues to safely place hands, knees bent so required incr physical assistance to achieve second surface;  attempted use of RW for bsc>bed but pt with limited adherence and squat-pivoting back to bed surface  Ambulation/Gait             General Gait Details: Asked pt to try, however he pretty impulsively sat back to bed; Smiled when I asked about walking tomorow   Stairs            Wheelchair  Mobility    Modified Rankin (Stroke Patients Only)       Balance     Sitting balance-Leahy Scale: Fair     Standing balance support: Bilateral upper extremity supported Standing balance-Leahy Scale: Poor (tending to prop elbows on RW)                      Cognition Arousal/Alertness: Awake/alert Behavior During Therapy: Impulsive Overall Cognitive Status: History of cognitive impairments - at baseline                      Exercises      General Comments        Pertinent Vitals/Pain no apparent distress     Home Living                      Prior Function            PT Goals (current goals can now be found in the care plan section) Acute Rehab PT Goals PT Goal Formulation: Patient unable to participate in goal setting Time For Goal Achievement: 04/23/14 Potential to Achieve Goals: Fair Progress towards PT goals: Progressing toward goals (slowly)    Frequency  Min 2X/week    PT Plan Current plan remains appropriate    Co-evaluation             End of Session Equipment Utilized During Treatment: Gait belt Activity Tolerance:  (limited secondary  to cognitive impairment) Patient left: in bed;with call bell/phone within reach;with bed alarm set     Time: 6945-0388 PT Time Calculation (min): 10 min  Charges:  $Therapeutic Activity: 8-22 mins                    G Codes:      [facility] [place] 04/11/2014, 4:16 PM  Van Clines, PT  Acute Rehabilitation Services Pager [phone] Office [phone]

## 2014-04-11 NOTE — Procedures (Signed)
I was present at this dialysis session. I have reviewed the session itself and made appropriate changes.   AVF.  Goal UF 3L.  Doing well.  No inpt renal issues.  Next HD 6/4  Sabra Heck  MD 04/11/2014, 8:58 AM

## 2014-04-11 NOTE — Discharge Summary (Signed)
Physician Discharge Summary  Other Atienza ZOX:[id number] DOB: Sep 17, 1941 DOA: 04/05/2014  PCP: No PCP Per Patient  Admit date: 04/05/2014 Discharge date: 04/11/2014  Time spent: >30 minutes  Recommendations for Outpatient Follow-up:  Follow-up Information   Please follow up. (SNF in 1-2days)       Please follow up. (Renal per Dialysis as directed)      followup labs Cmet-per PCP this week, follow up on LFTs.   Discharge Diagnoses:  Active Problems:   Hyperkalemia   Acute respiratory failure with hypoxia   Malnutrition of moderate degree   Palliative care encounter   Weakness generalized    Discharge Condition: Improved/stable  Diet recommendation: Modified carbohydrate/renal  Filed Weights   04/10/14 2024 04/11/14 0746 04/11/14 1120  Weight: 61.8 kg (136 lb 3.9 oz) 61.6 kg (135 lb 12.9 oz) 59.6 kg (131 lb 6.3 oz)    History of present illness:  Pt is a 73 year old [place] male, ESRD on HD, Advanced dementia. Missed dialysis 5/26. Two recent hospitalizations including one at [facility] for pseudomonas bacteremia, and most recently at [facility] for missing several dialysis treatments, discharged 03/20/2014.  Pt had AMS and vomiting at [facility] 5/26, brought to [facility]. Found to be hyperkalemic in need of urgent dialysis and was admitted to [facility] service for further evaluation and management.    Hospital Course:  Hyperkalemia  -As discussed above upon admission she was treated with calcium chloride given x2 in ED, and renal was consulted and she received Emergent HD. With these interventions this resolved, his potassium today prior to discharge is 3.7. Probable Asp PNA  -He had a chest x-ray on admission which showed enlargement of cardiac silhouette with pulmonary vascular congestion, minimal right base atelectasis, he was treated with zosyn 5/27-5/29, dc'ed per CCM after clinical improvementhe will not require any further antibiotics upon discharge. . Acute respiratory failure with hypoxia   -Pt was intubated OETT 5/27 >>>5/28 and managed on vent per CCM  -clinically improved, was extubated on 5/28 and remained stable and was transferred from ICU to medical floor/TRH and his respiratory status has remained stable.  Malnutrition of moderate degree  - he was placed on nutritional supplementsAnd is to continue them upon discharge.   Bradycardia  - In setting of hyperkalemia and was on BB & clonidine, was treated with atropine x 1 in ED  -resloved.the clonidine was discontinued in the hospital. His Coreg was resumed in the hospital and an he has not had any further significant bradycardia.  Elevated troponin  -initial trop in ED mildly elevated x1, and subsequent trops neg x3- in setting of ESRD, possibly demand ischemia from bradycardia.  -no further w/u recommended  HTN, uncontrolled  -Continue hydralazine, and coreg. Will not resume clonidine given its additive effect with BB (Pt was brady on admission)  -continue imdur  - he is to continue hydralazine and Coreg and imdur and followup with PCP for continued monitoring of his blood pressures and adjustment of his doses as clinically appropriate optimal blood pressure control.  CHF  -does not appear volume overloaded  Weakness generalized  -consult PT/OT  -await palliative meeting outcome  Acute on chronic encephalopathy/Dementia  - initially was placed on when necessary Haldol in the hospital, he has improved clinically and appears to be at baseline at this time.  Depression  -continue zoloft, remeron  DM  -continue as outpatient regimen and followup outpatient h/o recent Pseudomonas Bacteremia  CAD  -Continue plavix, statin, coreg resumed  ESRD  -dialysis  per renal  -appreciate palliative care meeting assistance pt and family desire for him to be FULL CODE, and continue with dialysis and all treatment. Pt does not desire to stop dialysis.  -renal to follow and advise on when ok to d/c from their standpoint  -PT  recommending SNF and rolling walker  Anemia of Chronic disease  hgb stable  Elevated LFT,  -The impression was that this was secondary to congestive hepatopathy  -  follow up on recheck of his outpatient PCP.   Procedures: LINES / TUBES:  OETT 5/27 >>>5/28  R fem CVL 5/27 >>>  LUE AV fistula 3/9  Emergent HD 5/27   Consultations: -Pt was on CCM service through 5/29  -Renal   Discharge Exam: Filed Vitals:   04/11/14 1120  BP: 198/95  Pulse: 72  Temp: 98.5 F (36.9 C)  Resp: 18  Exam:   General: alert & oriented x 2 In NAD  Cardiovascular: RRR, nl S1 s2  Respiratory: CTAB  Abdomen: soft +BS NT/ND, no masses palpable  Extremities: No cyanosis and no edema    Discharge Instructions You were cared for by a hospitalist during your hospital stay. If you have any questions about your discharge medications or the care you received while you were in the hospital after you are discharged, you can call the unit and asked to speak with the hospitalist on call if the hospitalist that took care of you is not available. Once you are discharged, your primary care physician will handle any further medical issues. Please note that NO REFILLS for any discharge medications will be authorized once you are discharged, as it is imperative that you return to your primary care physician (or establish a relationship with a primary care physician if you do not have one) for your aftercare needs so that they can reassess your need for medications and monitor your lab values.      Discharge Instructions   Diet general    Complete by:  As directed      Increase activity slowly    Complete by:  As directed             Medication List    STOP taking these medications       cloNIDine 0.3 mg/24hr patch  Commonly known as:  CATAPRES - Dosed in mg/24 hr     promethazine 25 MG tablet  Commonly known as:  PHENERGAN      TAKE these medications       AMBULATORY NON FORMULARY MEDICATION  -  Lorazepam 1mg /ml Gel  - Sig: Apply 1ml topically twice daily as needed for anxiety     aspirin 81 MG tablet  Take 81 mg by mouth daily.     atorvastatin 80 MG tablet  Commonly known as:  LIPITOR  Take 80 mg by mouth at bedtime. For hypercholesterolemia     carvedilol 25 MG tablet  Commonly known as:  COREG  Take 25 mg by mouth 2 (two) times daily with a meal. For HTN (10am & 10pm)     clopidogrel 75 MG tablet  Commonly known as:  PLAVIX  Take 75 mg by mouth daily.     feeding supplement (NEPRO CARB STEADY) Liqd  Take 237 mLs by mouth as needed (missed meal during dialysis.).     feeding supplement (PRO-STAT SUGAR FREE 64) Liqd  Take 30 mLs by mouth 2 (two) times daily. 10am and 10pm     ferrous sulfate 325 (65 FE) MG  tablet  Take 325 mg by mouth 3 (three) times daily with meals. 10am, 2pm, 10pm     guaifenesin 100 MG/5ML syrup  Commonly known as:  ROBITUSSIN  Take 200 mg by mouth every 4 (four) hours as needed for cough.     hydrALAZINE 50 MG tablet  Commonly known as:  APRESOLINE  Take 1 tablet (50 mg total) by mouth 3 (three) times daily. For HTN (10am, 2pm, 10pm)     hydrOXYzine 25 MG tablet  Commonly known as:  ATARAX/VISTARIL  Take 25 mg by mouth every 4 (four) hours as needed for anxiety.     insulin aspart 100 UNIT/ML injection  Commonly known as:  novoLOG  Inject 6 Units into the skin at bedtime.     insulin aspart 100 UNIT/ML injection  Commonly known as:  novoLOG  Inject 2 Units into the skin 3 (three) times daily before meals.     insulin aspart 100 UNIT/ML injection  Commonly known as:  novoLOG  Inject 0-9 Units into the skin 3 (three) times daily with meals.     insulin glargine 100 UNIT/ML injection  Commonly known as:  LANTUS  Inject 12 Units into the skin at bedtime.     isosorbide mononitrate 60 MG 24 hr tablet  Commonly known as:  IMDUR  Take 60 mg by mouth daily. For HTN     mirtazapine 15 MG tablet  Commonly known as:  REMERON  Take 15  mg by mouth at bedtime.     nitroGLYCERIN 0.4 MG SL tablet  Commonly known as:  NITROSTAT  Place 0.4 mg under the tongue every 5 (five) minutes as needed for chest pain (May repeat for 3 doses as needed for chest pain. If chest pain persist call the MD.).     ondansetron 4 MG tablet  Commonly known as:  ZOFRAN  Take 4 mg by mouth every 8 (eight) hours as needed for nausea or vomiting.     oxyCODONE-acetaminophen 5-325 MG per tablet  Commonly known as:  PERCOCET/ROXICET  Take one tablet by mouth every 6 hours as needed for severe pain     pantoprazole 40 MG tablet  Commonly known as:  PROTONIX  Take 40 mg by mouth 2 (two) times daily. For esophageal reflux (6:30am & 4:30pm)     PHOSLO 667 MG capsule  Generic drug:  calcium acetate  Take 667-2,001 mg by mouth 4 (four) times daily - after meals and at bedtime. Take 3 capsules (2001 mg) 3 times daily with meals, take 1 capsule (667 mg) every night at bedtime with snack     PRESCRIPTION MEDICATION  Take 1 packet by mouth daily. Novosource (supplement)     sertraline 100 MG tablet  Commonly known as:  ZOLOFT  Take 100 mg by mouth daily. For mood     SUPER B COMPLEX Tabs  Take 1 tablet by mouth daily.       Allergies  Allergen Reactions  . Lisinopril Other (See Comments)    Per MAR   Follow-up Information   Please follow up. (SNF in 1-2days)       Please follow up. (Renal for Dialysis as directed)        The results of significant diagnostics from this hospitalization (including imaging, microbiology, ancillary and laboratory) are listed below for reference.    Significant Diagnostic Studies: Dg Chest Port 1 View  04/05/2014   CLINICAL DATA:  Endotracheal tube positioning.  EXAM: PORTABLE CHEST - 1 VIEW  COMPARISON:  Chest radiograph performed earlier today at 1:52 a.m.  FINDINGS: The patient's endotracheal tube is seen ending 2-3 cm above the carina. An enteric tube is noted extending below the diaphragm.  There is mild  elevation of the right hemidiaphragm. Vascular congestion is noted. Mild bilateral atelectasis is seen. No pleural effusion or pneumothorax is identified, though the left costophrenic angle is incompletely imaged on this study.  The cardiomediastinal silhouette is mildly enlarged. No acute osseous abnormalities are identified. External pacing pads are seen.  IMPRESSION: 1. Endotracheal tube seen ending 2 3 cm above the carina. 2. Mild elevation of the right hemidiaphragm. Vascular congestion and mild cardiomegaly noted. Mild bilateral atelectasis seen.   Electronically Signed   By: Roanna Raider M.D.   On: 04/05/2014 04:48   Dg Chest Port 1 View  04/05/2014   CLINICAL DATA:  Crackles, shortness of breath, history cardiomyopathy, CHF, diabetes, hypertension, coronary artery disease, end-stage renal disease  EXAM: PORTABLE CHEST - 1 VIEW  COMPARISON:  Portable exam 0152 hr compared to 03/14/2014  FINDINGS: Enlargement of cardiac silhouette with pulmonary vascular congestion.  Atherosclerotic calcification aorta.  Mediastinal contours otherwise normal.  Minimal RIGHT base atelectasis.  No acute infiltrate, pleural effusion or pneumothorax.  Coronary arterial stent noted projecting over LEFT heart.  Bones unremarkable.  IMPRESSION: Enlargement of cardiac silhouette with pulmonary vascular congestion.  Minimal RIGHT base atelectasis.   Electronically Signed   By: Ulyses Southward M.D.   On: 04/05/2014 02:32   Dg Chest Port 1 View  03/14/2014   CLINICAL DATA:  Bradycardia, hypotension.  EXAM: PORTABLE CHEST - 1 VIEW  COMPARISON:  DG CHEST 2 VIEW dated 01/16/2014  FINDINGS: The cardiac silhouette appears moderately enlarged, similar. And mediastinal silhouette is nonsuspicious, mildly calcified. Mild central pulmonary vasculature congestion with decreased from prior examination with interval removal of dialysis catheter. No pleural effusions or focal consolidations though, the costophrenic angles are incompletely imaged. No  pneumothorax.  Symmetric calcifications in the neck are likely vasculature. Multiple EKG lines overlie the patient and may obscure subtle underlying pathology. Soft tissue planes and included osseous structures are nonsuspicious.  IMPRESSION: Stable cardiomegaly, decreased central pulmonary vasculature congestion. Interval removal of dialysis catheter.   Electronically Signed   By: Awilda Metro   On: 03/14/2014 22:04    Microbiology: Recent Results (from the past 240 hour(s))  URINE CULTURE     Status: None   Collection Time    04/05/14  2:30 AM      Result Value Ref Range Status   Specimen Description URINE, CATHETERIZED   Final   Special Requests NONE   Final   Culture  Setup Time     Final   Value: 04/05/2014 03:23     Performed at [facility]   Colony Count     Final   Value: NO GROWTH     Performed at [facility]   Culture     Final   Value: NO GROWTH     Performed at [facility]   Report Status 04/06/2014 FINAL   Final  CULTURE, BLOOD (ROUTINE X 2)     Status: None   Collection Time    04/05/14  3:56 AM      Result Value Ref Range Status   Specimen Description BLOOD RIGHT ANTECUBITAL   Final   Special Requests BOTTLES DRAWN AEROBIC AND ANAEROBIC [facility] EACH   Final   Culture  Setup Time     Final  Value: 04/05/2014 08:34     [facility]   Culture     Final   Value: NO GROWTH 5 DAYS     Performed at [facility]   Report Status 04/11/2014 FINAL   Final  CULTURE, BLOOD (ROUTINE X 2)     Status: None   Collection Time    04/05/14  4:23 AM      Result Value Ref Range Status   Specimen Description BLOOD RIGHT WRIST   Final   Special Requests     Final   Value: BOTTLES DRAWN AEROBIC AND ANAEROBIC 5CC AER 3CC ANA   Culture  Setup Time     Final   Value: 04/05/2014 08:34     [facility]formed at [facility]   Culture     Final   Value: NO GROWTH 5 DAYS     Performed at [facility]   Report Status  04/11/2014 FINAL   Final  MRSA PCR SCREENING     Status: Abnormal   Collection Time    04/05/14  4:57 AM      Result Value Ref Range Status   MRSA by PCR POSITIVE (*) NEGATIVE Final   Comment:            The GeneXpert MRSA Assay (FDA     approved for NASAL specimens     only), is one component of a     comprehensive MRSA colonization     surveillance program. It is not     intended to diagnose MRSA     infection nor to guide or     monitor treatment for     MRSA infections.     RESULT CALLED TO, READ BACK BY AND VERIFIED WITH:     C.PORTER RN 0630 04/05/14 E.GADDY  URINE CULTURE     Status: None   Collection Time    04/05/14  5:09 AM      Result Value Ref Range Status   Specimen Description URINE, CATHETERIZED   Final   Special Requests NONE   Final   Culture  Setup Time     Final   Value: 04/05/2014 05:29     Performed at [facility]   Colony Count     Final   Value: NO GROWTH     Performed at [facility]   Culture     Final   Value: NO GROWTH     Performed at [facility]   Report Status 04/06/2014 FINAL   Final     Labs: Basic Metabolic Panel:  Recent Labs Lab 04/05/14 0344 04/05/14 0730  04/05/14 2229 04/06/14 0415 04/06/14 1110 04/07/14 0355 04/08/14 1033  NA 137 140  --   --  138  --  139 141  K 7.5* 4.4  --   --  4.6  --  3.9 3.7  CL 95* 97  --   --  96  --  97 99  CO2 15* 24  --   --  22  --  29 27  GLUCOSE 256* 107*  --   --  137*  --  80 214*  BUN 157* 77*  --   --  104*  --  38* 32*  CREATININE 12.94* 6.67*  --   --  9.08*  --  5.13* 4.52*  CALCIUM 10.3 9.1  --   --  8.6  --  7.5* 7.2*  MG 4.4*  --   --   --   --   --   --   --  PHOS 15.5*  --   < > 9.5* 10.4* 11.3* 5.7* 4.3  < > = values in this interval not displayed. Liver Function Tests:  Recent Labs Lab 04/05/14 0206 04/06/14 0415 04/07/14 0355 04/08/14 1033  AST 452* 145*  --   --   ALT 458* 289*  --   --   ALKPHOS 257* 198*  --   --   BILITOT 0.7 0.4  --    --   PROT 6.8 5.9*  --   --   ALBUMIN 3.1* 2.5* 2.5* 2.6*   No results found for this basename: LIPASE, AMYLASE,  in the last 168 hours  Recent Labs Lab 04/05/14 0206  AMMONIA 33   CBC:  Recent Labs Lab 04/05/14 0206 04/05/14 0229 04/05/14 0500 04/06/14 0415 04/07/14 0355 04/08/14 1028  WBC 3.7*  --  4.2 5.5 4.7 5.9  NEUTROABS 3.4  --   --   --   --   --   HGB 8.9* 9.9* 8.4* 8.8* 9.2* 9.2*  HCT 28.2* 29.0* 25.4* 26.9* 28.4* 28.1*  MCV 103.3*  --  100.8* 102.3* 103.3* 102.2*  PLT 178  --  187 177 188 191   Cardiac Enzymes:  Recent Labs Lab 04/05/14 0424 04/05/14 0931 04/05/14 1500  TROPONINI <0.30 <0.30 <0.30   BNP: BNP (last 3 results) No results found for this basename: PROBNP,  in the last 8760 hours CBG:  Recent Labs Lab 04/09/14 2142 04/10/14 0730 04/10/14 1508 04/10/14 2021 04/11/14 1254  GLUCAP 292* 156* 248* 301* 140*       Signed:  Tarrin Lebow C Caetano Oberhaus  [facility] 04/11/2014, 2:51 PM

## 2014-04-11 NOTE — Progress Notes (Signed)
Physician Discharge Summary  Albert Grant WJX:[id number] DOB: 1941-04-14 DOA: 04/05/2014  PCP: No PCP Per Patient  Admit date: 04/05/2014 Discharge date: 04/11/2014  Time spent: >30 minutes  Recommendations for Outpatient Follow-up:  Follow-up Information   Please follow up. (SNF in 1-2days)       Please follow up. (Renal for Dialysis as directed)      followup labs Cmet-per PCP this week, follow up on LFTs.   Discharge Diagnoses:  Active Problems:   Hyperkalemia   Acute respiratory failure with hypoxia   Malnutrition of moderate degree   Palliative care encounter   Weakness generalized   DNR (do not resuscitate) discussion   Discharge Condition: Improved/stable  Diet recommendation: Modified carbohydrate/renal  Filed Weights   04/10/14 2024 04/11/14 0746 04/11/14 1120  Weight: 61.8 kg (136 lb 3.9 oz) 61.6 kg (135 lb 12.9 oz) 59.6 kg (131 lb 6.3 oz)    History of present illness:  Pt is a 73 year old [place] male, ESRD on HD, Advanced dementia. Missed dialysis 5/26. Two recent hospitalizations including one at [facility] for pseudomonas bacteremia, and most recently at [facility] for missing several dialysis treatments, discharged 03/20/2014.  Pt had AMS and vomiting at [facility] [place] 5/26, brought to [place]. Found to be hyperkalemic in need of urgent dialysis and was admitted to [facility] service for further evaluation and management.    Hospital Course:  Hyperkalemia  -As discussed above upon admission she was treated with calcium chloride given x2 in ED, and renal was consulted and she received Emergent HD. With these interventions this resolved, his potassium today prior to discharge is 3.7. Probable Asp PNA  -He had a chest x-ray on admission which showed enlargement of cardiac silhouette with pulmonary vascular congestion, minimal right base atelectasis, he was treated with zosyn 5/27-5/29, dc'ed per CCM after clinical improvementhe will not require any further antibiotics upon discharge.  . Acute respiratory failure with hypoxia  -Pt was intubated OETT 5/27 >>>5/28 and managed on vent per CCM  -clinically improved, was extubated on 5/28 and remained stable and was transferred from ICU to medical floor/TRH and his respiratory status has remained stable.  Malnutrition of moderate degree  - he was placed on nutritional supplementsAnd is to continue them upon discharge.   Bradycardia  - In setting of hyperkalemia and was on BB & clonidine, was treated with atropine x 1 in ED  -resloved.the clonidine was discontinued in the hospital. His Coreg was resumed in the hospital and an he has not had any further significant bradycardia.  Elevated troponin  -initial trop in ED mildly elevated x1, and subsequent trops neg x3- in setting of ESRD, possibly demand ischemia from bradycardia.  -no further w/u recommended  HTN, uncontrolled  -Continue hydralazine, and coreg. Will not resume clonidine given its additive effect with BB (Pt was brady on admission)  -continue imdur  - he is to continue hydralazine and Coreg and imdur and followup with PCP for continued monitoring of his blood pressures and adjustment of his doses as clinically appropriate optimal blood pressure control.  CHF  -does not appear volume overloaded  Weakness generalized  -consult PT/OT  -await palliative meeting outcome  Acute on chronic encephalopathy/Dementia  - initially was placed on when necessary Haldol in the hospital, he has improved clinically and appears to be at baseline at this time.  Depression  -continue zoloft, remeron  DM  -continue as outpatient regimen and followup outpatient h/o recent Pseudomonas Bacteremia  CAD  -Continue plavix, statin,  coreg resumed  ESRD  -dialysis per renal  -appreciate palliative care meeting assistance pt and family desire for him to be FULL CODE, and continue with dialysis and all treatment. Pt does not desire to stop dialysis.  -renal to follow and advise on when ok to  d/c from their standpoint  -PT recommending SNF and rolling walker  Anemia of Chronic disease  hgb stable  Elevated LFT,  -The impression was that this was secondary to congestive hepatopathy  -  follow up on recheck of his outpatient PCP.   Procedures: LINES / TUBES:  OETT 5/27 >>>5/28  R fem CVL 5/27 >>>  LUE AV fistula 3/9  Emergent HD 5/27   Consultations: -Pt was on CCM service through 5/29  -Renal   Discharge Exam: Filed Vitals:   04/11/14 1120  BP: 198/95  Pulse: 72  Temp: 98.5 F (36.9 C)  Resp: 18  Exam:   General: alert & oriented x 2 In NAD  Cardiovascular: RRR, nl S1 s2  Respiratory: CTAB  Abdomen: soft +BS NT/ND, no masses palpable  Extremities: No cyanosis and no edema    Discharge Instructions You were cared for by a hospitalist during your hospital stay. If you have any questions about your discharge medications or the care you received while you were in the hospital after you are discharged, you can call the unit and asked to speak with the hospitalist on call if the hospitalist that took care of you is not available. Once you are discharged, your primary care physician will handle any further medical issues. Please note that NO REFILLS for any discharge medications will be authorized once you are discharged, as it is imperative that you return to your primary care physician (or establish a relationship with a primary care physician if you do not have one) for your aftercare needs so that they can reassess your need for medications and monitor your lab values.  Discharge Instructions   Diet general    Complete by:  As directed      Increase activity slowly    Complete by:  As directed             Medication List    STOP taking these medications       cloNIDine 0.3 mg/24hr patch  Commonly known as:  CATAPRES - Dosed in mg/24 hr     promethazine 25 MG tablet  Commonly known as:  PHENERGAN      TAKE these medications       AMBULATORY NON  FORMULARY MEDICATION  - Lorazepam 1mg /ml Gel  - Sig: Apply 1ml topically twice daily as needed for anxiety     aspirin 81 MG tablet  Take 81 mg by mouth daily.     atorvastatin 80 MG tablet  Commonly known as:  LIPITOR  Take 80 mg by mouth at bedtime. For hypercholesterolemia     carvedilol 25 MG tablet  Commonly known as:  COREG  Take 25 mg by mouth 2 (two) times daily with a meal. For HTN (10am & 10pm)     clopidogrel 75 MG tablet  Commonly known as:  PLAVIX  Take 75 mg by mouth daily.     feeding supplement (NEPRO CARB STEADY) Liqd  Take 237 mLs by mouth as needed (missed meal during dialysis.).     feeding supplement (PRO-STAT SUGAR FREE 64) Liqd  Take 30 mLs by mouth 2 (two) times daily. 10am and 10pm     ferrous sulfate 325 (65  FE) MG tablet  Take 325 mg by mouth 3 (three) times daily with meals. 10am, 2pm, 10pm     guaifenesin 100 MG/5ML syrup  Commonly known as:  ROBITUSSIN  Take 200 mg by mouth every 4 (four) hours as needed for cough.     hydrALAZINE 50 MG tablet  Commonly known as:  APRESOLINE  Take 1 tablet (50 mg total) by mouth 3 (three) times daily. For HTN (10am, 2pm, 10pm)     hydrOXYzine 25 MG tablet  Commonly known as:  ATARAX/VISTARIL  Take 25 mg by mouth every 4 (four) hours as needed for anxiety.     insulin aspart 100 UNIT/ML injection  Commonly known as:  novoLOG  Inject 6 Units into the skin at bedtime.     insulin aspart 100 UNIT/ML injection  Commonly known as:  novoLOG  Inject 2 Units into the skin 3 (three) times daily before meals.     insulin aspart 100 UNIT/ML injection  Commonly known as:  novoLOG  Inject 0-9 Units into the skin 3 (three) times daily with meals.     insulin glargine 100 UNIT/ML injection  Commonly known as:  LANTUS  Inject 12 Units into the skin at bedtime.     isosorbide mononitrate 60 MG 24 hr tablet  Commonly known as:  IMDUR  Take 60 mg by mouth daily. For HTN     mirtazapine 15 MG tablet  Commonly  known as:  REMERON  Take 15 mg by mouth at bedtime.     nitroGLYCERIN 0.4 MG SL tablet  Commonly known as:  NITROSTAT  Place 0.4 mg under the tongue every 5 (five) minutes as needed for chest pain (May repeat for 3 doses as needed for chest pain. If chest pain persist call the MD.).     ondansetron 4 MG tablet  Commonly known as:  ZOFRAN  Take 4 mg by mouth every 8 (eight) hours as needed for nausea or vomiting.     oxyCODONE-acetaminophen 5-325 MG per tablet  Commonly known as:  PERCOCET/ROXICET  Take one tablet by mouth every 6 hours as needed for severe pain     pantoprazole 40 MG tablet  Commonly known as:  PROTONIX  Take 40 mg by mouth 2 (two) times daily. For esophageal reflux (6:30am & 4:30pm)     PHOSLO 667 MG capsule  Generic drug:  calcium acetate  Take 667-2,001 mg by mouth 4 (four) times daily - after meals and at bedtime. Take 3 capsules (2001 mg) 3 times daily with meals, take 1 capsule (667 mg) every night at bedtime with snack     PRESCRIPTION MEDICATION  Take 1 packet by mouth daily. Novosource (supplement)     sertraline 100 MG tablet  Commonly known as:  ZOLOFT  Take 100 mg by mouth daily. For mood     SUPER B COMPLEX Tabs  Take 1 tablet by mouth daily.       Allergies  Allergen Reactions  . Lisinopril Other (See Comments)    Per MAR      The results of significant diagnostics from this hospitalization (including imaging, microbiology, ancillary and laboratory) are listed below for reference.    Significant Diagnostic Studies: Dg Chest Port 1 View  04/05/2014   CLINICAL DATA:  Endotracheal tube positioning.  EXAM: PORTABLE CHEST - 1 VIEW  COMPARISON:  Chest radiograph performed earlier today at 1:52 a.m.  FINDINGS: The patient's endotracheal tube is seen ending 2-3 cm above the carina. An enteric  tube is noted extending below the diaphragm.  There is mild elevation of the right hemidiaphragm. Vascular congestion is noted. Mild bilateral atelectasis is  seen. No pleural effusion or pneumothorax is identified, though the left costophrenic angle is incompletely imaged on this study.  The cardiomediastinal silhouette is mildly enlarged. No acute osseous abnormalities are identified. External pacing pads are seen.  IMPRESSION: 1. Endotracheal tube seen ending 2 3 cm above the carina. 2. Mild elevation of the right hemidiaphragm. Vascular congestion and mild cardiomegaly noted. Mild bilateral atelectasis seen.   Electronically Signed   By: Roanna Raider M.D.   On: 04/05/2014 04:48   Dg Chest Port 1 View  04/05/2014   CLINICAL DATA:  Crackles, shortness of breath, history cardiomyopathy, CHF, diabetes, hypertension, coronary artery disease, end-stage renal disease  EXAM: PORTABLE CHEST - 1 VIEW  COMPARISON:  Portable exam 0152 hr compared to 03/14/2014  FINDINGS: Enlargement of cardiac silhouette with pulmonary vascular congestion.  Atherosclerotic calcification aorta.  Mediastinal contours otherwise normal.  Minimal RIGHT base atelectasis.  No acute infiltrate, pleural effusion or pneumothorax.  Coronary arterial stent noted projecting over LEFT heart.  Bones unremarkable.  IMPRESSION: Enlargement of cardiac silhouette with pulmonary vascular congestion.  Minimal RIGHT base atelectasis.   Electronically Signed   By: Ulyses Southward M.D.   On: 04/05/2014 02:32   Dg Chest Port 1 View  03/14/2014   CLINICAL DATA:  Bradycardia, hypotension.  EXAM: PORTABLE CHEST - 1 VIEW  COMPARISON:  DG CHEST 2 VIEW dated 01/16/2014  FINDINGS: The cardiac silhouette appears moderately enlarged, similar. And mediastinal silhouette is nonsuspicious, mildly calcified. Mild central pulmonary vasculature congestion with decreased from prior examination with interval removal of dialysis catheter. No pleural effusions or focal consolidations though, the costophrenic angles are incompletely imaged. No pneumothorax.  Symmetric calcifications in the neck are likely vasculature. Multiple EKG lines  overlie the patient and may obscure subtle underlying pathology. Soft tissue planes and included osseous structures are nonsuspicious.  IMPRESSION: Stable cardiomegaly, decreased central pulmonary vasculature congestion. Interval removal of dialysis catheter.   Electronically Signed   By: Awilda Metro   On: 03/14/2014 22:04    Microbiology: Recent Results (from the past 240 hour(s))  URINE CULTURE     Status: None   Collection Time    04/05/14  2:30 AM      Result Value Ref Range Status   Specimen Description URINE, CATHETERIZED   Final   Special Requests NONE   Final   Culture  Setup Time     Final   Value: 04/05/2014 03:23     Performed at [facility]   Colony Count     Final   Value: NO GROWTH     Performed at [facility]   Culture     Final   Value: NO GROWTH     Performed at [facility]   Report Status 04/06/2014 FINAL   Final  CULTURE, BLOOD (ROUTINE X 2)     Status: None   Collection Time    04/05/14  3:56 AM      Result Value Ref Range Status   Specimen Description BLOOD RIGHT ANTECUBITAL   Final   Special Requests BOTTLES DRAWN AEROBIC AND ANAEROBIC [facility] EACH   Final   Culture  Setup Time     Final   Value: 04/05/2014 08:34     Performed at [facility]   Culture     Final   Value: NO GROWTH  5 DAYS     Performed at [facility]   Report Status 04/11/2014 FINAL   Final  CULTURE, BLOOD (ROUTINE X 2)     Status: None   Collection Time    04/05/14  4:23 AM      Result Value Ref Range Status   Specimen Description BLOOD RIGHT WRIST   Final   Special Requests     Final   Value: BOTTLES DRAWN AEROBIC AND ANAEROBIC 5CC AER 3CC ANA   Culture  Setup Time     Final   Value: 04/05/2014 08:34     Performed at [facility]   Culture     Final   Value: NO GROWTH 5 DAYS     Performed at [facility]   Report Status 04/11/2014 FINAL   Final  MRSA PCR SCREENING     Status: Abnormal   Collection Time    04/05/14   4:57 AM      Result Value Ref Range Status   MRSA by PCR POSITIVE (*) NEGATIVE Final   Comment:            The GeneXpert MRSA Assay (FDA     approved for NASAL specimens     only), is one component of a     comprehensive MRSA colonization     surveillance program. It is not     intended to diagnose MRSA     infection nor to guide or     monitor treatment for     MRSA infections.     RESULT CALLED TO, READ BACK BY AND VERIFIED WITH:     C.PORTER RN 0630 04/05/14 E.GADDY  URINE CULTURE     Status: None   Collection Time    04/05/14  5:09 AM      Result Value Ref Range Status   Specimen Description URINE, CATHETERIZED   Final   Special Requests NONE   Final   Culture  Setup Time     Final   Value: 04/05/2014 05:29     Performed at [facility]   Colony Count     Final   Value: NO GROWTH     Performed at [facility]   Culture     Final   Value: NO GROWTH     Performed at [facility]   Report Status 04/06/2014 FINAL   Final     Labs: Basic Metabolic Panel:  Recent Labs Lab 04/05/14 0344 04/05/14 0730  04/05/14 2229 04/06/14 0415 04/06/14 1110 04/07/14 0355 04/08/14 1033  NA 137 140  --   --  138  --  139 141  K 7.5* 4.4  --   --  4.6  --  3.9 3.7  CL 95* 97  --   --  96  --  97 99  CO2 15* 24  --   --  22  --  29 27  GLUCOSE 256* 107*  --   --  137*  --  80 214*  BUN 157* 77*  --   --  104*  --  38* 32*  CREATININE 12.94* 6.67*  --   --  9.08*  --  5.13* 4.52*  CALCIUM 10.3 9.1  --   --  8.6  --  7.5* 7.2*  MG 4.4*  --   --   --   --   --   --   --   PHOS 15.5*  --   < >  9.5* 10.4* 11.3* 5.7* 4.3  < > = values in this interval not displayed. Liver Function Tests:  Recent Labs Lab 04/05/14 0206 04/06/14 0415 04/07/14 0355 04/08/14 1033  AST 452* 145*  --   --   ALT 458* 289*  --   --   ALKPHOS 257* 198*  --   --   BILITOT 0.7 0.4  --   --   PROT 6.8 5.9*  --   --   ALBUMIN 3.1* 2.5* 2.5* 2.6*   No results found for this  basename: LIPASE, AMYLASE,  in the last 168 hours  Recent Labs Lab 04/05/14 0206  AMMONIA 33   CBC:  Recent Labs Lab 04/05/14 0206 04/05/14 0229 04/05/14 0500 04/06/14 0415 04/07/14 0355 04/08/14 1028  WBC 3.7*  --  4.2 5.5 4.7 5.9  NEUTROABS 3.4  --   --   --   --   --   HGB 8.9* 9.9* 8.4* 8.8* 9.2* 9.2*  HCT 28.2* 29.0* 25.4* 26.9* 28.4* 28.1*  MCV 103.3*  --  100.8* 102.3* 103.3* 102.2*  PLT 178  --  187 177 188 191   Cardiac Enzymes:  Recent Labs Lab 04/05/14 0424 04/05/14 0931 04/05/14 1500  TROPONINI <0.30 <0.30 <0.30   BNP: BNP (last 3 results) No results found for this basename: PROBNP,  in the last 8760 hours CBG:  Recent Labs Lab 04/09/14 2142 04/10/14 0730 04/10/14 1508 04/10/14 2021 04/11/14 1254  GLUCAP 292* 156* 248* 301* 140*       Signed:  Asante Ritacco C Reighlyn Elmes  [facility] 04/11/2014, 2:06 PM

## 2014-04-11 NOTE — Telephone Encounter (Signed)
[facility] 

## 2014-04-12 ENCOUNTER — Other Ambulatory Visit: Payer: Self-pay | Admitting: *Deleted

## 2014-04-12 ENCOUNTER — Non-Acute Institutional Stay (SKILLED_NURSING_FACILITY): Payer: Medicare Other | Admitting: Internal Medicine

## 2014-04-12 DIAGNOSIS — I119 Hypertensive heart disease without heart failure: Secondary | ICD-10-CM

## 2014-04-12 DIAGNOSIS — I2510 Atherosclerotic heart disease of native coronary artery without angina pectoris: Secondary | ICD-10-CM

## 2014-04-12 DIAGNOSIS — E1129 Type 2 diabetes mellitus with other diabetic kidney complication: Secondary | ICD-10-CM

## 2014-04-12 DIAGNOSIS — I509 Heart failure, unspecified: Secondary | ICD-10-CM

## 2014-04-12 MED ORDER — OXYCODONE-ACETAMINOPHEN 5-325 MG PO TABS: ORAL_TABLET | ORAL | Status: DC

## 2014-04-12 NOTE — Telephone Encounter (Signed)
[facility] 

## 2014-04-14 DIAGNOSIS — I5022 Chronic systolic (congestive) heart failure: Secondary | ICD-10-CM | POA: Insufficient documentation

## 2014-04-14 NOTE — Progress Notes (Signed)
HISTORY & PHYSICAL  DATE: 04/12/2014   FACILITY: [facility]  LEVEL OF CARE: SNF (31)  ALLERGIES:  Allergies  Allergen Reactions  . Lisinopril Other (See Comments)    Per MAR    CHIEF COMPLAINT:  Manage hypertension, CHF and diabetes mellitus  HISTORY OF PRESENT ILLNESS: Patient is a 73 year old [place] male who was hospitalized secondary to severe hyperkalemia. After hospitalization he is readmitted back to the facility for long-term care management. Patient is a poor historian.  CHF:The patient does not relate significant weight changes, denies sob, DOE, orthopnea, PNDs, pedal edema, palpitations or chest pain.  CHF remains stable.  No complications form the medications being used.  HTN: Pt 's HTN remains stable.  Denies CP, sob, DOE, pedal edema, headaches, dizziness or visual disturbances.  No complications from the medications currently being used.  Last BP : 134/68  DM:pt's DM remains stable.  Pt denies polyuria, polydipsia, polyphagia, changes in vision or hypoglycemic episodes.  No complications noted from the medication presently being used.  Last hemoglobin A1c is: Not available.  PAST MEDICAL HISTORY :  Past Medical History  Diagnosis Date  . 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 Hypertension   . CAD (coronary artery disease)      with angioplasty  . 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 Cardiomyopathy     with ejection fraction of 45 %  . Heart failure, systolic and diastolic   . Anemia   . CHF (congestive heart failure)     PAST SURGICAL HISTORY: Past Surgical History  Procedure Laterality Date  . Spine surgery      spinal fusion  . Av fistula placement Left 01/16/2014    Procedure: ARTERIOVENOUS (AV) FISTULA CREATION; ULTRASOUND GUIDED;  Surgeon: Larina Earthlyodd F Early, MD;  [facility];  Service: Vascular;  Laterality: Left;    SOCIAL HISTORY:  reports that he has never smoked. He has never used  smokeless tobacco. He reports that he does not drink alcohol or use illicit drugs.  FAMILY HISTORY: None  CURRENT MEDICATIONS: Reviewed per MAR/see medication list  REVIEW OF SYSTEMS: Unobtainable due to patient being a poor historian  PHYSICAL EXAMINATION  VS:  See VS section  GENERAL: no acute distress, normal body habitus EYES: conjunctivae normal, sclerae normal, normal eye lids MOUTH/THROAT: lips without lesions,no lesions in the mouth,tongue is without lesions,uvula elevates in midline NECK: supple, trachea midline, no neck masses, no thyroid tenderness, no thyromegaly LYMPHATICS: no LAN in the neck, no supraclavicular LAN RESPIRATORY: breathing is even & unlabored, BS CTAB CARDIAC: RRR, no murmur,no extra heart sounds, no edema GI:  ABDOMEN: abdomen soft, normal BS, no masses, no tenderness  LIVER/SPLEEN: no hepatomegaly, no splenomegaly MUSCULOSKELETAL: HEAD: normal to inspection  EXTREMITIES: LEFT UPPER EXTREMITY: full range of motion, normal strength & tone RIGHT UPPER EXTREMITY:  full range of motion, normal strength & tone LEFT LOWER EXTREMITY:  full range of motion, normal strength & tone RIGHT LOWER EXTREMITY:  full range of motion, normal strength & tone PSYCHIATRIC: the patient is alert & oriented to person, affect & behavior appropriate  LABS/RADIOLOGY:  Labs reviewed: Basic Metabolic Panel:  Recent Labs  40/98/1104/09/24 0845  04/05/14 0344  04/06/14 0415 04/06/14 1110 04/07/14 0355 04/08/14 1033  NA 136*  < > 137  < > 138  --  139 141  K 4.6  < > 7.5*  < >  4.6  --  3.9 3.7  CL 97  < > 95*  < > 96  --  97 99  CO2 25  < > 15*  < > 22  --  29 27  GLUCOSE 128*  < > 256*  < > 137*  --  80 214*  BUN 47*  < > 157*  < > 104*  --  38* 32*  CREATININE 6.97*  < > 12.94*  < > 9.08*  --  5.13* 4.52*  CALCIUM 7.7*  < > 10.3  < > 8.6  --  7.5* 7.2*  MG  --   --  4.4*  --   --   --   --   --   PHOS 5.8*  --  15.5*  < > 10.4* 11.3* 5.7* 4.3  < > = values in this  interval not displayed. Liver Function Tests:  Recent Labs  03/18/14 0500  04/05/14 0206 04/06/14 0415 04/07/14 0355 04/08/14 1033  AST 21  --  452* 145*  --   --   ALT 40  --  458* 289*  --   --   ALKPHOS 134*  --  257* 198*  --   --   BILITOT 0.4  --  0.7 0.4  --   --   PROT 5.6*  --  6.8 5.9*  --   --   ALBUMIN 2.4*  < > 3.1* 2.5* 2.5* 2.6*  < > = values in this interval not displayed.   Recent Labs  03/15/14 0100 04/05/14 0206  AMMONIA 18 33   CBC:  Recent Labs  03/17/14 0506 03/18/14 0500 04/05/14 0206  04/06/14 0415 04/07/14 0355 04/08/14 1028  WBC 4.7 5.2 3.7*  < > 5.5 4.7 5.9  NEUTROABS 3.3 4.1 3.4  --   --   --   --   HGB 8.1* 8.2* 8.9*  < > 8.8* 9.2* 9.2*  HCT 25.2* 24.7* 28.2*  < > 26.9* 28.4* 28.1*  MCV 97.7 98.4 103.3*  < > 102.3* 103.3* 102.2*  PLT 192 176 178  < > 177 188 191  < > = values in this interval not displayed.  Cardiac Enzymes:  Recent Labs  04/05/14 0424 04/05/14 0931 04/05/14 1500  TROPONINI <0.30 <0.30 <0.30   CBG:  Recent Labs  04/10/14 2021 04/11/14 1254 04/11/14 1658  GLUCAP 301* 140* 361*    PORTABLE CHEST - 1 VIEW   COMPARISON:  Chest radiograph performed earlier today at 1:52 a.m.   FINDINGS: The patient's endotracheal tube is seen ending 2-3 cm above the carina. An enteric tube is noted extending below the diaphragm.   There is mild elevation of the right hemidiaphragm. Vascular congestion is noted. Mild bilateral atelectasis is seen. No pleural effusion or pneumothorax is identified, though the left costophrenic angle is incompletely imaged on this study.   The cardiomediastinal silhouette is mildly enlarged. No acute osseous abnormalities are identified. External pacing pads are seen.   IMPRESSION: 1. Endotracheal tube seen ending 2 3 cm above the carina. 2. Mild elevation of the right hemidiaphragm. Vascular congestion and mild cardiomegaly noted. Mild bilateral atelectasis  seen.  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  CPT CODE: 06237  Angela Cox, MD [facility] [phone]

## 2014-04-16 ENCOUNTER — Non-Acute Institutional Stay (SKILLED_NURSING_FACILITY): Payer: Medicare Other | Admitting: Internal Medicine

## 2014-04-16 DIAGNOSIS — E1129 Type 2 diabetes mellitus with other diabetic kidney complication: Secondary | ICD-10-CM

## 2014-04-16 DIAGNOSIS — N185 Chronic kidney disease, stage 5: Secondary | ICD-10-CM

## 2014-04-19 NOTE — Progress Notes (Addendum)
Patient ID: Albert Grant, male   DOB: [birth year], 73 y.o.   MRN: [id number]                  PROGRESS NOTE  DATE:  04/14/2014    FACILITY: [facility]    LEVEL OF CARE:   SNF   Acute Visit   CHIEF COMPLAINT:  Discussion with the patient with regards to dialysis refusals.      HISTORY OF PRESENT ILLNESS:  This is a patient who was recently in [place] from 04/05/2014 through 04/11/2014.  He had  apparently not gone to dialysis on 04/04/2014.  On arrival to [facility], he had altered LOC with a potassium of 7.5.   He required ventilation and urgent dialysis.    As per usual, he was seen by Palliative Care and the desire was to continue dialysis and to remain a Full Code.    He was also noted to have elevated liver function tests,  felt to be secondary to biventricular heart failure.  I will try to follow up on this.  At discharge, transaminase and alk phos were in the 200 range.    The patient was also in the hospital earlier in May, admitted on 03/19/2014.  At that point, he also was listed as being noncompliant with dialysis.     PHYSICAL EXAMINATION:   PSYCHIATRIC:   MENTAL STATUS:   There is really no evidence of depression or delirium at the present time.  The patient is orientated to the year, month.  Was able to tell me his date of birth, when he came to the [place] via [place].  When he is ill, he is consistently labeled as having dementia although I do not really think that this is the case (?delirium).  Certainly, I do not get the sense that he is demented enough to affect his capacity to decide dialysis.  With regards to the question at hand, the patient was very adamant with me today that he never refuses to go to dialysis.  I will, therefore, ask the facility staff about how this actually happens.   GENERAL APPEARANCE:  The patient is awake, alert, pleasant.   CHEST/RESPIRATORY:  Clear air entry bilaterally.   CARDIOVASCULAR:  CARDIAC:   He appears to be euvolemic.   There are no murmurs.   EDEMA/VARICOSITIES:  Extremities:  No edema.   GASTROINTESTINAL:  LIVER/SPLEEN/KIDNEYS:  No liver, no spleen.   ABDOMEN:  He has a firm area in the lower left abdomen.  I suspect this may be Lovenox injection sites.    ASSESSMENT/PLAN:  Chronic renal failure.  The patient states he wishes to continue dialysis and he is really adamant with me today that he never refuses.  As there is more than one reason to be labeled as "refusing", I will try to discuss this with the staff to get a feel for exactly what happens during these days.  He appears to be stable now.

## 2014-05-11 ENCOUNTER — Emergency Department (HOSPITAL_COMMUNITY): Payer: Medicare Other

## 2014-05-11 ENCOUNTER — Inpatient Hospital Stay (HOSPITAL_COMMUNITY)
Admission: EM | Admit: 2014-05-11 | Discharge: 2014-05-19 | DRG: 640 | Disposition: A | Discharge status: Skilled Nursing Facility | Payer: Medicare Other | Attending: Internal Medicine | Admitting: Internal Medicine

## 2014-05-11 DIAGNOSIS — E1122 Type 2 diabetes mellitus with diabetic chronic kidney disease: Secondary | ICD-10-CM

## 2014-05-11 DIAGNOSIS — Z66 Do not resuscitate: Secondary | ICD-10-CM | POA: Diagnosis present

## 2014-05-11 DIAGNOSIS — Z515 Encounter for palliative care: Secondary | ICD-10-CM

## 2014-05-11 DIAGNOSIS — G9340 Encephalopathy, unspecified: Secondary | ICD-10-CM | POA: Diagnosis present

## 2014-05-11 DIAGNOSIS — I509 Heart failure, unspecified: Secondary | ICD-10-CM | POA: Diagnosis present

## 2014-05-11 DIAGNOSIS — N058 Unspecified nephritic syndrome with other morphologic changes: Secondary | ICD-10-CM | POA: Diagnosis present

## 2014-05-11 DIAGNOSIS — D638 Anemia in other chronic diseases classified elsewhere: Secondary | ICD-10-CM | POA: Diagnosis present

## 2014-05-11 DIAGNOSIS — F32A Depression, unspecified: Secondary | ICD-10-CM

## 2014-05-11 DIAGNOSIS — K219 Gastro-esophageal reflux disease without esophagitis: Secondary | ICD-10-CM | POA: Diagnosis present

## 2014-05-11 DIAGNOSIS — E1121 Type 2 diabetes mellitus with diabetic nephropathy: Secondary | ICD-10-CM

## 2014-05-11 DIAGNOSIS — E1169 Type 2 diabetes mellitus with other specified complication: Secondary | ICD-10-CM | POA: Diagnosis present

## 2014-05-11 DIAGNOSIS — E872 Acidosis, unspecified: Secondary | ICD-10-CM | POA: Diagnosis present

## 2014-05-11 DIAGNOSIS — Z7982 Long term (current) use of aspirin: Secondary | ICD-10-CM

## 2014-05-11 DIAGNOSIS — Z992 Dependence on renal dialysis: Secondary | ICD-10-CM

## 2014-05-11 DIAGNOSIS — I2589 Other forms of chronic ischemic heart disease: Secondary | ICD-10-CM | POA: Diagnosis present

## 2014-05-11 DIAGNOSIS — Z79899 Other long term (current) drug therapy: Secondary | ICD-10-CM

## 2014-05-11 DIAGNOSIS — I119 Hypertensive heart disease without heart failure: Secondary | ICD-10-CM | POA: Diagnosis present

## 2014-05-11 DIAGNOSIS — Z91199 Patient's noncompliance with other medical treatment and regimen due to unspecified reason: Secondary | ICD-10-CM

## 2014-05-11 DIAGNOSIS — R531 Weakness: Secondary | ICD-10-CM

## 2014-05-11 DIAGNOSIS — D696 Thrombocytopenia, unspecified: Secondary | ICD-10-CM | POA: Diagnosis present

## 2014-05-11 DIAGNOSIS — Z794 Long term (current) use of insulin: Secondary | ICD-10-CM

## 2014-05-11 DIAGNOSIS — D631 Anemia in chronic kidney disease: Secondary | ICD-10-CM

## 2014-05-11 DIAGNOSIS — I498 Other specified cardiac arrhythmias: Secondary | ICD-10-CM | POA: Diagnosis present

## 2014-05-11 DIAGNOSIS — IMO0002 Reserved for concepts with insufficient information to code with codable children: Secondary | ICD-10-CM

## 2014-05-11 DIAGNOSIS — N179 Acute kidney failure, unspecified: Secondary | ICD-10-CM | POA: Diagnosis present

## 2014-05-11 DIAGNOSIS — R001 Bradycardia, unspecified: Secondary | ICD-10-CM | POA: Diagnosis present

## 2014-05-11 DIAGNOSIS — N039 Chronic nephritic syndrome with unspecified morphologic changes: Secondary | ICD-10-CM

## 2014-05-11 DIAGNOSIS — E1129 Type 2 diabetes mellitus with other diabetic kidney complication: Secondary | ICD-10-CM | POA: Diagnosis present

## 2014-05-11 DIAGNOSIS — E440 Moderate protein-calorie malnutrition: Secondary | ICD-10-CM | POA: Diagnosis present

## 2014-05-11 DIAGNOSIS — F332 Major depressive disorder, recurrent severe without psychotic features: Secondary | ICD-10-CM | POA: Diagnosis present

## 2014-05-11 DIAGNOSIS — I5022 Chronic systolic (congestive) heart failure: Secondary | ICD-10-CM | POA: Diagnosis present

## 2014-05-11 DIAGNOSIS — I110 Hypertensive heart disease with heart failure: Secondary | ICD-10-CM

## 2014-05-11 DIAGNOSIS — M899 Disorder of bone, unspecified: Secondary | ICD-10-CM | POA: Diagnosis present

## 2014-05-11 DIAGNOSIS — I2510 Atherosclerotic heart disease of native coronary artery without angina pectoris: Secondary | ICD-10-CM | POA: Diagnosis present

## 2014-05-11 DIAGNOSIS — N186 End stage renal disease: Secondary | ICD-10-CM

## 2014-05-11 DIAGNOSIS — Z7189 Other specified counseling: Secondary | ICD-10-CM

## 2014-05-11 DIAGNOSIS — Z981 Arthrodesis status: Secondary | ICD-10-CM

## 2014-05-11 DIAGNOSIS — E875 Hyperkalemia: Principal | ICD-10-CM | POA: Diagnosis present

## 2014-05-11 DIAGNOSIS — Z9119 Patient's noncompliance with other medical treatment and regimen: Secondary | ICD-10-CM

## 2014-05-11 DIAGNOSIS — F0390 Unspecified dementia without behavioral disturbance: Secondary | ICD-10-CM

## 2014-05-11 DIAGNOSIS — Z91158 Patient's noncompliance with renal dialysis for other reason: Secondary | ICD-10-CM

## 2014-05-11 DIAGNOSIS — I5042 Chronic combined systolic (congestive) and diastolic (congestive) heart failure: Secondary | ICD-10-CM | POA: Diagnosis present

## 2014-05-11 DIAGNOSIS — I10 Essential (primary) hypertension: Secondary | ICD-10-CM

## 2014-05-11 DIAGNOSIS — Z9115 Patient's noncompliance with renal dialysis: Secondary | ICD-10-CM

## 2014-05-11 DIAGNOSIS — M949 Disorder of cartilage, unspecified: Secondary | ICD-10-CM

## 2014-05-11 DIAGNOSIS — F329 Major depressive disorder, single episode, unspecified: Secondary | ICD-10-CM

## 2014-05-11 DIAGNOSIS — I255 Ischemic cardiomyopathy: Secondary | ICD-10-CM

## 2014-05-11 DIAGNOSIS — IMO0001 Reserved for inherently not codable concepts without codable children: Secondary | ICD-10-CM | POA: Diagnosis present

## 2014-05-11 LAB — I-STAT CHEM 8, ED
BUN: 136 mg/dL — ABNORMAL HIGH (ref 6–23)
CHLORIDE: 100 meq/L (ref 96–112)
Calcium, Ion: 0.91 mmol/L — ABNORMAL LOW (ref 1.13–1.30)
Creatinine, Ser: 12.8 mg/dL — ABNORMAL HIGH (ref 0.50–1.35)
Glucose, Bld: 230 mg/dL — ABNORMAL HIGH (ref 70–99)
HCT: 31 % — ABNORMAL LOW (ref 39.0–52.0)
Hemoglobin: 10.5 g/dL — ABNORMAL LOW (ref 13.0–17.0)
Potassium: 6.9 mEq/L (ref 3.7–5.3)
SODIUM: 129 meq/L — AB (ref 137–147)
TCO2: 20 mmol/L (ref 0–100)

## 2014-05-11 LAB — CBG MONITORING, ED: Glucose-Capillary: 209 mg/dL — ABNORMAL HIGH (ref 70–99)

## 2014-05-11 LAB — I-STAT CG4 LACTIC ACID, ED: LACTIC ACID, VENOUS: 1.11 mmol/L (ref 0.5–2.2)

## 2014-05-11 MED ORDER — SODIUM CHLORIDE 0.9 % IV SOLN
1.0000 g | Freq: Once | INTRAVENOUS | Status: AC
  Administered 2014-05-12: 1 g via INTRAVENOUS
  Filled 2014-05-11: qty 10

## 2014-05-11 NOTE — ED Provider Notes (Signed)
CSN: [id number]     Arrival date & time 05/11/14  2327 History   First MD Initiated Contact with Patient 05/11/14 2330     No chief complaint on file.    (Consider location/radiation/quality/duration/timing/severity/associated sxs/prior Treatment) HPI  Patient is a 73 yo man with ESRD on H/D, DMII, HTN and cardiomyopathy. He missed dialysis today. He told staff he wanted to die - per 3rd hand report from EMS via SNF staff. The patient appears to have a language barrier. He seems to understand questions and nods with "yes" or "no"   Past Medical History  Diagnosis Date  . 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 Hypertension   . CAD (coronary artery disease)      with angioplasty  . 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 Cardiomyopathy     with ejection fraction of 45 %  . Heart failure, systolic and diastolic   . Anemia   . CHF (congestive heart failure)    Past Surgical History  Procedure Laterality Date  . Spine surgery      spinal fusion  . Av fistula placement Left 01/16/2014    Procedure: ARTERIOVENOUS (AV) FISTULA CREATION; ULTRASOUND GUIDED;  Surgeon: Larina Earthlyodd F Early, MD;  Location[facility] OR;  Service: Vascular;  Laterality: Left;   No family history on file. History  Substance Use Topics  . Smoking status: Never Smoker   . Smokeless tobacco: Never Used  . Alcohol Use: No    Review of Systems  Unable to obtain ROS due to patient's apparent lack of cooperation. LEVEL 5 CAVEAT   Allergies  Lisinopril  Home Medications   Prior to Admission medications   Medication Sig Start Date End Date Taking? Authorizing Provider  AMBULATORY NON FORMULARY MEDICATION Lorazepam 1mg /ml Gel Sig: Apply 1ml topically twice daily as needed for anxiety 04/11/14   Kimber RelicArthur G Green, MD  Amino Acids-Protein Hydrolys (FEEDING SUPPLEMENT, PRO-STAT SUGAR FREE 64,) LIQD Take 30 mLs by mouth 2 (two) times daily. 10am and 10pm 04/11/14   Kela MillinAdeline C Viyuoh, MD  aspirin 81 MG  tablet Take 81 mg by mouth daily.    Historical Provider, MD  atorvastatin (LIPITOR) 80 MG tablet Take 80 mg by mouth at bedtime. For hypercholesterolemia    Historical Provider, MD  B Complex-C (SUPER B COMPLEX) TABS Take 1 tablet by mouth daily.     Historical Provider, MD  calcium acetate (PHOSLO) 667 MG capsule Take 667-2,001 mg by mouth 4 (four) times daily - after meals and at bedtime. Take 3 capsules (2001 mg) 3 times daily with meals, take 1 capsule (667 mg) every night at bedtime with snack    Historical Provider, MD  carvedilol (COREG) 25 MG tablet Take 25 mg by mouth 2 (two) times daily with a meal. For HTN (10am & 10pm)    Historical Provider, MD  clopidogrel (PLAVIX) 75 MG tablet Take 75 mg by mouth daily.     Historical Provider, MD  ferrous sulfate 325 (65 FE) MG tablet Take 325 mg by mouth 3 (three) times daily with meals. 10am, 2pm, 10pm    Historical Provider, MD  guaifenesin (ROBITUSSIN) 100 MG/5ML syrup Take 200 mg by mouth every 4 (four) hours as needed for cough.    Historical Provider, MD  hydrALAZINE (APRESOLINE) 50 MG tablet Take 1 tablet (50 mg total) by mouth 3 (three) times daily. For HTN (10am, 2pm, 10pm) 04/11/14   Adeline C Viyuoh,  MD  hydrOXYzine (ATARAX/VISTARIL) 25 MG tablet Take 25 mg by mouth every 4 (four) hours as needed for anxiety.     Historical Provider, MD  insulin aspart (NOVOLOG) 100 UNIT/ML injection Inject 0-9 Units into the skin 3 (three) times daily with meals. 03/20/14   [name], MD  insulin aspart (NOVOLOG) 100 UNIT/ML injection Inject 6 Units into the skin at bedtime.    Historical Provider, MD  insulin aspart (NOVOLOG) 100 UNIT/ML injection Inject 2 Units into the skin 3 (three) times daily before meals.    Historical Provider, MD  insulin glargine (LANTUS) 100 UNIT/ML injection Inject 12 Units into the skin at bedtime.    Historical Provider, MD  isosorbide mononitrate (IMDUR) 60 MG 24 hr tablet Take 60 mg by mouth daily. For HTN    Historical  Provider, MD  mirtazapine (REMERON) 15 MG tablet Take 15 mg by mouth at bedtime.    Historical Provider, MD  nitroGLYCERIN (NITROSTAT) 0.4 MG SL tablet Place 0.4 mg under the tongue every 5 (five) minutes as needed for chest pain (May repeat for 3 doses as needed for chest pain. If chest pain persist call the MD.).    Historical Provider, MD  Nutritional Supplements (FEEDING SUPPLEMENT, NEPRO CARB STEADY,) LIQD Take 237 mLs by mouth as needed (missed meal during dialysis.). 04/11/14   Kela Millin, MD  ondansetron (ZOFRAN) 4 MG tablet Take 4 mg by mouth every 8 (eight) hours as needed for nausea or vomiting.    Historical Provider, MD  oxyCODONE-acetaminophen (PERCOCET/ROXICET) 5-325 MG per tablet Take one tablet by mouth every 6 hours as needed for severe pain 04/12/14   Claudie Revering, NP  pantoprazole (PROTONIX) 40 MG tablet Take 40 mg by mouth 2 (two) times daily. For esophageal reflux (6:30am & 4:30pm)    Historical Provider, MD  PRESCRIPTION MEDICATION Take 1 packet by mouth daily. Novosource (supplement)    Historical Provider, MD  sertraline (ZOLOFT) 100 MG tablet Take 100 mg by mouth daily. For mood    Historical Provider, MD   There were no vitals taken for this visit. Physical Exam  Constitutional: He appears well-developed.  Chronically ill appearing  HENT:  Head: Normocephalic and atraumatic.  Right Ear: External ear normal.  Left Ear: External ear normal.  Eyes: Conjunctivae are normal. Pupils are equal, round, and reactive to light.  Neck: Neck supple.  Cardiovascular: Normal heart sounds and intact distal pulses.   No murmur heard. Regular and bradycardic (50s)  Pulmonary/Chest: Effort normal and breath sounds normal. No respiratory distress. He has no wheezes. He has no rales. He exhibits no tenderness.  Abdominal: Soft. There is no tenderness.  Musculoskeletal: He exhibits edema.  Neurological: He is alert.  Skin: Skin is warm and dry.  Psychiatric:  Withdrawn affect  and behavior      ED Course  Procedures (including critical care time) Labs Review  Multiple electrolyte abnormalities - most notably K of 7.0. Patient is also mildly hyperglycemic and hypocalcemic. He is chronically anemic and his Hgb is at baseline.   CXR: cardiomegaly, no acute process identified  EKG: atrial fibrillation with slow rate, no peaked T waves, no acute ischemic changes, normal axis, intervals are irregular. Widened QRS  CRITICAL CARE Performed by: Brandt Loosen   Total critical care time: 45m  Critical care time was exclusive of separately billable procedures and treating other patients.  Critical care was necessary to treat or prevent imminent or life-threatening deterioration.  Critical care was time  spent personally by me on the following activities: development of treatment plan with patient and/or surrogate as well as nursing, discussions with consultants, evaluation of patient's response to treatment, examination of patient, obtaining history from patient or surrogate, ordering and performing treatments and interventions, ordering and review of laboratory studies, ordering and review of radiographic studies, pulse oximetry and re-evaluation of patient's condition.  MDM   Patient is hyperkalemic due to missed dialysis treatments. We are treating with insulin/D50 and calcium gluconate. STAT page to Nephrologist who will arrange for urgent dialysis. Case discussed with hospitalist who will see and admit.     Brandt Loosen, MD 05/22/14 [phone]

## 2014-05-12 ENCOUNTER — Encounter (HOSPITAL_COMMUNITY): Payer: Self-pay | Admitting: Emergency Medicine

## 2014-05-12 DIAGNOSIS — Z992 Dependence on renal dialysis: Secondary | ICD-10-CM

## 2014-05-12 DIAGNOSIS — R001 Bradycardia, unspecified: Secondary | ICD-10-CM | POA: Diagnosis present

## 2014-05-12 DIAGNOSIS — I10 Essential (primary) hypertension: Secondary | ICD-10-CM | POA: Diagnosis present

## 2014-05-12 DIAGNOSIS — I498 Other specified cardiac arrhythmias: Secondary | ICD-10-CM

## 2014-05-12 DIAGNOSIS — N186 End stage renal disease: Secondary | ICD-10-CM

## 2014-05-12 LAB — I-STAT VENOUS BLOOD GAS, ED
Acid-base deficit: 4 mmol/L — ABNORMAL HIGH (ref 0.0–2.0)
Bicarbonate: 21.9 mEq/L (ref 20.0–24.0)
O2 Saturation: 61 %
PCO2 VEN: 44.7 mmHg — AB (ref 45.0–50.0)
TCO2: 23 mmol/L (ref 0–100)
pH, Ven: 7.299 (ref 7.250–7.300)
pO2, Ven: 35 mmHg (ref 30.0–45.0)

## 2014-05-12 LAB — COMPREHENSIVE METABOLIC PANEL
ALBUMIN: 2.9 g/dL — AB (ref 3.5–5.2)
ALT: 101 U/L — ABNORMAL HIGH (ref 0–53)
ANION GAP: 23 — AB (ref 5–15)
AST: 200 U/L — ABNORMAL HIGH (ref 0–37)
Alkaline Phosphatase: 194 U/L — ABNORMAL HIGH (ref 39–117)
BUN: 119 mg/dL — AB (ref 6–23)
CO2: 19 mEq/L (ref 19–32)
Calcium: 7.7 mg/dL — ABNORMAL LOW (ref 8.4–10.5)
Chloride: 90 mEq/L — ABNORMAL LOW (ref 96–112)
Creatinine, Ser: 11.72 mg/dL — ABNORMAL HIGH (ref 0.50–1.35)
GFR calc Af Amer: 4 mL/min — ABNORMAL LOW (ref >90)
GFR calc non Af Amer: 4 mL/min — ABNORMAL LOW (ref >90)
Glucose, Bld: 225 mg/dL — ABNORMAL HIGH (ref 70–99)
Potassium: 7 mEq/L (ref 3.7–5.3)
Sodium: 132 mEq/L — ABNORMAL LOW (ref 137–147)
TOTAL PROTEIN: 6.6 g/dL (ref 6.0–8.3)
Total Bilirubin: 0.5 mg/dL (ref 0.3–1.2)

## 2014-05-12 LAB — BASIC METABOLIC PANEL
ANION GAP: 24 — AB (ref 5–15)
Anion gap: 14 (ref 5–15)
BUN: 122 mg/dL — ABNORMAL HIGH (ref 6–23)
BUN: 35 mg/dL — AB (ref 6–23)
CHLORIDE: 97 meq/L (ref 96–112)
CO2: 18 meq/L — AB (ref 19–32)
CO2: 28 mEq/L (ref 19–32)
Calcium: 9 mg/dL (ref 8.4–10.5)
Calcium: 8.6 mg/dL (ref 8.4–10.5)
Chloride: 91 mEq/L — ABNORMAL LOW (ref 96–112)
Creatinine, Ser: 11.89 mg/dL — ABNORMAL HIGH (ref 0.50–1.35)
Creatinine, Ser: 4.95 mg/dL — ABNORMAL HIGH (ref 0.50–1.35)
GFR calc Af Amer: 4 mL/min — ABNORMAL LOW (ref >90)
GFR calc non Af Amer: 4 mL/min — ABNORMAL LOW (ref >90)
GFR calc non Af Amer: 10 mL/min — ABNORMAL LOW (ref >90)
GFR, EST AFRICAN AMERICAN: 12 mL/min — AB (ref >90)
Glucose, Bld: 138 mg/dL — ABNORMAL HIGH (ref 70–99)
Glucose, Bld: 53 mg/dL — ABNORMAL LOW (ref 70–99)
POTASSIUM: 3.6 meq/L — AB (ref 3.7–5.3)
Potassium: 6.4 mEq/L — ABNORMAL HIGH (ref 3.7–5.3)
SODIUM: 133 meq/L — AB (ref 137–147)
SODIUM: 139 meq/L (ref 137–147)

## 2014-05-12 LAB — CBC WITH DIFFERENTIAL/PLATELET
BASOS PCT: 0 % (ref 0–1)
Basophils Absolute: 0 10*3/uL (ref 0.0–0.1)
EOS ABS: 0 10*3/uL (ref 0.0–0.7)
Eosinophils Relative: 1 % (ref 0–5)
HEMATOCRIT: 30.2 % — AB (ref 39.0–52.0)
Hemoglobin: 9.9 g/dL — ABNORMAL LOW (ref 13.0–17.0)
Lymphocytes Relative: 9 % — ABNORMAL LOW (ref 12–46)
Lymphs Abs: 0.6 10*3/uL — ABNORMAL LOW (ref 0.7–4.0)
MCH: 32.2 pg (ref 26.0–34.0)
MCHC: 32.8 g/dL (ref 30.0–36.0)
MCV: 98.4 fL (ref 78.0–100.0)
MONO ABS: 0.3 10*3/uL (ref 0.1–1.0)
Monocytes Relative: 5 % (ref 3–12)
NEUTROS PCT: 85 % — AB (ref 43–77)
Neutro Abs: 5.6 10*3/uL (ref 1.7–7.7)
Platelets: 137 10*3/uL — ABNORMAL LOW (ref 150–400)
RBC: 3.07 MIL/uL — ABNORMAL LOW (ref 4.22–5.81)
RDW: 13.6 % (ref 11.5–15.5)
WBC: 6.5 10*3/uL (ref 4.0–10.5)

## 2014-05-12 LAB — MAGNESIUM: MAGNESIUM: 2.8 mg/dL — AB (ref 1.5–2.5)

## 2014-05-12 LAB — PHOSPHORUS: Phosphorus: 12.2 mg/dL — ABNORMAL HIGH (ref 2.3–4.6)

## 2014-05-12 LAB — I-STAT TROPONIN, ED: TROPONIN I, POC: 0.03 ng/mL (ref 0.00–0.08)

## 2014-05-12 LAB — GLUCOSE, CAPILLARY
GLUCOSE-CAPILLARY: 90 mg/dL (ref 70–99)
GLUCOSE-CAPILLARY: 43 mg/dL — AB (ref 70–99)
GLUCOSE-CAPILLARY: 84 mg/dL (ref 70–99)

## 2014-05-12 LAB — MRSA PCR SCREENING: MRSA by PCR: NEGATIVE

## 2014-05-12 LAB — POTASSIUM: Potassium: 5.9 mEq/L — ABNORMAL HIGH (ref 3.7–5.3)

## 2014-05-12 LAB — HEPATITIS B SURFACE ANTIGEN: Hepatitis B Surface Ag: NEGATIVE

## 2014-05-12 MED ORDER — PANTOPRAZOLE SODIUM 40 MG PO TBEC: 40.0000 mg | DELAYED_RELEASE_TABLET | Freq: Two times a day (BID) | ORAL | Status: DC

## 2014-05-12 MED ORDER — SODIUM CHLORIDE 0.9 % IV SOLN: 250.0000 mL | INTRAVENOUS | Status: DC | PRN

## 2014-05-12 MED ORDER — SODIUM POLYSTYRENE SULFONATE 15 GM/60ML PO SUSP
60.0000 g | Freq: Once | ORAL | Status: DC
  Filled 2014-05-12: qty 240

## 2014-05-12 MED ORDER — HYDRALAZINE HCL 20 MG/ML IJ SOLN
20.0000 mg | INTRAMUSCULAR | Status: DC
  Administered 2014-05-12 – 2014-05-13 (×3): 20 mg via INTRAVENOUS
  Filled 2014-05-12 (×9): qty 1

## 2014-05-12 MED ORDER — HEPARIN SODIUM (PORCINE) 5000 UNIT/ML IJ SOLN: 5000.0000 [IU] | Freq: Three times a day (TID) | INTRAMUSCULAR | Status: DC

## 2014-05-12 MED ORDER — CALCIUM CHLORIDE 10 % IV SOLN
1.0000 g | Freq: Once | INTRAVENOUS | Status: AC
  Administered 2014-05-11: 1 g via INTRAVENOUS

## 2014-05-12 MED ORDER — CLOPIDOGREL BISULFATE 75 MG PO TABS
75.0000 mg | ORAL_TABLET | Freq: Every day | ORAL | Status: DC
  Administered 2014-05-13 – 2014-05-19 (×6): 75 mg via ORAL
  Filled 2014-05-12 (×7): qty 1

## 2014-05-12 MED ORDER — ISOSORBIDE MONONITRATE ER 60 MG PO TB24
60.0000 mg | ORAL_TABLET | Freq: Every day | ORAL | Status: DC
  Administered 2014-05-13 – 2014-05-19 (×6): 60 mg via ORAL
  Filled 2014-05-12 (×8): qty 1

## 2014-05-12 MED ORDER — INSULIN GLARGINE 100 UNIT/ML ~~LOC~~ SOLN
12.0000 [IU] | Freq: Every day | SUBCUTANEOUS | Status: DC
Start: 2014-05-12 — End: 2014-05-13
  Filled 2014-05-12 (×2): qty 0.12

## 2014-05-12 MED ORDER — LORAZEPAM 2 MG/ML IJ SOLN
1.0000 mg | Freq: Once | INTRAMUSCULAR | Status: AC
  Administered 2014-05-12: 1 mg via INTRAVENOUS
  Filled 2014-05-12: qty 1

## 2014-05-12 MED ORDER — AMLODIPINE BESYLATE 10 MG PO TABS
10.0000 mg | ORAL_TABLET | Freq: Every day | ORAL | Status: DC
  Administered 2014-05-13 – 2014-05-19 (×5): 10 mg via ORAL
  Filled 2014-05-12 (×9): qty 1

## 2014-05-12 MED ORDER — SODIUM BICARBONATE 8.4 % IV SOLN
25.0000 meq | Freq: Once | INTRAVENOUS | Status: AC
  Administered 2014-05-11: 25 meq via INTRAVENOUS

## 2014-05-12 MED ORDER — PANTOPRAZOLE SODIUM 40 MG IV SOLR
40.0000 mg | INTRAVENOUS | Status: DC
  Administered 2014-05-12 – 2014-05-13 (×2): 40 mg via INTRAVENOUS
  Filled 2014-05-12: qty 40

## 2014-05-12 MED ORDER — ASPIRIN 81 MG PO CHEW
81.0000 mg | CHEWABLE_TABLET | Freq: Every day | ORAL | Status: DC
  Administered 2014-05-13 – 2014-05-19 (×6): 81 mg via ORAL
  Filled 2014-05-12 (×8): qty 1

## 2014-05-12 MED ORDER — MIRTAZAPINE 15 MG PO TABS
15.0000 mg | ORAL_TABLET | Freq: Every day | ORAL | Status: DC
  Administered 2014-05-15 – 2014-05-18 (×4): 15 mg via ORAL
  Filled 2014-05-12 (×9): qty 1

## 2014-05-12 MED ORDER — FERROUS SULFATE 325 (65 FE) MG PO TABS
325.0000 mg | ORAL_TABLET | Freq: Three times a day (TID) | ORAL | Status: DC
  Administered 2014-05-13 – 2014-05-19 (×14): 325 mg via ORAL
  Filled 2014-05-12 (×26): qty 1

## 2014-05-12 MED ORDER — HYDRALAZINE HCL 20 MG/ML IJ SOLN
INTRAMUSCULAR | Status: AC
  Filled 2014-05-12: qty 1

## 2014-05-12 MED ORDER — SODIUM POLYSTYRENE SULFONATE 15 GM/60ML PO SUSP: 30.0000 g | Freq: Once | ORAL | Status: DC

## 2014-05-12 MED ORDER — SERTRALINE HCL 100 MG PO TABS
100.0000 mg | ORAL_TABLET | Freq: Every day | ORAL | Status: DC
  Administered 2014-05-13 – 2014-05-14 (×2): 100 mg via ORAL
  Filled 2014-05-12 (×3): qty 1

## 2014-05-12 MED ORDER — CALCIUM ACETATE 667 MG PO CAPS
2001.0000 mg | ORAL_CAPSULE | Freq: Three times a day (TID) | ORAL | Status: DC
  Administered 2014-05-13 – 2014-05-19 (×14): 2001 mg via ORAL
  Filled 2014-05-12 (×26): qty 3

## 2014-05-12 MED ORDER — OXYCODONE-ACETAMINOPHEN 5-325 MG PO TABS: 1.0000 | ORAL_TABLET | Freq: Four times a day (QID) | ORAL | Status: DC | PRN

## 2014-05-12 MED ORDER — ATORVASTATIN CALCIUM 80 MG PO TABS
80.0000 mg | ORAL_TABLET | Freq: Every day | ORAL | Status: DC
  Administered 2014-05-15 – 2014-05-18 (×4): 80 mg via ORAL
  Filled 2014-05-12 (×9): qty 1

## 2014-05-12 MED ORDER — HYDRALAZINE HCL 20 MG/ML IJ SOLN
20.0000 mg | INTRAMUSCULAR | Status: DC | PRN
  Administered 2014-05-12: 20 mg via INTRAVENOUS
  Filled 2014-05-12: qty 1

## 2014-05-12 MED ORDER — HYDRALAZINE HCL 50 MG PO TABS
50.0000 mg | ORAL_TABLET | Freq: Three times a day (TID) | ORAL | Status: DC
  Filled 2014-05-12 (×3): qty 1

## 2014-05-12 MED ORDER — CARVEDILOL 25 MG PO TABS
25.0000 mg | ORAL_TABLET | Freq: Two times a day (BID) | ORAL | Status: DC
  Filled 2014-05-12 (×3): qty 1

## 2014-05-12 MED ORDER — DEXTROSE 50 % IV SOLN
INTRAVENOUS | Status: AC
Start: 2014-05-12 — End: 2014-05-12
  Administered 2014-05-12: 25 mL
  Filled 2014-05-12: qty 50

## 2014-05-12 MED ORDER — MORPHINE SULFATE 2 MG/ML IJ SOLN
2.0000 mg | INTRAMUSCULAR | Status: DC | PRN
  Administered 2014-05-12: 2 mg via INTRAVENOUS
  Filled 2014-05-12: qty 1

## 2014-05-12 MED ORDER — INSULIN ASPART 100 UNIT/ML IV SOLN
5.0000 [IU] | Freq: Once | INTRAVENOUS | Status: AC
  Administered 2014-05-12: 5 [IU] via INTRAVENOUS
  Filled 2014-05-12: qty 0.05

## 2014-05-12 MED ORDER — HEPARIN SODIUM (PORCINE) 1000 UNIT/ML DIALYSIS
100.0000 [IU]/kg | INTRAMUSCULAR | Status: DC | PRN
  Filled 2014-05-12: qty 6

## 2014-05-12 MED ORDER — CALCIUM ACETATE 667 MG PO CAPS
667.0000 mg | ORAL_CAPSULE | Freq: Every day | ORAL | Status: DC
  Administered 2014-05-15 – 2014-05-18 (×4): 667 mg via ORAL
  Filled 2014-05-12 (×9): qty 1

## 2014-05-12 MED ORDER — DEXTROSE 50 % IV SOLN
50.0000 mL | Freq: Once | INTRAVENOUS | Status: AC
  Administered 2014-05-12: 50 mL via INTRAVENOUS
  Filled 2014-05-12: qty 50

## 2014-05-12 MED ORDER — HYDRALAZINE HCL 20 MG/ML IJ SOLN
10.0000 mg | INTRAMUSCULAR | Status: DC | PRN
  Administered 2014-05-12: 20 mg via INTRAVENOUS

## 2014-05-12 MED FILL — Medication: Qty: 1 | Status: AC

## 2014-05-12 NOTE — Consult Note (Signed)
Requesting Physician:  Sedalia Muta, [place] Reason for Consult: Life threatening hyperkalemia in pt with repeated admission for same due to refusal to attend dialysis sessions, HPI: The patient is a 73 y.o. year-old patient of [facility] in [place], [place] ([facility] - TTS) but lives in [facility].  This is the 3rd occasion in the past 2 months that he refused to go to dialysis, then was transported from the SNF via EMS with hypotension,  bradycardia and severe hyperkalemia as a result of missing HD. Being paced.  We are asked to provide emergent HD.  According to a note from Dr. Dellia Nims after his last admission, pt claimed he "never refuses dialysis" and there were plans to discuss with SNF staff exactly what was happening. Palliative Care met with pt and family at last admission and decision was for him to remain full code, continue with dialysis and all treatments.   Creatinine, Ser  Date/Time Value Ref Range Status  05/11/2014 11:54 PM 12.80* 0.50 - 1.35 mg/dL Final  05/11/2014 11:48 PM 11.72* 0.50 - 1.35 mg/dL Final  04/08/2014 10:33 AM 4.52* 0.50 - 1.35 mg/dL Final  04/07/2014  3:55 AM 5.13* 0.50 - 1.35 mg/dL Final     DELTA CHECK NOTED  04/06/2014  4:15 AM 9.08* 0.50 - 1.35 mg/dL Final  04/05/2014  7:30 AM 6.67* 0.50 - 1.35 mg/dL Final     DIALYSIS  04/05/2014  3:44 AM 12.94* 0.50 - 1.35 mg/dL Final  04/05/2014  2:29 AM 13.60* 0.50 - 1.35 mg/dL Final  04/05/2014  2:06 AM 12.77* 0.50 - 1.35 mg/dL Final  03/20/2014  8:45 AM 6.97* 0.50 - 1.35 mg/dL Final  03/18/2014  5:00 AM 8.20* 0.50 - 1.35 mg/dL Final  03/17/2014  5:06 AM 6.56* 0.50 - 1.35 mg/dL Final  03/16/2014  5:40 AM 8.25* 0.50 - 1.35 mg/dL Final  03/15/2014  6:17 AM 6.22* 0.50 - 1.35 mg/dL Final     DELTA CHECK NOTED  03/15/2014  6:17 AM 6.31* 0.50 - 1.35 mg/dL Final     DELTA CHECK NOTED  03/14/2014 11:44 PM 12.30* 0.50 - 1.35 mg/dL Final  03/14/2014 10:52 PM 12.10* 0.50 - 1.35 mg/dL Final  03/14/2014  9:03 PM 12.80* 0.50 - 1.35  mg/dL Final  03/14/2014  8:38 PM 12.25* 0.50 - 1.35 mg/dL Final  02/25/2009  5:10 AM 1.11  0.4 - 1.5 mg/dL Final  02/24/2009  8:54 AM 0.93  0.4 - 1.5 mg/dL Final  02/14/2009 12:45 PM 0.72  0.4 - 1.5 mg/dL Final  01/29/2009 11:34 AM 0.78  0.4 - 1.5 mg/dL Final    Past Medical History  Diagnosis Date  . 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 Hypertension   . CAD (coronary artery disease)      with angioplasty  . 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 Cardiomyopathy     with ejection fraction of 45 %  . Heart failure, systolic and diastolic   . Anemia   . CHF (congestive heart failure)     Past Surgical History:  Past Surgical History  Procedure Laterality Date  . Spine surgery      spinal fusion  . Av fistula placement Left 01/16/2014    Procedure: ARTERIOVENOUS (AV) FISTULA CREATION; ULTRASOUND GUIDED;  Surgeon: Rosetta Posner, MD;  Location: [facility];  Service: Vascular;  Laterality: Left;    Family History: History reviewed. No pertinent family history. Social  History:  reports that he has never smoked. He has never used smokeless tobacco. He reports that he does not drink alcohol or use illicit drugs.  Allergies  Allergen Reactions  . Lisinopril Other (See Comments)    Per MAR   Prior to Admission medications   Medication Sig Start Date End Date Taking? Authorizing Provider  AMBULATORY NON FORMULARY MEDICATION Lorazepam 9m/ml Gel Sig: Apply 157mtopically twice daily as needed for anxiety 04/11/14   ArEstill DoomsMD  Amino Acids-Protein Hydrolys (FEEDING SUPPLEMENT, PRO-STAT SUGAR FREE 64,) LIQD Take 30 mLs by mouth 2 (two) times daily. 10am and 10pm 04/11/14   AdSheila OatsMD  aspirin 81 MG tablet Take 81 mg by mouth daily.    Historical Provider, MD  atorvastatin (LIPITOR) 80 MG tablet Take 80 mg by mouth at bedtime. For hypercholesterolemia    Historical Provider, MD  B Complex-C (SUPER B COMPLEX) TABS Take 1 tablet by mouth daily.     Historical  Provider, MD  calcium acetate (PHOSLO) 667 MG capsule Take 667-2,001 mg by mouth 4 (four) times daily - after meals and at bedtime. Take 3 capsules (2001 mg) 3 times daily with meals, take 1 capsule (667 mg) every night at bedtime with snack    Historical Provider, MD  carvedilol (COREG) 25 MG tablet Take 25 mg by mouth 2 (two) times daily with a meal. For HTN (10am & 10pm)    Historical Provider, MD  clopidogrel (PLAVIX) 75 MG tablet Take 75 mg by mouth daily.     Historical Provider, MD  ferrous sulfate 325 (65 FE) MG tablet Take 325 mg by mouth 3 (three) times daily with meals. 10am, 2pm, 10pm    Historical Provider, MD  guaifenesin (ROBITUSSIN) 100 MG/5ML syrup Take 200 mg by mouth every 4 (four) hours as needed for cough.    Historical Provider, MD  hydrALAZINE (APRESOLINE) 50 MG tablet Take 1 tablet (50 mg total) by mouth 3 (three) times daily. For HTN (10am, 2pm, 10pm) 04/11/14   AdSheila OatsMD  hydrOXYzine (ATARAX/VISTARIL) 25 MG tablet Take 25 mg by mouth every 4 (four) hours as needed for anxiety.     Historical Provider, MD  insulin aspart (NOVOLOG) 100 UNIT/ML injection Inject 0-9 Units into the skin 3 (three) times daily with meals. 03/20/14   SaDebbe OdeaMD  insulin aspart (NOVOLOG) 100 UNIT/ML injection Inject 2 Units into the skin 3 (three) times daily before meals.    Historical Provider, MD  insulin glargine (LANTUS) 100 UNIT/ML injection Inject 12 Units into the skin at bedtime.    Historical Provider, MD  isosorbide mononitrate (IMDUR) 60 MG 24 hr tablet Take 60 mg by mouth daily. For HTN    Historical Provider, MD  mirtazapine (REMERON) 15 MG tablet Take 15 mg by mouth at bedtime.    Historical Provider, MD  nitroGLYCERIN (NITROSTAT) 0.4 MG SL tablet Place 0.4 mg under the tongue every 5 (five) minutes as needed for chest pain (May repeat for 3 doses as needed for chest pain. If chest pain persist call the MD.).    Historical Provider, MD  Nutritional Supplements (FEEDING  SUPPLEMENT, NEPRO CARB STEADY,) LIQD Take 237 mLs by mouth as needed (missed meal during dialysis.). 04/11/14   AdSheila OatsMD  ondansetron (ZOFRAN) 4 MG tablet Take 4 mg by mouth every 8 (eight) hours as needed for nausea or vomiting.    Historical Provider, MD  oxyCODONE-acetaminophen (PERCOCET/ROXICET) 5-325 MG per tablet Take  one tablet by mouth every 6 hours as needed for severe pain 04/12/14   Pricilla Larsson, NP  pantoprazole (PROTONIX) 40 MG tablet Take 40 mg by mouth 2 (two) times daily. For esophageal reflux (6:30am & 4:30pm)    Historical Provider, MD  PRESCRIPTION MEDICATION Take 1 packet by mouth daily. Novosource (supplement)    Historical Provider, MD  sertraline (ZOLOFT) 100 MG tablet Take 100 mg by mouth daily. For mood    Historical Provider, MD    Inpatient medications: . sodium polystyrene  60 g Oral Once    Review of Systems Unobtainable  Physical Exam:  Blood pressure 133/58, pulse 48, temperature 97.5 F (36.4 C), temperature source Oral, resp. rate 18, SpO2 99.00%. Gen: Encephalopathic Asian male. Language barrier Cannot answer questions but does awaken and open eyes Skin: no rash, cyanosis  Neck: no JVD, no bruits or LAN  Chest: Clear  Heart: regular, no rub or gallop bradycardic  Abdomen: soft, mild voluntary guarding  Ext: No edema of the LE's; left upper arm AVF with good bruit and thrill Neuro: Awakens, cannot reallly assess orientation Labs: Basic Metabolic Panel:  Recent Labs Lab 05/11/14 2348 05/11/14 2354  NA 132* 129*  K 7.0* 6.9*  CL 90* 100  CO2 19  --   GLUCOSE 225* 230*  BUN 119* 136*  CREATININE 11.72* 12.80*  CALCIUM 7.7*  --     Recent Labs Lab 05/11/14 2348  AST 200*  ALT 101*  ALKPHOS 194*  BILITOT 0.5  PROT 6.6  ALBUMIN 2.9*    Recent Labs Lab 05/11/14 2348 05/11/14 2354  WBC 6.5  --   NEUTROABS 5.6  --   HGB 9.9* 10.5*  HCT 30.2* 31.0*  MCV 98.4  --   PLT 137*  --     Recent Labs Lab 05/11/14 2347   GLUCAP 209*   Xrays/Other Studies: Dg Chest Port 1 View  05/12/2014   CLINICAL DATA:  Weakness.  EXAM: PORTABLE CHEST - 1 VIEW  COMPARISON:  04/05/2014  FINDINGS: Moderate cardiac enlargement. No pleural effusion or edema. No airspace consolidation.  IMPRESSION: 1. Cardiac enlargement.   Electronically Signed   By: Kerby Moors M.D.   On: 05/12/2014 00:48   Impression/Plan 1. ESRD - TTS [facility]  [facility] 2. Life threatening hyperkalemia - 3rd admission in 2 months for refusing diaysis, then being transported here to be "bailed out".  His primary neph's in [place] need to be involved in the discussion of best plan of action for this man when he refuses treatment. 3. Anemia - Call [facility] in the AM for details of HD Rx, ESA's, PTH status etc 4. DM - primary service 5. Encephalopathy - at [place] in part uremic. Numbers much worse than would expect for a single missed treatment if otherwise well dialyzed...   Jamal Maes,  MD [facility] [phone] pager 05/12/2014, 1:05 AM

## 2014-05-12 NOTE — ED Notes (Signed)
Pt from [facility] with a c/o weakness. Per facility pt also had some rectal bleeding, dry heaving and was altered. Pt refused dialysis today because he was not feeling well. Pt was alert to name and date of birth per EMS. Showing Bradycardia on 12 lead.

## 2014-05-12 NOTE — Progress Notes (Addendum)
PULMONARY / CRITICAL CARE MEDICINE   Name: Albert HaberRene Grant MRN: [id number] DOB: 05/19/1941    ADMISSION DATE:  05/11/2014 CONSULTATION DATE:  05/12/2014  REFERRING MD :  EDP PRIMARY SERVICE: PCCM  CHIEF COMPLAINT:  Hyperkalemia  BRIEF PATIENT DESCRIPTION: 73 yo male ESRD on HD, CAD, DM [facility] after missing dialysis. In ED found to be hyperkalemic at 6.9. Was given Ca++, insulin, and bicarb. Also noted to be in junctional rhythm and has been placed on external pacer. Renal following planning to diurese tonight. PCCM asked to see for admission. Of note has refused dialysis several times recently saying he just wants to die.   SIGNIFICANT EVENTS / STUDIES:  7/2 > missed dialysis, hyperkalemia  LINES / TUBES: PIV  CULTURES: None  ANTIBIOTICS: None   SUBJECTIVE:  Stable but hypertensive, not paced.   VITAL SIGNS: Temp:  [97.5 F (36.4 C)-97.8 F (36.6 C)] 97.8 F (36.6 C) (07/03 0410) Pulse Rate:  [34-134] 89 (07/03 0730) Resp:  [13-25] 17 (07/03 0730) BP: (90-227)/(47-123) 196/121 mmHg (07/03 0730) SpO2:  [90 %-100 %] 100 % (07/03 0730) Weight:  [65.2 kg (143 lb 11.8 oz)] 65.2 kg (143 lb 11.8 oz) (07/03 0356)  HEMODYNAMICS: BP >200 systolic        INTAKE / OUTPUT: Intake/Output   None     PHYSICAL EXAMINATION: General:  Chronically ill appearing male in NAD Neuro:  Spontaneously awake, alert to self. Follows mimic type commands. No focal deficits. HEENT:  [place]/AT, no JVD noted Cardiovascular:  RRR, sinus on monitor, external pacer pads in place Lungs:  Clear bilateral lung fields.  Abdomen:  Soft, non-disteneded Musculoskeletal:  No acute deformity. Moves all extremities.  Skin:  Intact  LABS:  CBC  Recent Labs Lab 05/11/14 2348 05/11/14 2354  WBC 6.5  --   HGB 9.9* 10.5*  HCT 30.2* 31.0*  PLT 137*  --    Coag's No results found for this basename: APTT, INR,  in the last 168 hours BMET  Recent Labs Lab 05/11/14 2348 05/11/14 2354  05/12/14 0244 05/12/14 0510  NA 132* 129* 133*  --   K 7.0* 6.9* 6.4* 5.9*  CL 90* 100 91*  --   CO2 19  --  18*  --   BUN 119* 136* 122*  --   CREATININE 11.72* 12.80* 11.89*  --   GLUCOSE 225* 230* 138*  --    Electrolytes  Recent Labs Lab 05/11/14 2348 05/12/14 0022 05/12/14 0244  CALCIUM 7.7*  --  9.0  MG  --  2.8*  --   PHOS  --  12.2*  --    Sepsis Markers  Recent Labs Lab 05/11/14 2357  LATICACIDVEN 1.11   ABG No results found for this basename: PHART, PCO2ART, PO2ART,  in the last 168 hours Liver Enzymes  Recent Labs Lab 05/11/14 2348  AST 200*  ALT 101*  ALKPHOS 194*  BILITOT 0.5  ALBUMIN 2.9*   Cardiac Enzymes No results found for this basename: TROPONINI, PROBNP,  in the last 168 hours Glucose  Recent Labs Lab 05/11/14 2347  GLUCAP 209*    Imaging Dg Chest Port 1 View  05/12/2014   CLINICAL DATA:  Weakness.  EXAM: PORTABLE CHEST - 1 VIEW  COMPARISON:  04/05/2014  FINDINGS: Moderate cardiac enlargement. No pleural effusion or edema. No airspace consolidation.  IMPRESSION: 1. Cardiac enlargement.   Electronically Signed   By: Signa Kellaylor  Stroud M.D.   On: 05/12/2014 00:48    ASSESSMENT /  PLAN: Principal Problem:   Hyperkalemia Active Problems:   Cardiomyopathy, ischemic   ESRD on dialysis   DM type 2 causing renal disease   Hypertensive heart disease   Acute encephalopathy   Malnutrition of moderate degree   Palliative care encounter   DNR (do not resuscitate) discussion   Chronic systolic heart failure   Bradycardia   Hypertension   PULMONARY A: No acute issues  P:   Supplemental O2 as needed to maintain SpO2 > 92% Follow CXR intermittently  CARDIOVASCULAR A:  Junctional escape rhythm  Now sinus brady on monitor, no obvious T wave peaks.  Recurrent bradycardia. Cardiology recommends d/c of Beta blocker permanently HTN poorly controlled CHF (EF reportedly 45%)  P:  Start po BP meds No further coreg IV  hydralazine  RENAL A:   Acute on CKD  Hyperkalemia  Metabolic acidosis likely uremic  P:   Nephrology following.  HD per renal now F/u bmet at end of HD  GASTROINTESTINAL A:   H/o GERD Elevated transaminases  P:   Continue home PPI. Advance diet after HD Trend LFT, add ammonia if does not resolve after dialysis.  HEMATOLOGIC A:   Anemia of chronic illness Thrombocytopenia VTE ppx  P:  SCD's Follow CBC  INFECTIOUS A:   No acute issues  P:   Monitor clinically  ENDOCRINE A:   DM 2 with renal complications  P:   CBG and SSI  NEUROLOGIC A:   Acute encephalopathy in setting of uremia/acidemia Improved am 7/3  P:   Monitor Does not speak english, please involve interpreter or language phone  Global:  Finish HD, keep in ICU, advance diet later, IV hydralazine extra dose. Start oral meds  I have personally obtained a history, examined the patient, evaluated laboratory and imaging results, formulated the assessment and plan and placed orders.  CRITICAL CARE: The patient is critically ill with multiple organ systems failure and requires high complexity decision making for assessment and support, frequent evaluation and titration of therapies, application of advanced monitoring technologies and extensive interpretation of multiple databases. Critical Care Time devoted to patient care services described in this note is 30 minutes.   Dorcas Carrow Beeper  [phone]  Cell  [phone]  If no response or cell goes to voicemail, call beeper [phone]  [facility] Pulmonary/Critical Care Medicine. After hours pager: [phone].  7:41 AM 05/12/2014

## 2014-05-12 NOTE — Progress Notes (Signed)
INITIAL NUTRITION ASSESSMENT  DOCUMENTATION CODES Per approved criteria  -Non-severe (moderate) malnutrition in the context of chronic illness   INTERVENTION: Advance diet as medically appropriate, add supplements accordingly RD to follow for nutrition care plan  NUTRITION DIAGNOSIS: Increased nutrient needs related to ESRD on HD as evidenced by estimated nutrition needs  Goal: Pt to meet >/= 90% of their estimated nutrition needs   Monitor:  PO & supplemental intake, weight, labs, I/O's  Reason for Assessment: Malnutrition Screening Tool Report  73 y.o. male  Admitting Dx: Hyperkalemia  ASSESSMENT: 73 yo [place] Male with ESRD on HD, CAD, DM presented to [facility] ED after missing dialysis. In ED found to be hyperkalemic at 6.9. Was given Ca++, insulin, and bicarb. Also noted to be in junctional rhythm and has been placed on external pacer. Renal following planning to diurese tonight. Of note has refused dialysis several times recently saying he just wants to die.  RD unable to obtain nutrition hx from patient; sleeping upon RD visit; pt seen per Clinical Nutrition during previous hospitalization; pt with hx of poor PO intake and weight loss; moderate PCM identified with previous nutrition assessment; pt currently NPO; RD to add supplements when/as able.  Nutrition Focused Physical Exam:   Subcutaneous Fat:  Orbital Region: WNL  Upper Arm Region: WNL  Thoracic and Lumbar Region: N/A   Muscle:  Temple Region: WNL  Clavicle Bone Region: mild depletion  Clavicle and Acromion Bone Region: mild depletion  Scapular Bone Region: N/A  Dorsal Hand: WNL  Patellar Region: mild depletion  Anterior Thigh Region: mild depletion  Posterior Calf Region: mild depletion  Edema: none  Patient continues to meet criteria for non-severe (moderate) malnutrition in the context of chronic illness as evidenced by 13% weight loss x 4 months and mild muscle loss.  Height: Ht Readings from Last 1  Encounters:  05/12/14 5\' 5"  (1.651 m)    Weight: Wt Readings from Last 1 Encounters:  05/12/14 137 lb 2 oz (62.2 kg)    Ideal Body Weight: 136 lb  % Ideal Body Weight: 101%  Wt Readings from Last 10 Encounters:  05/12/14 137 lb 2 oz (62.2 kg)  04/11/14 131 lb 6.3 oz (59.6 kg)  04/03/14 148 lb (67.132 kg)  03/19/14 140 lb 14 oz (63.9 kg)  03/02/14 146 lb (66.225 kg)  01/10/14 160 lb 15 oz (73 kg)    Usual Body Weight: 160 lb -- March 2015  % Usual Body Weight: 85%  BMI:  Body mass index is 22.82 kg/(m^2).  Estimated Nutritional Needs: Kcal: 1800-2000 Protein: 90-100 gm Fluid: 1200 ml  Skin: Intact  Diet Order: NPO  EDUCATION NEEDS: -No education needs identified at this time   Intake/Output Summary (Last 24 hours) at 05/12/14 0923 Last data filed at 05/12/14 0835  Gross per 24 hour  Intake      0 ml  Output   3000 ml  Net  -3000 ml    Labs:   Recent Labs Lab 05/11/14 2348 05/11/14 2354 05/12/14 0022 05/12/14 0244 05/12/14 0510  NA 132* 129*  --  133*  --   K 7.0* 6.9*  --  6.4* 5.9*  CL 90* 100  --  91*  --   CO2 19  --   --  18*  --   BUN 119* 136*  --  122*  --   CREATININE 11.72* 12.80*  --  11.89*  --   CALCIUM 7.7*  --   --  9.0  --  MG  --   --  2.8*  --   --   PHOS  --   --  12.2*  --   --   GLUCOSE 225* 230*  --  138*  --     CBG (last 3)   Recent Labs  05/11/14 2347 05/12/14 0840  GLUCAP 209* 90    Scheduled Meds: . amLODipine  10 mg Oral Daily  . aspirin  81 mg Oral Daily  . atorvastatin  80 mg Oral QHS  . calcium acetate  2,001 mg Oral TID WC  . calcium acetate  667 mg Oral QHS  . clopidogrel  75 mg Oral Daily  . ferrous sulfate  325 mg Oral TID WC  . hydrALAZINE      . hydrALAZINE  50 mg Oral TID  . insulin glargine  12 Units Subcutaneous QHS  . isosorbide mononitrate  60 mg Oral Daily  . mirtazapine  15 mg Oral QHS  . pantoprazole  40 mg Oral BID  . sertraline  100 mg Oral Daily  . sodium polystyrene  60 g  Oral Once    Continuous Infusions:   Past Medical History  Diagnosis Date  . 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 Hypertension   . CAD (coronary artery disease)      with angioplasty  . 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 Cardiomyopathy     with ejection fraction of 45 %  . Heart failure, systolic and diastolic   . Anemia   . CHF (congestive heart failure)     Past Surgical History  Procedure Laterality Date  . Spine surgery      spinal fusion  . Av fistula placement Left 01/16/2014    Procedure: ARTERIOVENOUS (AV) FISTULA CREATION; ULTRASOUND GUIDED;  Surgeon: Larina Earthlyodd F Early, MD;  [facility];  Service: Vascular;  Laterality: Left;    Maureen ChattersKatie Donovin Kraemer, RD, LDN Pager #: [phone] After-Hours Pager #: [phone]

## 2014-05-12 NOTE — Progress Notes (Signed)
    Subjective:  Denies CP or dyspnea   Objective:  Filed Vitals:   05/12/14 0615 05/12/14 0630 05/12/14 0645 05/12/14 0700  BP: 221/76 227/97 203/72 196/87  Pulse: 84 84 86 89  Temp:      TempSrc:      Resp: 14 22 14 19   Height:      Weight:      SpO2: 100% 100% 100% 100%    Intake/Output from previous day: No intake or output data in the 24 hours ending 05/12/14 0708  Physical Exam: Physical exam: On dialysis Well-developed well-nourished in no acute distress.  Skin is warm and dry.  HEENT is normal.  Neck is supple.  Chest is clear to auscultation with normal expansion.  Cardiovascular exam is regular rate and rhythm.  Abdominal exam nontender or distended. No masses palpated. Extremities show no edema.     Lab Results: Basic Metabolic Panel:  Recent Labs  78/67/67 2348 05/11/14 2354 05/12/14 0022 05/12/14 0244 05/12/14 0510  NA 132* 129*  --  133*  --   K 7.0* 6.9*  --  6.4* 5.9*  CL 90* 100  --  91*  --   CO2 19  --   --  18*  --   GLUCOSE 225* 230*  --  138*  --   BUN 119* 136*  --  122*  --   CREATININE 11.72* 12.80*  --  11.89*  --   CALCIUM 7.7*  --   --  9.0  --   MG  --   --  2.8*  --   --   PHOS  --   --  12.2*  --   --    CBC:  Recent Labs  05/11/14 2348 05/11/14 2354  WBC 6.5  --   NEUTROABS 5.6  --   HGB 9.9* 10.5*  HCT 30.2* 31.0*  MCV 98.4  --   PLT 137*  --      Assessment/Plan:  1 bradycardia-related to hyperkalemia in combination with beta-blockade. His heart rate is now sinus in the 80s. Continue dialysis and correction of potassium. Would avoid clonidine and beta-blockade. 2 hypertension-his blood pressure is elevated this morning. This should improve with dialysis. Continue hydralazine/nitrate. Norvasc could be added if needed. Would avoid AV nodal blocking agents. 3 end-stage renal disease-dialysis per nephrology. 4 Dementia 5 coronary artery disease-continue aspirin and statin.  Olga Millers 05/12/2014, 7:08  AM

## 2014-05-12 NOTE — Progress Notes (Signed)
Hypoglycemic Event  CBG: 43  Treatment: D50 IV 25 mL  Symptoms: Lethargic, decreased LOC  Follow-up CBG: Time:2230 CBG Result:84  Possible Reasons for Event: Inadequate meal intake Pt NPO  Comments/MD notified: Pt HS Lantus held. Will continue to monitor pt's condition and assess CBG as indicated.    Ignacia Palma  Remember to initiate Hypoglycemia Order Set & complete

## 2014-05-12 NOTE — Consult Note (Addendum)
CARDIOLOGY CONSULT NOTE  Patient ID: Albert Grant, MRN: [id number], DOB/AGE: Jan 27, 1941 73 y.o. Admit date: 05/11/2014 Date of Consult: 05/12/2014  Primary Physician: Karlene Einstein, MD Primary Cardiologist: none  Chief Complaint: weakness Reason for Consultation: bradycardia  HPI: 73 y.o. male w/ PMHx significant for ESRD on HD, CAD, CHF (EF reportedly 45%), dementia,  who presented to [facility] on 05/12/2014 with complaints of weakness. Found to be bradycardic.  Missed dialysis today reportedly due to not feeling well. Presented to [facility] and found to be bradycardic. Labs demontrate K of 6.9. Nausea and dry heaving in the ER, thought to be related his bradycardia and it reportedly improved with application of external pacing.  He reports no complaints though there is a significant language barrier and unclear what his baseline cognition is (both dementia and potentially uremia).   He is comfortably resting with pacer settings turned down to pulse 45, 40 mA.  Given kayexalate, insulin/d50, calcium gluconate, ativan.  Review of his chart demonstrates an almost duplicate hospitalization for this 5/27 when he had bradycardia in the setting of hyperkalemia and medications. Resolved with dialysis and holding clonidine and carvedilol. Carvedilol was restarted during hospitalization.   Past Medical History  Diagnosis Date  . 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 Hypertension   . CAD (coronary artery disease)      with angioplasty  . 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 Cardiomyopathy     with ejection fraction of 45 %  . Heart failure, systolic and diastolic   . Anemia   . CHF (congestive heart failure)       Surgical History:  Past Surgical History  Procedure Laterality Date  . Spine surgery      spinal fusion  . Av fistula placement Left 01/16/2014    Procedure: ARTERIOVENOUS (AV) FISTULA CREATION; ULTRASOUND GUIDED;  Surgeon: Larina Earthly, MD;  Location: [facility] OR;  Service: Vascular;  Laterality: Left;     Home Meds: Prior to Admission medications   Medication Sig Start Date End Date Taking? Authorizing Provider  AMBULATORY NON FORMULARY MEDICATION Lorazepam 1mg /ml Gel Sig: Apply 1ml topically twice daily as needed for anxiety 04/11/14   Kimber Relic, MD  Amino Acids-Protein Hydrolys (FEEDING SUPPLEMENT, PRO-STAT SUGAR FREE 64,) LIQD Take 30 mLs by mouth 2 (two) times daily. 10am and 10pm 04/11/14   Kela Millin, MD  aspirin 81 MG tablet Take 81 mg by mouth daily.    Historical Provider, MD  atorvastatin (LIPITOR) 80 MG tablet Take 80 mg by mouth at bedtime. For hypercholesterolemia    Historical Provider, MD  B Complex-C (SUPER B COMPLEX) TABS Take 1 tablet by mouth daily.     Historical Provider, MD  calcium acetate (PHOSLO) 667 MG capsule Take 667-2,001 mg by mouth 4 (four) times daily - after meals and at bedtime. Take 3 capsules (2001 mg) 3 times daily with meals, take 1 capsule (667 mg) every night at bedtime with snack    Historical Provider, MD  carvedilol (COREG) 25 MG tablet Take 25 mg by mouth 2 (two) times daily with a meal. For HTN (10am & 10pm)    Historical Provider, MD  clopidogrel (PLAVIX) 75 MG tablet Take 75 mg by mouth daily.     Historical Provider, MD  ferrous sulfate 325 (65 FE) MG tablet Take 325 mg by mouth 3 (three) times daily with meals. 10am, 2pm, 10pm  Historical Provider, MD  guaifenesin (ROBITUSSIN) 100 MG/5ML syrup Take 200 mg by mouth every 4 (four) hours as needed for cough.    Historical Provider, MD  hydrALAZINE (APRESOLINE) 50 MG tablet Take 1 tablet (50 mg total) by mouth 3 (three) times daily. For HTN (10am, 2pm, 10pm) 04/11/14   Kela Millin, MD  hydrOXYzine (ATARAX/VISTARIL) 25 MG tablet Take 25 mg by mouth every 4 (four) hours as needed for anxiety.     Historical Provider, MD  insulin aspart (NOVOLOG) 100 UNIT/ML injection Inject 0-9 Units into the skin 3 (three) times daily with  meals. 03/20/14   [name], MD  insulin aspart (NOVOLOG) 100 UNIT/ML injection Inject 2 Units into the skin 3 (three) times daily before meals.    Historical Provider, MD  insulin glargine (LANTUS) 100 UNIT/ML injection Inject 12 Units into the skin at bedtime.    Historical Provider, MD  isosorbide mononitrate (IMDUR) 60 MG 24 hr tablet Take 60 mg by mouth daily. For HTN    Historical Provider, MD  mirtazapine (REMERON) 15 MG tablet Take 15 mg by mouth at bedtime.    Historical Provider, MD  nitroGLYCERIN (NITROSTAT) 0.4 MG SL tablet Place 0.4 mg under the tongue every 5 (five) minutes as needed for chest pain (May repeat for 3 doses as needed for chest pain. If chest pain persist call the MD.).    Historical Provider, MD  Nutritional Supplements (FEEDING SUPPLEMENT, NEPRO CARB STEADY,) LIQD Take 237 mLs by mouth as needed (missed meal during dialysis.). 04/11/14   Kela Millin, MD  ondansetron (ZOFRAN) 4 MG tablet Take 4 mg by mouth every 8 (eight) hours as needed for nausea or vomiting.    Historical Provider, MD  oxyCODONE-acetaminophen (PERCOCET/ROXICET) 5-325 MG per tablet Take one tablet by mouth every 6 hours as needed for severe pain 04/12/14   Claudie Revering, NP  pantoprazole (PROTONIX) 40 MG tablet Take 40 mg by mouth 2 (two) times daily. For esophageal reflux (6:30am & 4:30pm)    Historical Provider, MD  PRESCRIPTION MEDICATION Take 1 packet by mouth daily. Novosource (supplement)    Historical Provider, MD  sertraline (ZOLOFT) 100 MG tablet Take 100 mg by mouth daily. For mood    Historical Provider, MD    Inpatient Medications:  . sodium polystyrene  60 g Oral Once      Allergies:  Allergies  Allergen Reactions  . Lisinopril Other (See Comments)    Per MAR    History   Social History  . Marital Status: Married    Spouse Name: N/A    Number of Children: N/A  . Years of Education: N/A   Occupational History  . Not on file.   Social History Main Topics  . Smoking  status: Never Smoker   . Smokeless tobacco: Never Used  . Alcohol Use: No  . Drug Use: No  . Sexual Activity: Not on file   Other Topics Concern  . Not on file   Social History Narrative  . No narrative on file     History reviewed. No pertinent family history.   Review of Systems: Unable to proceed due to dementia/delerium.  Labs: No results found for this basename: CKTOTAL, CKMB, TROPONINI,  in the last 72 hours Lab Results  Component Value Date   WBC 6.5 05/11/2014   HGB 10.5* 05/11/2014   HCT 31.0* 05/11/2014   MCV 98.4 05/11/2014   PLT 137* 05/11/2014    Recent Labs Lab 05/11/14 2348  05/11/14 2354  NA 132* 129*  K 7.0* 6.9*  CL 90* 100  CO2 19  --   BUN 119* 136*  CREATININE 11.72* 12.80*  CALCIUM 7.7*  --   PROT 6.6  --   BILITOT 0.5  --   ALKPHOS 194*  --   ALT 101*  --   AST 200*  --   GLUCOSE 225* 230*   No results found for this basename: CHOL, HDL, LDLCALC, TRIG   No results found for this basename: DDIMER    Radiology/Studies:  Dg Chest Port 1 View  05/12/2014   CLINICAL DATA:  Weakness.  EXAM: PORTABLE CHEST - 1 VIEW  COMPARISON:  04/05/2014  FINDINGS: Moderate cardiac enlargement. No pleural effusion or edema. No airspace consolidation.  IMPRESSION: 1. Cardiac enlargement.   Electronically Signed   By: Signa Kellaylor  Stroud M.D.   On: 05/12/2014 00:48    EKG: nodal escape rhythm, narrow complex, intermittent rate of ~40  Physical Exam: Blood pressure 133/58, pulse 48, temperature 97.5 F (36.4 C), temperature source Oral, resp. rate 18, SpO2 99.00%. General:  in no acute distress. Head: Normocephalic, atraumatic, sclera non-icteric, no xanthomas, nares are without discharge.  Neck: Supple. Negative for carotid bruits. JVD not elevated. Lungs: Clear bilaterally to auscultation without wheezes, rales, or rhonchi. Breathing is unlabored. Heart: irregular. No murmurs, rubs, or gallops appreciated. Abdomen: Soft, non-tender, non-distended with normoactive  bowel sounds. No hepatomegaly. No rebound/guarding. No obvious abdominal masses. Msk:  Strength and tone appear normal for age. Extremities: No clubbing or cyanosis. No edema.  Distal pedal pulses are 2+ and equal bilaterally. Neuro: Alert. Moves all extremities spontaneously. Psych:  Unable to test due to language barrier   Assessment and Plan:  Problem List 1. Bradycardia in the setting of hyperkalemia and beta blocker 2. Hyperkalemia due to ESRD, missing dialysis 3. HTN 4. CAD 5. Dementia 6. Psychosocial issues  73 y.o. male w/ PMHx significant for ESRD on HD, CAD, CHF (EF reportedly 45%), dementia,  who [facility]2014 with complaints of weakness. Found to be bradycardic, possibly symptomatic, requiring external pacing.  His junctional escape is due to the combination of beta blockers and his severe hyperkalemia. Beyond the acute treatment with D50/insulin/kayexalate, he needs dialysis. Continue to hold his beta blocker at this time.  Encouragingly, his complex is narrow and he appears to be tolerating the external pacing without significant distress. Reasonable to continue this. I turned the settings down to minimize the need for pacing (rate 50) and decreased pain (40 mA). No indication for temp wire at this point unless with dialysis and holding BB, brady fails to resolve.  Please call with questions. Will follow up to ensure resolution of bradycardia.  Addendum: transcut pacer turned down to 45 bpm. Underlying rhythm appears to be now sinus bradycardia at 50 bpm. BP of 140s/60s. My understanding is that urgent dialysis to commence soon. Continue to hold beta blocker.   Signed, Demaris Bousquet C. MD 05/12/2014, 1:15 AM

## 2014-05-12 NOTE — Care Management Note (Signed)
    Page 1 of 1   05/12/2014     8:06:17 AM CARE MANAGEMENT NOTE 05/12/2014  Patient:  HOSKIE, DELOE   Account Number:  [id number]  Date Initiated:  05/12/2014  Documentation initiated by:  Junius Creamer  Subjective/Objective Assessment:   adm w hyperkalemia     Action/Plan:   lives w wife, pcp dr Lelon Frohlich   Anticipated DC Date:     Anticipated DC Plan:           Choice offered to / List presented to:             Status of service:   Medicare Important Message given?   (If response is "NO", the following Medicare IM given date fields will be blank) Date Medicare IM given:   Medicare IM given by:   Date Additional Medicare IM given:   Additional Medicare IM given by:    Discharge Disposition:    Per UR Regulation:  Reviewed for med. necessity/level of care/duration of stay  If discussed at Long Length of Stay Meetings, dates discussed:    Comments:

## 2014-05-12 NOTE — H&P (Signed)
PULMONARY / CRITICAL CARE MEDICINE   Name: Albert HaberRene Grant MRN: [id number] DOB: 11/14/1940    ADMISSION DATE:  05/11/2014 CONSULTATION DATE:  05/12/2014  REFERRING MD :  EDP PRIMARY SERVICE: PCCM  CHIEF COMPLAINT:  Hyperkalemia  BRIEF PATIENT DESCRIPTION: 73 yo male ESRD on HD, CAD, DM [facility] after missing dialysis. In ED found to be hyperkalemic at 6.9. Was given Ca++, insulin, and bicarb. Also noted to be in junctional rhythm and has been placed on external pacer. Renal following planning to diurese tonight. PCCM asked to see for admission. Of note has refused dialysis several times recently saying he just wants to die.   SIGNIFICANT EVENTS / STUDIES:  7/2 > missed dialysis, hyperkalemia  LINES / TUBES: PIV  CULTURES: None  ANTIBIOTICS: None  HISTORY OF PRESENT ILLNESS:  73 year old male with PMH as below, which includes ESRD on HD, CAD, and DM. He refused dialysis 7/2, which [place] stating that he just wants to die. After missing dialysis he had an aleration in his mental status and [facility].  In ED found to be hyperkalemic at 6.9. Was given Ca++, insulin, and bicarb. Also noted to be in junctional rhythm and has been placed on external pacer. Renal following, planning to diurese tonight. PCCM asked to see for admission.  PAST MEDICAL HISTORY :  Past Medical History  Diagnosis Date  . 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 Hypertension   . CAD (coronary artery disease)      with angioplasty  . 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 Cardiomyopathy     with ejection fraction of 45 %  . Heart failure, systolic and diastolic   . Anemia   . CHF (congestive heart failure)    Past Surgical History  Procedure Laterality Date  . Spine surgery      spinal fusion  . Av fistula placement Left 01/16/2014    Procedure: ARTERIOVENOUS (AV) FISTULA CREATION; ULTRASOUND GUIDED;  Surgeon: Larina Earthlyodd F Early,  MD;  [facility];  Service: Vascular;  Laterality: Left;   Prior to Admission medications   Medication Sig Start Date End Date Taking? Authorizing Provider  AMBULATORY NON FORMULARY MEDICATION Lorazepam 1mg /ml Gel Sig: Apply 1ml topically twice daily as needed for anxiety 04/11/14   Kimber RelicArthur G Green, MD  Amino Acids-Protein Hydrolys (FEEDING SUPPLEMENT, PRO-STAT SUGAR FREE 64,) LIQD Take 30 mLs by mouth 2 (two) times daily. 10am and 10pm 04/11/14   Kela MillinAdeline C Viyuoh, MD  aspirin 81 MG tablet Take 81 mg by mouth daily.    Historical Provider, MD  atorvastatin (LIPITOR) 80 MG tablet Take 80 mg by mouth at bedtime. For hypercholesterolemia    Historical Provider, MD  B Complex-C (SUPER B COMPLEX) TABS Take 1 tablet by mouth daily.     Historical Provider, MD  calcium acetate (PHOSLO) 667 MG capsule Take 667-2,001 mg by mouth 4 (four) times daily - after meals and at bedtime. Take 3 capsules (2001 mg) 3 times daily with meals, take 1 capsule (667 mg) every night at bedtime with snack    Historical Provider, MD  carvedilol (COREG) 25 MG tablet Take 25 mg by mouth 2 (two) times daily with a meal. For HTN (10am & 10pm)    Historical Provider, MD  clopidogrel (PLAVIX) 75 MG tablet Take 75 mg by mouth daily.  Historical Provider, MD  ferrous sulfate 325 (65 FE) MG tablet Take 325 mg by mouth 3 (three) times daily with meals. 10am, 2pm, 10pm    Historical Provider, MD  guaifenesin (ROBITUSSIN) 100 MG/5ML syrup Take 200 mg by mouth every 4 (four) hours as needed for cough.    Historical Provider, MD  hydrALAZINE (APRESOLINE) 50 MG tablet Take 1 tablet (50 mg total) by mouth 3 (three) times daily. For HTN (10am, 2pm, 10pm) 04/11/14   Kela Millin, MD  hydrOXYzine (ATARAX/VISTARIL) 25 MG tablet Take 25 mg by mouth every 4 (four) hours as needed for anxiety.     Historical Provider, MD  insulin aspart (NOVOLOG) 100 UNIT/ML injection Inject 0-9 Units into the skin 3 (three) times daily with meals. 03/20/14    [name], MD  insulin aspart (NOVOLOG) 100 UNIT/ML injection Inject 2 Units into the skin 3 (three) times daily before meals.    Historical Provider, MD  insulin glargine (LANTUS) 100 UNIT/ML injection Inject 12 Units into the skin at bedtime.    Historical Provider, MD  isosorbide mononitrate (IMDUR) 60 MG 24 hr tablet Take 60 mg by mouth daily. For HTN    Historical Provider, MD  mirtazapine (REMERON) 15 MG tablet Take 15 mg by mouth at bedtime.    Historical Provider, MD  nitroGLYCERIN (NITROSTAT) 0.4 MG SL tablet Place 0.4 mg under the tongue every 5 (five) minutes as needed for chest pain (May repeat for 3 doses as needed for chest pain. If chest pain persist call the MD.).    Historical Provider, MD  Nutritional Supplements (FEEDING SUPPLEMENT, NEPRO CARB STEADY,) LIQD Take 237 mLs by mouth as needed (missed meal during dialysis.). 04/11/14   Kela Millin, MD  ondansetron (ZOFRAN) 4 MG tablet Take 4 mg by mouth every 8 (eight) hours as needed for nausea or vomiting.    Historical Provider, MD  oxyCODONE-acetaminophen (PERCOCET/ROXICET) 5-325 MG per tablet Take one tablet by mouth every 6 hours as needed for severe pain 04/12/14   Claudie Revering, NP  pantoprazole (PROTONIX) 40 MG tablet Take 40 mg by mouth 2 (two) times daily. For esophageal reflux (6:30am & 4:30pm)    Historical Provider, MD  PRESCRIPTION MEDICATION Take 1 packet by mouth daily. Novosource (supplement)    Historical Provider, MD  sertraline (ZOLOFT) 100 MG tablet Take 100 mg by mouth daily. For mood    Historical Provider, MD   Allergies  Allergen Reactions  . Lisinopril Other (See Comments)    Per MAR    FAMILY HISTORY:  History reviewed. No pertinent family history. SOCIAL HISTORY:  reports that he has never smoked. He has never used smokeless tobacco. He reports that he does not drink alcohol or use illicit drugs.  REVIEW OF SYSTEMS:  Unable due to lethargy   SUBJECTIVE:   VITAL SIGNS: Temp:  [97.5 F (36.4  C)] 97.5 F (36.4 C) (07/03 0009) Pulse Rate:  [48] 48 (07/03 0009) Resp:  [18] 18 (07/03 0009) BP: (133)/(58) 133/58 mmHg (07/03 0009) SpO2:  [99 %] 99 % (07/03 0009) HEMODYNAMICS:   VENTILATOR SETTINGS:   INTAKE / OUTPUT: Intake/Output   None     PHYSICAL EXAMINATION: General:  Chronically ill appearing male in NAD Neuro:  Spontaneously awake, alert to self. Follows mimic type commands. No focal deficits. HEENT:  [place]/AT, no JVD noted Cardiovascular:  RRR, sinus on monitor, external pacer pads in place Lungs:  Clear bilateral lung fields.  Abdomen:  Soft, non-disteneded Musculoskeletal:  No acute deformity. Moves all extremities.  Skin:  Intact  LABS:  CBC  Recent Labs Lab 05/11/14 2348 05/11/14 2354  WBC 6.5  --   HGB 9.9* 10.5*  HCT 30.2* 31.0*  PLT 137*  --    Coag's No results found for this basename: APTT, INR,  in the last 168 hours BMET  Recent Labs Lab 05/11/14 2348 05/11/14 2354  NA 132* 129*  K 7.0* 6.9*  CL 90* 100  CO2 19  --   BUN 119* 136*  CREATININE 11.72* 12.80*  GLUCOSE 225* 230*   Electrolytes  Recent Labs Lab 05/11/14 2348 05/12/14 0022  CALCIUM 7.7*  --   MG  --  2.8*  PHOS  --  12.2*   Sepsis Markers  Recent Labs Lab 05/11/14 2357  LATICACIDVEN 1.11   ABG No results found for this basename: PHART, PCO2ART, PO2ART,  in the last 168 hours Liver Enzymes  Recent Labs Lab 05/11/14 2348  AST 200*  ALT 101*  ALKPHOS 194*  BILITOT 0.5  ALBUMIN 2.9*   Cardiac Enzymes No results found for this basename: TROPONINI, PROBNP,  in the last 168 hours Glucose  Recent Labs Lab 05/11/14 2347  GLUCAP 209*    Imaging Dg Chest Port 1 View  05/12/2014   CLINICAL DATA:  Weakness.  EXAM: PORTABLE CHEST - 1 VIEW  COMPARISON:  04/05/2014  FINDINGS: Moderate cardiac enlargement. No pleural effusion or edema. No airspace consolidation.  IMPRESSION: 1. Cardiac enlargement.   Electronically Signed   By: Signa Kell M.D.   On:  05/12/2014 00:48    ASSESSMENT / PLAN:  PULMONARY A: No acute issues  P:   Supplemental O2 as needed to maintain SpO2 > 92% Follow CXR intermittently  CARDIOVASCULAR A:  Junctional escape rhythm  Now sinus brady on monitor, no obvious T wave peaks.  HTN CHF (EF reportedly 45%)  P:  Continue transcutaneous pacing until able to receive HD Continue home BP medications when can take PO  RENAL A:   Acute on CKD  Hyperkalemia  Received insulin, Ca++, Bicarb in ED Metabolic acidosis likely uremic  P:   Nephrology following. To diurese once he gets to ICU STAT BMP to assess effect of temporizing HyperK therapies.  Follow BMP daily.   GASTROINTESTINAL A:   H/o GERD Elevated transaminases  P:   Continue home PPI. NPO for now until more awake. Trend LFT, add ammonia if does not resolve after dialysis.  HEMATOLOGIC A:   Anemia of chronic illness Thrombocytopenia VTE ppx  P:  SCD's Follow CBC  INFECTIOUS A:   No acute issues  P:   Monitor clinically  ENDOCRINE A:   DM  P:   CBG and SSI  NEUROLOGIC A:   Acute encephalopathy in setting of uremia/acidemia  P:   Monitor Does not speak english, please involve interpreter or language phone  Global:  Given story, likely would benefit from goals of care discussion with pt and family together. Sounds as though he would not want to continue dialysis or aggressive care.  Will need interpreter or Secondary school teacher. ([place]   Joneen Roach, ACNP [place] Pulmonology/Critical Care Pager [phone] or [phone]  Will admit to the ICU, continue external pacer, anticipate that patient will normalize after K is addressed.  Continue home PO medications and keep under close observation until dialyzed.  I have personally obtained a history, examined the patient, evaluated laboratory and imaging results, formulated the assessment and plan and placed orders.  CRITICAL CARE: The patient is  critically ill with multiple organ systems failure and requires high complexity decision making for assessment and support, frequent evaluation and titration of therapies, application of advanced monitoring technologies and extensive interpretation of multiple databases. Critical Care Time devoted to patient care services described in this note is 40 minutes.   Alyson Reedy, M.D. [facility] Pulmonary/Critical Care Medicine. Pager: [phone]. After hours pager: [phone].

## 2014-05-13 ENCOUNTER — Inpatient Hospital Stay (HOSPITAL_COMMUNITY): Payer: Medicare Other

## 2014-05-13 DIAGNOSIS — I10 Essential (primary) hypertension: Secondary | ICD-10-CM

## 2014-05-13 DIAGNOSIS — F3289 Other specified depressive episodes: Secondary | ICD-10-CM

## 2014-05-13 DIAGNOSIS — I498 Other specified cardiac arrhythmias: Secondary | ICD-10-CM

## 2014-05-13 DIAGNOSIS — F329 Major depressive disorder, single episode, unspecified: Secondary | ICD-10-CM

## 2014-05-13 DIAGNOSIS — E1129 Type 2 diabetes mellitus with other diabetic kidney complication: Secondary | ICD-10-CM

## 2014-05-13 DIAGNOSIS — Z91199 Patient's noncompliance with other medical treatment and regimen due to unspecified reason: Secondary | ICD-10-CM

## 2014-05-13 DIAGNOSIS — E440 Moderate protein-calorie malnutrition: Secondary | ICD-10-CM

## 2014-05-13 DIAGNOSIS — G9340 Encephalopathy, unspecified: Secondary | ICD-10-CM

## 2014-05-13 DIAGNOSIS — I509 Heart failure, unspecified: Secondary | ICD-10-CM

## 2014-05-13 DIAGNOSIS — Z9119 Patient's noncompliance with other medical treatment and regimen: Secondary | ICD-10-CM

## 2014-05-13 DIAGNOSIS — E875 Hyperkalemia: Principal | ICD-10-CM

## 2014-05-13 DIAGNOSIS — N189 Chronic kidney disease, unspecified: Secondary | ICD-10-CM

## 2014-05-13 DIAGNOSIS — I110 Hypertensive heart disease with heart failure: Secondary | ICD-10-CM

## 2014-05-13 DIAGNOSIS — N186 End stage renal disease: Secondary | ICD-10-CM

## 2014-05-13 DIAGNOSIS — Z992 Dependence on renal dialysis: Secondary | ICD-10-CM

## 2014-05-13 DIAGNOSIS — I2589 Other forms of chronic ischemic heart disease: Secondary | ICD-10-CM

## 2014-05-13 LAB — BASIC METABOLIC PANEL
Anion gap: 23 — ABNORMAL HIGH (ref 5–15)
BUN: 52 mg/dL — ABNORMAL HIGH (ref 6–23)
CALCIUM: 8.4 mg/dL (ref 8.4–10.5)
CO2: 21 mEq/L (ref 19–32)
Chloride: 95 mEq/L — ABNORMAL LOW (ref 96–112)
Creatinine, Ser: 6.99 mg/dL — ABNORMAL HIGH (ref 0.50–1.35)
GFR calc Af Amer: 8 mL/min — ABNORMAL LOW (ref >90)
GFR, EST NON AFRICAN AMERICAN: 7 mL/min — AB (ref >90)
GLUCOSE: 59 mg/dL — AB (ref 70–99)
POTASSIUM: 4.8 meq/L (ref 3.7–5.3)
SODIUM: 139 meq/L (ref 137–147)

## 2014-05-13 LAB — CBC WITH DIFFERENTIAL/PLATELET
BASOS ABS: 0 10*3/uL (ref 0.0–0.1)
Basophils Relative: 0 % (ref 0–1)
EOS PCT: 0 % (ref 0–5)
Eosinophils Absolute: 0 10*3/uL (ref 0.0–0.7)
HCT: 38.2 % — ABNORMAL LOW (ref 39.0–52.0)
Hemoglobin: 12.4 g/dL — ABNORMAL LOW (ref 13.0–17.0)
LYMPHS ABS: 0.5 10*3/uL — AB (ref 0.7–4.0)
LYMPHS PCT: 8 % — AB (ref 12–46)
MCH: 32.4 pg (ref 26.0–34.0)
MCHC: 32.5 g/dL (ref 30.0–36.0)
MCV: 99.7 fL (ref 78.0–100.0)
Monocytes Absolute: 0.5 10*3/uL (ref 0.1–1.0)
Monocytes Relative: 7 % (ref 3–12)
NEUTROS ABS: 5.3 10*3/uL (ref 1.7–7.7)
Neutrophils Relative %: 84 % — ABNORMAL HIGH (ref 43–77)
PLATELETS: 235 10*3/uL (ref 150–400)
RBC: 3.83 MIL/uL — AB (ref 4.22–5.81)
RDW: 13.8 % (ref 11.5–15.5)
WBC: 6.3 10*3/uL (ref 4.0–10.5)

## 2014-05-13 LAB — GLUCOSE, CAPILLARY
GLUCOSE-CAPILLARY: 70 mg/dL (ref 70–99)
GLUCOSE-CAPILLARY: 60 mg/dL — AB (ref 70–99)
GLUCOSE-CAPILLARY: 109 mg/dL — AB (ref 70–99)
GLUCOSE-CAPILLARY: 276 mg/dL — AB (ref 70–99)
Glucose-Capillary: 95 mg/dL (ref 70–99)

## 2014-05-13 MED ORDER — INSULIN ASPART 100 UNIT/ML ~~LOC~~ SOLN
0.0000 [IU] | SUBCUTANEOUS | Status: DC
  Administered 2014-05-13: 8 [IU] via SUBCUTANEOUS
  Administered 2014-05-14: 2 [IU] via SUBCUTANEOUS
  Administered 2014-05-14: 8 [IU] via SUBCUTANEOUS
  Administered 2014-05-14: 2 [IU] via SUBCUTANEOUS
  Administered 2014-05-15: 3 [IU] via SUBCUTANEOUS
  Administered 2014-05-15: 8 [IU] via SUBCUTANEOUS
  Administered 2014-05-15: 3 [IU] via SUBCUTANEOUS
  Administered 2014-05-15: 2 [IU] via SUBCUTANEOUS
  Administered 2014-05-15: 5 [IU] via SUBCUTANEOUS
  Administered 2014-05-16: 3 [IU] via SUBCUTANEOUS
  Administered 2014-05-16: 2 [IU] via SUBCUTANEOUS
  Administered 2014-05-16: 15 [IU] via SUBCUTANEOUS
  Administered 2014-05-16: 2 [IU] via SUBCUTANEOUS
  Administered 2014-05-17: 3 [IU] via SUBCUTANEOUS

## 2014-05-13 MED ORDER — DEXTROSE 50 % IV SOLN
25.0000 mL | Freq: Once | INTRAVENOUS | Status: AC | PRN
  Administered 2014-05-13: 25 mL via INTRAVENOUS

## 2014-05-13 MED ORDER — PANTOPRAZOLE SODIUM 40 MG PO TBEC
40.0000 mg | DELAYED_RELEASE_TABLET | Freq: Every day | ORAL | Status: DC
  Administered 2014-05-14 – 2014-05-19 (×6): 40 mg via ORAL
  Filled 2014-05-13 (×4): qty 1

## 2014-05-13 MED ORDER — HYDRALAZINE HCL 50 MG PO TABS
50.0000 mg | ORAL_TABLET | Freq: Three times a day (TID) | ORAL | Status: DC
  Filled 2014-05-13 (×4): qty 1

## 2014-05-13 MED ORDER — HYDRALAZINE HCL 50 MG PO TABS
50.0000 mg | ORAL_TABLET | Freq: Three times a day (TID) | ORAL | Status: DC
  Administered 2014-05-13 – 2014-05-19 (×13): 50 mg via ORAL
  Filled 2014-05-13 (×23): qty 1

## 2014-05-13 NOTE — Progress Notes (Signed)
[place] TEAM 1 - Stepdown/ICU TEAM Progress Note  Albert Grant ZOX:[id number] DOB: 10-17-1941 DOA: 05/11/2014 PCP: Karlene Einstein, MD  Admit HPI / Brief Narrative: 73 yo AM PMHc ESRD on HD, CAD, DM presents to [facility] ED after missing dialysis. In ED found to be hyperkalemic at 6.9. Was given Ca++, insulin, and bicarb. Also noted to be in junctional rhythm and has been placed on external pacer. Renal following planning to diurese tonight. PCCM asked to see for admission. Of note has refused dialysis several times recently saying he just wants to die.    HPI/Subjective: 7/4A/O. x4, no complaints  Assessment/Plan: ESRD T/TH/Sat  - TTS [facility]. His noncompliance has been a recurring issue. He signs off after 2 hours of dialysis on Tuesday and did not go on Thursday.   Life threatening hyperkalemia  - 3rd admission in 2 months for refusing diaysis, then being transported here for emergent dialysis. His primary neph's in [place] need to be involved in the discussion of best plan of action for this man when he refuses treatment. -Psychiatry consult in the a.m. is patient competent? If so patient has the right to refuse treatment.  Anemia  - no epo due to Hgb >12  DM  -Patient's CBG on the low side, with some hypoglycemic episodes -DC Lantus 12 units -During hospitalization control with moderate SSI  Encephalopathy  - Clearing post dialysis   Disposition -Psych consult in the a.m. evaluate for competency. If competent patient can refuse treatment, and family must abide by his wishes. -If competent reinvolve palliative care to meet with family and explain medical legal issues.  Noncompliance with medical care -See above discussion     Code Status: FULL Family Communication: no family present at time of exam Disposition Plan: ??    Consultants: Dr. Terrial Rhodes (nephrology) Dr. Shan Levans (PCCM) Dr. Olga Millers  (cardiology)  Procedure/Significant Events: 7/2 > missed dialysis, hyperkalemia    Culture NA  Antibiotics: NA  DVT prophylaxis: SCD, heparin during dialysis   Devices NA   LINES / TUBES:      Continuous Infusions:   Objective: VITAL SIGNS: Temp: 97.3 F (36.3 C) (07/04 1550) Temp src: Oral (07/04 1550) BP: 113/50 mmHg (07/04 1550) Pulse Rate: 83 (07/04 1550) SPO2; FIO2:   Intake/Output Summary (Last 24 hours) at 05/13/14 1848 Last data filed at 05/13/14 1831  Gross per 24 hour  Intake    490 ml  Output    850 ml  Net   -360 ml     Exam: General: A./O. x4, NAD, No acute respiratory distress Lungs: Clear to auscultation bilaterally without wheezes or crackles Cardiovascular: Regular rate and rhythm without murmur gallop or rub normal S1 and S2 Abdomen: Nontender, nondistended, soft, bowel sounds positive, no rebound, no ascites, no appreciable mass Extremities: No significant cyanosis, clubbing, or edema bilateral lower extremities  Data Reviewed: Basic Metabolic Panel:  Recent Labs Lab 05/11/14 2348 05/11/14 2354 05/12/14 0022 05/12/14 0244 05/12/14 0510 05/12/14 1042 05/13/14 0258  NA 132* 129*  --  133*  --  139 139  K 7.0* 6.9*  --  6.4* 5.9* 3.6* 4.8  CL 90* 100  --  91*  --  97 95*  CO2 19  --   --  18*  --  28 21  GLUCOSE 225* 230*  --  138*  --  53* 59*  BUN 119* 136*  --  122*  --  35* 52*  CREATININE 11.72* 12.80*  --  11.89*  --  4.95* 6.99*  CALCIUM 7.7*  --   --  9.0  --  8.6 8.4  MG  --   --  2.8*  --   --   --   --   PHOS  --   --  12.2*  --   --   --   --    Liver Function Tests:  Recent Labs Lab 05/11/14 2348  AST 200*  ALT 101*  ALKPHOS 194*  BILITOT 0.5  PROT 6.6  ALBUMIN 2.9*   No results found for this basename: LIPASE, AMYLASE,  in the last 168 hours No results found for this basename: AMMONIA,  in the last 168 hours CBC:  Recent Labs Lab 05/11/14 2348 05/11/14 2354 05/13/14 0258  WBC 6.5  --  6.3   NEUTROABS 5.6  --  5.3  HGB 9.9* 10.5* 12.4*  HCT 30.2* 31.0* 38.2*  MCV 98.4  --  99.7  PLT 137*  --  235   Cardiac Enzymes: No results found for this basename: CKTOTAL, CKMB, CKMBINDEX, TROPONINI,  in the last 168 hours BNP (last 3 results) No results found for this basename: PROBNP,  in the last 8760 hours CBG:  Recent Labs Lab 05/12/14 2231 05/13/14 0106 05/13/14 0616 05/13/14 0636 05/13/14 0805  GLUCAP 84 70 60* 109* 95    Recent Results (from the past 240 hour(s))  MRSA PCR SCREENING     Status: None   Collection Time    05/12/14  3:54 AM      Result Value Ref Range Status   MRSA by PCR NEGATIVE  NEGATIVE Final   Comment:            The GeneXpert MRSA Assay (FDA     approved for NASAL specimens     only), is one component of a     comprehensive MRSA colonization     surveillance program. It is not     intended to diagnose MRSA     infection nor to guide or     monitor treatment for     MRSA infections.     Studies:  Recent x-ray studies have been reviewed in detail by the Attending Physician  Scheduled Meds:  Scheduled Meds: . amLODipine  10 mg Oral Daily  . aspirin  81 mg Oral Daily  . atorvastatin  80 mg Oral QHS  . calcium acetate  2,001 mg Oral TID WC  . calcium acetate  667 mg Oral QHS  . clopidogrel  75 mg Oral Daily  . ferrous sulfate  325 mg Oral TID WC  . hydrALAZINE  50 mg Oral 3 times per day  . insulin aspart  0-15 Units Subcutaneous 6 times per day  . isosorbide mononitrate  60 mg Oral Daily  . mirtazapine  15 mg Oral QHS  . [START ON 05/14/2014] pantoprazole  40 mg Oral Q1200  . sertraline  100 mg Oral Daily    Time spent on care of this patient: 40 mins   Drema DallasWOODS, Lindell Renfrew, J , MD   [facility] Office  [phone] Pager - [phone]  On-Call/Text Page:      [email or web address]      password [email or web address]  If 7PM-7AM, please contact night-coverage [email or web address] Password [email or web address] 05/13/2014, 6:48 PM   LOS: 2 days

## 2014-05-13 NOTE — Progress Notes (Signed)
Pt transferred from [facility] to [phone] via dialysis by stretcher.  VSS.  Pt does not speak much english.  Pt is [place] Denies pain.

## 2014-05-13 NOTE — Progress Notes (Signed)
    Subjective:  Shakes head no to questions; somnelent   Objective:  Filed Vitals:   05/13/14 0400 05/13/14 0500 05/13/14 0600 05/13/14 0700  BP: 133/83 125/54 93/47 118/72  Pulse: 90 79 81 79  Temp:  98.5 F (36.9 C)    TempSrc:  Oral    Resp: 26 0 11 11  Height:      Weight:  139 lb 12.4 oz (63.4 kg)    SpO2: 93% 92% 93% 95%    Intake/Output from previous day:  Intake/Output Summary (Last 24 hours) at 05/13/14 0704 Last data filed at 05/12/14 0835  Gross per 24 hour  Intake      0 ml  Output   3000 ml  Net  -3000 ml    Physical Exam: Physical exam:  Well-developed well-nourished in no acute distress.  Skin is warm and dry.  HEENT is normal.  Neck is supple.  Chest is clear to auscultation with normal expansion.  Cardiovascular exam is regular rate and rhythm.  Abdominal exam nontender or distended. No masses palpated. Extremities show no edema.     Lab Results: Basic Metabolic Panel:  Recent Labs  62/37/62 0022  05/12/14 1042 05/13/14 0258  NA  --   < > 139 139  K  --   < > 3.6* 4.8  CL  --   < > 97 95*  CO2  --   < > 28 21  GLUCOSE  --   < > 53* 59*  BUN  --   < > 35* 52*  CREATININE  --   < > 4.95* 6.99*  CALCIUM  --   < > 8.6 8.4  MG 2.8*  --   --   --   PHOS 12.2*  --   --   --   < > = values in this interval not displayed. CBC:  Recent Labs  05/11/14 2348 05/11/14 2354 05/13/14 0258  WBC 6.5  --  6.3  NEUTROABS 5.6  --  5.3  HGB 9.9* 10.5* 12.4*  HCT 30.2* 31.0* 38.2*  MCV 98.4  --  99.7  PLT 137*  --  235     Assessment/Plan:  1 bradycardia-related to hyperkalemia in combination with beta-blockade now resolved. His heart rate remains in sinus in the 80s. Would avoid clonidine and beta-blockade in the future. 2 hypertension-his blood pressure is improved. Continue hydralazine/nitrate. Would avoid AV nodal blocking agents. 3 end-stage renal disease-dialysis per nephrology. 4 Dementia 5 coronary artery disease-continue aspirin  and statin. 6 DM-hypoglycemic this AM; management per CCM. We will sign off; please call with questions. Olga Millers 05/13/2014, 7:04 AM

## 2014-05-13 NOTE — Progress Notes (Signed)
Report to Inetta Fermo, RN 6 [place]; pt transferred to hemodialysis by dialysis RN after report given; pt belongings transferred to floor by EDT; questions encouraged and answered

## 2014-05-13 NOTE — Progress Notes (Signed)
Patient at first refused all night time medications. Was able to get patient to take one medication (hydralazine).

## 2014-05-13 NOTE — Progress Notes (Signed)
Hypoglycemic Event  CBG: 60  Treatment: D50 IV 25 mL  Symptoms: lethargic, diaphoretic  Follow-up CBG: Time:0630 CBG Result:109  Possible Reasons for Event: Other: Pt NPO     Albert Grant M  Remember to initiate Hypoglycemia Order Set & complete

## 2014-05-13 NOTE — Progress Notes (Addendum)
PULMONARY / CRITICAL CARE MEDICINE   Name: Albert HaberRene Grant MRN: [id number] DOB: 12/17/1940    ADMISSION DATE:  05/11/2014 CONSULTATION DATE:  05/12/2014  REFERRING MD :  EDP PRIMARY SERVICE: PCCM  CHIEF COMPLAINT:  Hyperkalemia  BRIEF PATIENT DESCRIPTION: 73 yo male ESRD on HD, CAD, DM [facility] dialysis. In ED found to be hyperkalemic at 6.9. Was given Ca++, insulin, and bicarb. Also noted to be in junctional rhythm and has been placed on external pacer. Renal following planning to diurese tonight. PCCM asked to see for admission. Of note has refused dialysis several times recently saying he just wants to die.   SIGNIFICANT EVENTS / STUDIES:  7/2 > missed dialysis, hyperkalemia  LINES / TUBES: PIV  CULTURES: None  ANTIBIOTICS: None   SUBJECTIVE:  Stable , no longer paced. K better with HD   VITAL SIGNS: Temp:  [97.7 F (36.5 C)-98.5 F (36.9 C)] 98.5 F (36.9 C) (07/04 0700) Pulse Rate:  [78-90] 79 (07/04 0700) Resp:  [0-26] 11 (07/04 0700) BP: (93-204)/(39-94) 118/72 mmHg (07/04 0700) SpO2:  [85 %-100 %] 95 % (07/04 0700) Weight:  [62.2 kg (137 lb 2 oz)-63.4 kg (139 lb 12.4 oz)] 63.4 kg (139 lb 12.4 oz) (07/04 0500)       INTAKE / OUTPUT: Intake/Output     07/03 0701 - 07/04 0700 07/04 0701 - 07/05 0700   Other 3000    Total Output 3000     Net -3000            PHYSICAL EXAMINATION: General:  Chronically ill appearing male in NAD Neuro:  Spontaneously awake, alert to self. Follows mimic type commands. No focal deficits. HEENT:  [place]/AT, no JVD noted Cardiovascular:  RRR, sinus on monitor Lungs:  Clear bilateral lung fields.  Abdomen:  Soft, non-disteneded Musculoskeletal:  No acute deformity. Moves all extremities.  Skin:  Intact  LABS:  CBC  Recent Labs Lab 05/11/14 2348 05/11/14 2354 05/13/14 0258  WBC 6.5  --  6.3  HGB 9.9* 10.5* 12.4*  HCT 30.2* 31.0* 38.2*  PLT 137*  --  235   Coag's No results found for this basename:  APTT, INR,  in the last 168 hours BMET  Recent Labs Lab 05/12/14 0244 05/12/14 0510 05/12/14 1042 05/13/14 0258  NA 133*  --  139 139  K 6.4* 5.9* 3.6* 4.8  CL 91*  --  97 95*  CO2 18*  --  28 21  BUN 122*  --  35* 52*  CREATININE 11.89*  --  4.95* 6.99*  GLUCOSE 138*  --  53* 59*   Electrolytes  Recent Labs Lab 05/12/14 0022 05/12/14 0244 05/12/14 1042 05/13/14 0258  CALCIUM  --  9.0 8.6 8.4  MG 2.8*  --   --   --   PHOS 12.2*  --   --   --    Sepsis Markers  Recent Labs Lab 05/11/14 2357  LATICACIDVEN 1.11   ABG No results found for this basename: PHART, PCO2ART, PO2ART,  in the last 168 hours Liver Enzymes  Recent Labs Lab 05/11/14 2348  AST 200*  ALT 101*  ALKPHOS 194*  BILITOT 0.5  ALBUMIN 2.9*   Cardiac Enzymes No results found for this basename: TROPONINI, PROBNP,  in the last 168 hours Glucose  Recent Labs Lab 05/12/14 0840 05/12/14 2156 05/12/14 2231 05/13/14 0106 05/13/14 0616 05/13/14 0636  GLUCAP 90 43* 84 70 60* 109*    [facility]  [facility]  05/12/2014   CLINICAL DATA:  Weakness.  EXAM: PORTABLE CHEST - 1 VIEW  COMPARISON:  04/05/2014  FINDINGS: Moderate cardiac enlargement. No pleural effusion or edema. No airspace consolidation.  IMPRESSION: 1. Cardiac enlargement.   Electronically Signed   By: Signa Kell M.D.   On: 05/12/2014 00:48    ASSESSMENT / PLAN: Principal Problem:   Hyperkalemia Active Problems:   Cardiomyopathy, ischemic   DM type 2 causing renal disease   Hypertensive heart disease   Acute encephalopathy   Malnutrition of moderate degree   Chronic systolic heart failure   Bradycardia   Hypertension   ESRD (end stage renal disease) on dialysis   PULMONARY A: No acute issues  P:   Monitor off oxygen  CARDIOVASCULAR A:  Junctional escape rhythm  Resolved Recurrent bradycardia. Resolved. Keep off coreg permanently HTN controlled CHF (EF reportedly 45%)  P:  Cont po BP meds No further  coreg IV hydralazine prn   RENAL A:   Acute on CKD  Hyperkalemia resolved  Metabolic acidosis likely uremic resolved   P:   Nephrology following.  HD per renal    GASTROINTESTINAL A:   H/o GERD Elevated transaminases  P:   Continue home PPI. Advance diet   HEMATOLOGIC A:   Anemia of chronic illness Thrombocytopenia VTE ppx  P:  Hep sq Follow CBC  INFECTIOUS A:   No acute issues  P:   Monitor clinically  ENDOCRINE A:   DM 2 with renal complications  P:   CBG and SSI lantus  NEUROLOGIC A:   Acute encephalopathy in setting of uremia/acidemia Improved am 7/4  P:   Monitor Does not speak english, please involve interpreter or language phone  Global:  Adv diet, tfr to floor renal, ask trh to assume care. Big issue is pt refusal of HD in SNF but pt full code based on pall care consult last ov Also NO MORE BETA BLOCKERS PER CARDS  I have personally obtained a history, examined the patient, evaluated laboratory and imaging results, formulated the assessment and plan and placed orders.   Dorcas Carrow Beeper  [phone]  Cell  [phone]  If no response or cell goes to voicemail, call beeper [phone]  [facility] Pulmonary/Critical Care Medicine. After hours pager: [phone].  7:42 AM 05/13/2014

## 2014-05-13 NOTE — Progress Notes (Signed)
Patient ID: Albert Grant, male   DOB: 11/10/1941, 73 y.o.   MRN: [id number]  [facility] Progress Note    Subjective:   No complaints and doesn't know why he is here   Objective:   BP 118/72  Pulse 79  Temp(Src) 98.5 F (36.9 C) (Oral)  Resp 11  Ht 5\' 5"  (1.651 m)  Wt 63.4 kg (139 lb 12.4 oz)  BMI 23.26 kg/m2  SpO2 95%  Intake/Output: I/O last 3 completed shifts: In: -  Out: 3000 [Other:3000]   Intake/Output this shift:    Weight change: -3 kg (-6 lb 9.8 oz)  Physical Exam: Gen:WD elderly asian male in NAD CVS:no rub Resp:cta TZG:YFVCBS Ext:no edema, LUE AVF +T/B  Labs: BMET  Recent Labs Lab 05/11/14 2348 05/11/14 2354 05/12/14 0022 05/12/14 0244 05/12/14 0510 05/12/14 1042 05/13/14 0258  NA 132* 129*  --  133*  --  139 139  K 7.0* 6.9*  --  6.4* 5.9* 3.6* 4.8  CL 90* 100  --  91*  --  97 95*  CO2 19  --   --  18*  --  28 21  GLUCOSE 225* 230*  --  138*  --  53* 59*  BUN 119* 136*  --  122*  --  35* 52*  CREATININE 11.72* 12.80*  --  11.89*  --  4.95* 6.99*  ALBUMIN 2.9*  --   --   --   --   --   --   CALCIUM 7.7*  --   --  9.0  --  8.6 8.4  PHOS  --   --  12.2*  --   --   --   --    CBC  Recent Labs Lab 05/11/14 2348 05/11/14 2354 05/13/14 0258  WBC 6.5  --  6.3  NEUTROABS 5.6  --  5.3  HGB 9.9* 10.5* 12.4*  HCT 30.2* 31.0* 38.2*  MCV 98.4  --  99.7  PLT 137*  --  235    @IMGRELPRIORS @ Medications:    . amLODipine  10 mg Oral Daily  . aspirin  81 mg Oral Daily  . atorvastatin  80 mg Oral QHS  . calcium acetate  2,001 mg Oral TID WC  . calcium acetate  667 mg Oral QHS  . clopidogrel  75 mg Oral Daily  . ferrous sulfate  325 mg Oral TID WC  . hydrALAZINE  50 mg Oral 3 times per day  . insulin glargine  12 Units Subcutaneous QHS  . isosorbide mononitrate  60 mg Oral Daily  . mirtazapine  15 mg Oral QHS  . pantoprazole (PROTONIX) IV  40 mg Intravenous Q24H  . sertraline  100 mg Oral Daily  . sodium polystyrene  60 g  Oral Once   HD prescription at [facility] [phone]):  TTS, 3:15 time, bfr400/dfr800, 2K/2.5Ca, EDW 60.5kg, No heparin, venofer 50mg  qweek, epo 4500 units IVP tiw   Impression/Plan  1. ESRD - TTS [facility].  His noncompliance has been a recurring issue.  He signs off after 2 hours of dialysis on Tuesday and did not go on Thursday.   2. Life threatening hyperkalemia - 3rd admission in 2 months for refusing diaysis, then being transported here for emergent dialysis. His primary neph's in  need to be involved in the discussion of best plan of action for this man when he refuses treatment. 3. Anemia - no epo due to Hgb >12 4. DM -  primary service 5. Encephalopathy -  Numbers much worse than would expect for a single missed treatment if otherwise well dialyzed... 6. Disposition- Doubt his competency and therefore he should not be allowed to refuse and the nursing home and/or family should be held responsible for transporting him to dialysis three days a week to prevent these life threatening episodes.  If he refuses dialysis while he is an inpt would recommend hospice despite last palliative care meeting or transfer him to [place] so his primary nephrology team can discuss this further with family and pt.  Carron Mcmurry A 05/13/2014, 10:08 AM

## 2014-05-14 DIAGNOSIS — D631 Anemia in chronic kidney disease: Secondary | ICD-10-CM

## 2014-05-14 DIAGNOSIS — N039 Chronic nephritic syndrome with unspecified morphologic changes: Secondary | ICD-10-CM

## 2014-05-14 DIAGNOSIS — F332 Major depressive disorder, recurrent severe without psychotic features: Secondary | ICD-10-CM

## 2014-05-14 LAB — GLUCOSE, CAPILLARY
GLUCOSE-CAPILLARY: 134 mg/dL — AB (ref 70–99)
Glucose-Capillary: 134 mg/dL — ABNORMAL HIGH (ref 70–99)
Glucose-Capillary: 168 mg/dL — ABNORMAL HIGH (ref 70–99)
Glucose-Capillary: 265 mg/dL — ABNORMAL HIGH (ref 70–99)
Glucose-Capillary: 95 mg/dL (ref 70–99)

## 2014-05-14 MED ORDER — BUPROPION HCL ER (XL) 150 MG PO TB24
150.0000 mg | ORAL_TABLET | Freq: Every day | ORAL | Status: DC
  Administered 2014-05-15 – 2014-05-19 (×5): 150 mg via ORAL
  Filled 2014-05-14 (×6): qty 1

## 2014-05-14 NOTE — Progress Notes (Signed)
Progress Note  Albert Grant TGY:[id number] DOB: December 09, 1940 DOA: 05/11/2014 PCP: Karlene Einstein, MD  Admit HPI / Brief Narrative: 73 yo AM PMHc ESRD on HD, CAD, DM presents to [facility] ED after missing dialysis. In ED found to be hyperkalemic at 6.9. Was given Ca++, insulin, and bicarb. Also noted to be in junctional rhythm and has been placed on external pacer. Renal following planning to diurese tonight. PCCM asked to see for admission. Of note has refused dialysis several times recently saying he just wants to die.    HPI/Subjective: A/O. x4, currently without any acute complaints. Patient will like to talk with family and has been in agreement to discussed with psychiatry service the situation.  Assessment/Plan: ESRD T/TH/Sat  - TTS [facility]. His noncompliance has been a recurring issue. He signs off after 2 hours of dialysis on Tuesday and did not go on Thursday; ended admitted to the hospital with hyperkalemia and fluid overload. -will follow renal service rec's -electrolytes now corrected -patient w/o complaints -psych consult for competency requested  Life threatening hyperkalemia  - 3rd admission in 2 months for refusing diaysis, then being transported here for emergent dialysis. His primary neph's in [place] need to be involved in the discussion of best plan of action for this man when he refuses treatment. -Psychiatry consult ordered -base on results will address situation with family and involve PC  Anemia  -epo/iron as per renal descretion  DM  -Patient's CBG on the low side, with some hypoglycemic episodes -continue moderate SSI  Encephalopathy -pretty much clear after HD -most likely related to uremia   Noncompliance with medical care -See above discussion     Code Status: FULL Family Communication: no family present at time of exam Disposition Plan: to be determine  Consultants: Dr. Terrial Rhodes (nephrology) Dr. Shan Levans  (PCCM) Dr. Olga Millers (cardiology)  Procedure/Significant Events: 7/2 > missed dialysis, hyperkalemia  Culture NA  Antibiotics: NA  DVT prophylaxis: SCD, heparin during dialysis   Objective: VITAL SIGNS: Temp: 97.7 F (36.5 C) (07/05 0944) Temp src: Oral (07/05 0944) BP: 136/66 mmHg (07/05 0944) Pulse Rate: 75 (07/05 0944) SPO2; FIO2:   Intake/Output Summary (Last 24 hours) at 05/14/14 1600 Last data filed at 05/13/14 1831  Gross per 24 hour  Intake     50 ml  Output      0 ml  Net     50 ml     Exam: General: A./O. x4, NAD, No acute respiratory distress Lungs: Clear to auscultation bilaterally without wheezes or crackles Cardiovascular: Regular rate and rhythm without murmur gallop or rub normal S1 and S2 Abdomen: Nontender, nondistended, soft, bowel sounds positive, no rebound, no ascites, no appreciable mass Extremities: No significant cyanosis, clubbing, or edema bilateral lower extremities  Data Reviewed: Basic Metabolic Panel:  Recent Labs Lab 05/11/14 2348 05/11/14 2354 05/12/14 0022 05/12/14 0244 05/12/14 0510 05/12/14 1042 05/13/14 0258  NA 132* 129*  --  133*  --  139 139  K 7.0* 6.9*  --  6.4* 5.9* 3.6* 4.8  CL 90* 100  --  91*  --  97 95*  CO2 19  --   --  18*  --  28 21  GLUCOSE 225* 230*  --  138*  --  53* 59*  BUN 119* 136*  --  122*  --  35* 52*  CREATININE 11.72* 12.80*  --  11.89*  --  4.95* 6.99*  CALCIUM 7.7*  --   --  9.0  --  8.6 8.4  MG  --   --  2.8*  --   --   --   --   PHOS  --   --  12.2*  --   --   --   --    Liver Function Tests:  Recent Labs Lab 05/11/14 2348  AST 200*  ALT 101*  ALKPHOS 194*  BILITOT 0.5  PROT 6.6  ALBUMIN 2.9*   CBC:  Recent Labs Lab 05/11/14 2348 05/11/14 2354 05/13/14 0258  WBC 6.5  --  6.3  NEUTROABS 5.6  --  5.3  HGB 9.9* 10.5* 12.4*  HCT 30.2* 31.0* 38.2*  MCV 98.4  --  99.7  PLT 137*  --  235   CBG:  Recent Labs Lab 05/13/14 0805 05/13/14 1952 05/14/14 0004  05/14/14 0406 05/14/14 0752  GLUCAP 95 276* 134* 95 134*    Recent Results (from the past 240 hour(s))  MRSA PCR SCREENING     Status: None   Collection Time    05/12/14  3:54 AM      Result Value Ref Range Status   MRSA by PCR NEGATIVE  NEGATIVE Final   Comment:            The GeneXpert MRSA Assay (FDA     approved for NASAL specimens     only), is one component of a     comprehensive MRSA colonization     surveillance program. It is not     intended to diagnose MRSA     infection nor to guide or     monitor treatment for     MRSA infections.     Studies:  Recent x-ray studies have been reviewed in detail by the Attending Physician  Scheduled Meds:  Scheduled Meds: . amLODipine  10 mg Oral Daily  . aspirin  81 mg Oral Daily  . atorvastatin  80 mg Oral QHS  . calcium acetate  2,001 mg Oral TID WC  . calcium acetate  667 mg Oral QHS  . clopidogrel  75 mg Oral Daily  . ferrous sulfate  325 mg Oral TID WC  . hydrALAZINE  50 mg Oral 3 times per day  . insulin aspart  0-15 Units Subcutaneous 6 times per day  . isosorbide mononitrate  60 mg Oral Daily  . mirtazapine  15 mg Oral QHS  . pantoprazole  40 mg Oral Q1200  . sertraline  100 mg Oral Daily    Time spent on care of this patient: 40 mins   Vassie LollMadera, Wilfred Dayrit , MD  [phone]  If 7PM-7AM, please contact night-coverage [email or web address] Password [email or web address] 05/14/2014, 4:00 PM   LOS: 3 days

## 2014-05-14 NOTE — Consult Note (Signed)
[facility] Face-to-Face Psychiatry Consult   Reason for Consult:  Competency evaluation Referring Physician:  Dr. Jethro Poling Albert Grant is an 73 y.o. male. Total Time spent with patient: 45 minutes  Assessment: AXIS I:  Major Depression, Recurrent severe AXIS II:  Deferred AXIS III:   Past Medical History  Diagnosis Date  . 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 Hypertension   . CAD (coronary artery disease)      with angioplasty  . 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 Cardiomyopathy     with ejection fraction of 45 %  . Heart failure, systolic and diastolic   . Anemia   . CHF (congestive heart failure)    AXIS IV:  other psychosocial or environmental problems, problems related to social environment and problems with primary support group AXIS V:  41-50 serious symptoms  Plan:  Patient has a significant barriers with language and no family members in hospital Will ask social service to contact with family members and ask interpreter for fair psych evaluation for competency evaluation Recommend psychiatric Inpatient admission when medically cleared. Supportive therapy provided about ongoing stressors. Change setraline to Wellbutrin XL 150 mg Qam Appreciate psychiatric consultation and follow up as clinically required Please contact [phone] if needs further assistance  Subjective:   Albert Grant is a 73 y.o. male patient admitted with Competency evaluation for refusing dialysis  HPI: patient is seen, chart reviewed and case discussed with Dr. Dyann Kief. Patient appeared lying in his bed, calm and fairly cooperative. Psychiatric consultation requested for capacity evaluation as he is a resident of Nursing home and refusing his dialysis. Patient has a significant barriers with language ([place]) and no family members in hospital Will ask social service to contact with family members and ask interpreter for fair psych evaluation for competency evaluation. Patient  has failed to respond appropriately simple question like orientation and he stated he wants to go to hospital while he was in hospital. He does reports feeling depressed and wants to die. He has been taking medication zoloft but seems not helping at this time, so will change to Wellbutrin for better response.  Medical history: 73 year old male with PMH as below, which includes ESRD on HD, CAD, and DM. He refused dialysis 7/2, which [place], stating that he just wants to die. After missing dialysis he had an aleration in his mental status and was brought to [facility] ED. In ED found to be hyperkalemic at 6.9. Was given Ca++, insulin, and bicarb. Also noted to be in junctional rhythm and has been placed on external pacer. Renal following, planning to diurese tonight. PCCM asked to see for admission  HPI Elements:   Location:  depression. Quality:  poor. Severity:  chronic and acute. Timing:  multi factorial.  Past Psychiatric History: Past Medical History  Diagnosis Date  . 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 Hypertension   . CAD (coronary artery disease)      with angioplasty  . 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 Cardiomyopathy     with ejection fraction of 45 %  . Heart failure, systolic and diastolic   . Anemia   . CHF (congestive heart failure)     reports that he has never smoked. He has never used smokeless tobacco. He reports that he does not drink alcohol  or use illicit drugs. History reviewed. No pertinent family history.         Allergies:   Allergies  Allergen Reactions  . Lisinopril Other (See Comments)    Per MAR    ACT Assessment Complete:  No Objective: Blood pressure 136/66, pulse 75, temperature 97.7 F (36.5 C), temperature source Oral, resp. rate 15, height 5' 5"  (1.651 m), weight 64.8 kg (142 lb 13.7 oz), SpO2 97.00%.Body mass index is 23.77 kg/(m^2). Results for orders placed during the  hospital encounter of 05/11/14 (from the past 72 hour(s))  CBG MONITORING, ED     Status: Abnormal   Collection Time    05/11/14 11:47 PM      Result Value Ref Range   Glucose-Capillary 209 (*) 70 - 99 mg/dL  CBC WITH DIFFERENTIAL     Status: Abnormal   Collection Time    05/11/14 11:48 PM      Result Value Ref Range   WBC 6.5  4.0 - 10.5 K/uL   RBC 3.07 (*) 4.22 - 5.81 MIL/uL   Hemoglobin 9.9 (*) 13.0 - 17.0 g/dL   HCT 30.2 (*) 39.0 - 52.0 %   MCV 98.4  78.0 - 100.0 fL   MCH 32.2  26.0 - 34.0 pg   MCHC 32.8  30.0 - 36.0 g/dL   RDW 13.6  11.5 - 15.5 %   Platelets 137 (*) 150 - 400 K/uL   Neutrophils Relative % 85 (*) 43 - 77 %   Neutro Abs 5.6  1.7 - 7.7 K/uL   Lymphocytes Relative 9 (*) 12 - 46 %   Lymphs Abs 0.6 (*) 0.7 - 4.0 K/uL   Monocytes Relative 5  3 - 12 %   Monocytes Absolute 0.3  0.1 - 1.0 K/uL   Eosinophils Relative 1  0 - 5 %   Eosinophils Absolute 0.0  0.0 - 0.7 K/uL   Basophils Relative 0  0 - 1 %   Basophils Absolute 0.0  0.0 - 0.1 K/uL  COMPREHENSIVE METABOLIC PANEL     Status: Abnormal   Collection Time    05/11/14 11:48 PM      Result Value Ref Range   Sodium 132 (*) 137 - 147 mEq/L   Potassium 7.0 (*) 3.7 - 5.3 mEq/L   Comment: CRITICAL RESULT CALLED TO, READ BACK BY AND VERIFIED WITH:     PEACHEY,T RN 05/12/2014 0051 JORDANS     REPEATED TO VERIFY   Chloride 90 (*) 96 - 112 mEq/L   CO2 19  19 - 32 mEq/L   Glucose, Bld 225 (*) 70 - 99 mg/dL   BUN 119 (*) 6 - 23 mg/dL   Creatinine, Ser 11.72 (*) 0.50 - 1.35 mg/dL   Calcium 7.7 (*) 8.4 - 10.5 mg/dL   Total Protein 6.6  6.0 - 8.3 g/dL   Albumin 2.9 (*) 3.5 - 5.2 g/dL   AST 200 (*) 0 - 37 U/L   ALT 101 (*) 0 - 53 U/L   Alkaline Phosphatase 194 (*) 39 - 117 U/L   Total Bilirubin 0.5  0.3 - 1.2 mg/dL   GFR calc non Af Amer 4 (*) >90 mL/min   GFR calc Af Amer 4 (*) >90 mL/min   Comment: (NOTE)     The eGFR has been calculated using the CKD EPI equation.     This calculation has not been validated in  all clinical situations.     eGFR's persistently <90 mL/min signify possible Chronic Kidney  Disease.   Anion gap 23 (*) 5 - 15   Comment: RESULT CHECKED  I-STAT TROPOININ, ED     Status: None   Collection Time    05/11/14 11:54 PM      Result Value Ref Range   Troponin i, poc 0.03  0.00 - 0.08 ng/mL   Comment 3            Comment: Due to the release kinetics of cTnI,     a negative result within the first hours     of the onset of symptoms does not rule out     myocardial infarction with certainty.     If myocardial infarction is still suspected,     repeat the test at appropriate intervals.  I-STAT CHEM 8, ED     Status: Abnormal   Collection Time    05/11/14 11:54 PM      Result Value Ref Range   Sodium 129 (*) 137 - 147 mEq/L   Potassium 6.9 (*) 3.7 - 5.3 mEq/L   Chloride 100  96 - 112 mEq/L   BUN 136 (*) 6 - 23 mg/dL   Creatinine, Ser 12.80 (*) 0.50 - 1.35 mg/dL   Glucose, Bld 230 (*) 70 - 99 mg/dL   Calcium, Ion 0.91 (*) 1.13 - 1.30 mmol/L   TCO2 20  0 - 100 mmol/L   Hemoglobin 10.5 (*) 13.0 - 17.0 g/dL   HCT 31.0 (*) 39.0 - 52.0 %  I-STAT CG4 LACTIC ACID, ED     Status: None   Collection Time    05/11/14 11:57 PM      Result Value Ref Range   Lactic Acid, Venous 1.11  0.5 - 2.2 mmol/L  MAGNESIUM     Status: Abnormal   Collection Time    05/12/14 12:22 AM      Result Value Ref Range   Magnesium 2.8 (*) 1.5 - 2.5 mg/dL  PHOSPHORUS     Status: Abnormal   Collection Time    05/12/14 12:22 AM      Result Value Ref Range   Phosphorus 12.2 (*) 2.3 - 4.6 mg/dL  I-STAT VENOUS BLOOD GAS, ED     Status: Abnormal   Collection Time    05/12/14 12:28 AM      Result Value Ref Range   pH, Ven 7.299  7.250 - 7.300   pCO2, Ven 44.7 (*) 45.0 - 50.0 mmHg   pO2, Ven 35.0  30.0 - 45.0 mmHg   Bicarbonate 21.9  20.0 - 24.0 mEq/L   TCO2 23  0 - 100 mmol/L   O2 Saturation 61.0     Acid-base deficit 4.0 (*) 0.0 - 2.0 mmol/L   Sample type VENOUS     Comment NOTIFIED PHYSICIAN     BASIC METABOLIC PANEL     Status: Abnormal   Collection Time    05/12/14  2:44 AM      Result Value Ref Range   Sodium 133 (*) 137 - 147 mEq/L   Potassium 6.4 (*) 3.7 - 5.3 mEq/L   Chloride 91 (*) 96 - 112 mEq/L   Comment: DELTA CHECK NOTED   CO2 18 (*) 19 - 32 mEq/L   Glucose, Bld 138 (*) 70 - 99 mg/dL   BUN 122 (*) 6 - 23 mg/dL   Creatinine, Ser 11.89 (*) 0.50 - 1.35 mg/dL   Calcium 9.0  8.4 - 10.5 mg/dL   GFR calc non Af Amer 4 (*) >90 mL/min  GFR calc Af Amer 4 (*) >90 mL/min   Comment: (NOTE)     The eGFR has been calculated using the CKD EPI equation.     This calculation has not been validated in all clinical situations.     eGFR's persistently <90 mL/min signify possible Chronic Kidney     Disease.   Anion gap 24 (*) 5 - 15   Comment: RESULT CHECKED  MRSA PCR SCREENING     Status: None   Collection Time    05/12/14  3:54 AM      Result Value Ref Range   MRSA by PCR NEGATIVE  NEGATIVE   Comment:            The GeneXpert MRSA Assay (FDA     approved for NASAL specimens     only), is one component of a     comprehensive MRSA colonization     surveillance program. It is not     intended to diagnose MRSA     infection nor to guide or     monitor treatment for     MRSA infections.  HEPATITIS B SURFACE ANTIGEN     Status: None   Collection Time    05/12/14  4:42 AM      Result Value Ref Range   Hepatitis B Surface Ag NEGATIVE  NEGATIVE   Comment: Performed at [facility]  POTASSIUM     Status: Abnormal   Collection Time    05/12/14  5:10 AM      Result Value Ref Range   Potassium 5.9 (*) 3.7 - 5.3 mEq/L  GLUCOSE, CAPILLARY     Status: None   Collection Time    05/12/14  8:40 AM      Result Value Ref Range   Glucose-Capillary 90  70 - 99 mg/dL  BASIC METABOLIC PANEL     Status: Abnormal   Collection Time    05/12/14 10:42 AM      Result Value Ref Range   Sodium 139  137 - 147 mEq/L   Potassium 3.6 (*) 3.7 - 5.3 mEq/L   Chloride 97  96 - 112  mEq/L   CO2 28  19 - 32 mEq/L   Glucose, Bld 53 (*) 70 - 99 mg/dL   BUN 35 (*) 6 - 23 mg/dL   Creatinine, Ser 4.95 (*) 0.50 - 1.35 mg/dL   Comment: DELTA CHECK NOTED   Calcium 8.6  8.4 - 10.5 mg/dL   GFR calc non Af Amer 10 (*) >90 mL/min   GFR calc Af Amer 12 (*) >90 mL/min   Comment: (NOTE)     The eGFR has been calculated using the CKD EPI equation.     This calculation has not been validated in all clinical situations.     eGFR's persistently <90 mL/min signify possible Chronic Kidney     Disease.   Anion gap 14  5 - 15  GLUCOSE, CAPILLARY     Status: Abnormal   Collection Time    05/12/14  9:56 PM      Result Value Ref Range   Glucose-Capillary 43 (*) 70 - 99 mg/dL  GLUCOSE, CAPILLARY     Status: None   Collection Time    05/12/14 10:31 PM      Result Value Ref Range   Glucose-Capillary 84  70 - 99 mg/dL  GLUCOSE, CAPILLARY     Status: None   Collection Time    05/13/14  1:06 AM  Result Value Ref Range   Glucose-Capillary 70  70 - 99 mg/dL  BASIC METABOLIC PANEL     Status: Abnormal   Collection Time    05/13/14  2:58 AM      Result Value Ref Range   Sodium 139  137 - 147 mEq/L   Potassium 4.8  3.7 - 5.3 mEq/L   Comment: DELTA CHECK NOTED     NO VISIBLE HEMOLYSIS   Chloride 95 (*) 96 - 112 mEq/L   CO2 21  19 - 32 mEq/L   Glucose, Bld 59 (*) 70 - 99 mg/dL   BUN 52 (*) 6 - 23 mg/dL   Creatinine, Ser 6.99 (*) 0.50 - 1.35 mg/dL   Calcium 8.4  8.4 - 10.5 mg/dL   GFR calc non Af Amer 7 (*) >90 mL/min   GFR calc Af Amer 8 (*) >90 mL/min   Comment: (NOTE)     The eGFR has been calculated using the CKD EPI equation.     This calculation has not been validated in all clinical situations.     eGFR's persistently <90 mL/min signify possible Chronic Kidney     Disease.   Anion gap 23 (*) 5 - 15  CBC WITH DIFFERENTIAL     Status: Abnormal   Collection Time    05/13/14  2:58 AM      Result Value Ref Range   WBC 6.3  4.0 - 10.5 K/uL   RBC 3.83 (*) 4.22 - 5.81 MIL/uL    Hemoglobin 12.4 (*) 13.0 - 17.0 g/dL   HCT 38.2 (*) 39.0 - 52.0 %   MCV 99.7  78.0 - 100.0 fL   MCH 32.4  26.0 - 34.0 pg   MCHC 32.5  30.0 - 36.0 g/dL   RDW 13.8  11.5 - 15.5 %   Platelets 235  150 - 400 K/uL   Comment: DELTA CHECK NOTED     REPEATED TO VERIFY   Neutrophils Relative % 84 (*) 43 - 77 %   Neutro Abs 5.3  1.7 - 7.7 K/uL   Lymphocytes Relative 8 (*) 12 - 46 %   Lymphs Abs 0.5 (*) 0.7 - 4.0 K/uL   Monocytes Relative 7  3 - 12 %   Monocytes Absolute 0.5  0.1 - 1.0 K/uL   Eosinophils Relative 0  0 - 5 %   Eosinophils Absolute 0.0  0.0 - 0.7 K/uL   Basophils Relative 0  0 - 1 %   Basophils Absolute 0.0  0.0 - 0.1 K/uL  GLUCOSE, CAPILLARY     Status: Abnormal   Collection Time    05/13/14  6:16 AM      Result Value Ref Range   Glucose-Capillary 60 (*) 70 - 99 mg/dL  GLUCOSE, CAPILLARY     Status: Abnormal   Collection Time    05/13/14  6:36 AM      Result Value Ref Range   Glucose-Capillary 109 (*) 70 - 99 mg/dL  GLUCOSE, CAPILLARY     Status: None   Collection Time    05/13/14  8:05 AM      Result Value Ref Range   Glucose-Capillary 95  70 - 99 mg/dL  GLUCOSE, CAPILLARY     Status: Abnormal   Collection Time    05/13/14  7:52 PM      Result Value Ref Range   Glucose-Capillary 276 (*) 70 - 99 mg/dL  GLUCOSE, CAPILLARY     Status: Abnormal   Collection Time  05/14/14 12:04 AM      Result Value Ref Range   Glucose-Capillary 134 (*) 70 - 99 mg/dL  GLUCOSE, CAPILLARY     Status: None   Collection Time    05/14/14  4:06 AM      Result Value Ref Range   Glucose-Capillary 95  70 - 99 mg/dL  GLUCOSE, CAPILLARY     Status: Abnormal   Collection Time    05/14/14  7:52 AM      Result Value Ref Range   Glucose-Capillary 134 (*) 70 - 99 mg/dL   Labs are reviewed and are pertinent for electrolyte abnormalities including hyperkalemia.  Current Facility-Administered Medications  Medication Dose Route Frequency Provider Last Rate Last Dose  . 0.9 %  sodium  chloride infusion  250 mL Intravenous PRN Corey Harold, NP      . amLODipine (NORVASC) tablet 10 mg  10 mg Oral Daily Elsie Stain, MD   10 mg at 05/13/14 6599  . aspirin chewable tablet 81 mg  81 mg Oral Daily Corey Harold, NP   81 mg at 05/13/14 3570  . atorvastatin (LIPITOR) tablet 80 mg  80 mg Oral QHS Corey Harold, NP      . calcium acetate (PHOSLO) capsule 2,001 mg  2,001 mg Oral TID WC Corey Harold, NP   2,001 mg at 05/14/14 0818  . calcium acetate (PHOSLO) capsule 667 mg  667 mg Oral QHS Rise Patience, MD      . clopidogrel (PLAVIX) tablet 75 mg  75 mg Oral Daily Corey Harold, NP   75 mg at 05/13/14 1779  . ferrous sulfate tablet 325 mg  325 mg Oral TID WC Corey Harold, NP   325 mg at 05/14/14 3903  . heparin injection 5,900 Units  100 Units/kg Dialysis PRN Rexene Agent, MD      . hydrALAZINE (APRESOLINE) tablet 50 mg  50 mg Oral 3 times per day Elsie Stain, MD   50 mg at 05/13/14 2152  . insulin aspart (novoLOG) injection 0-15 Units  0-15 Units Subcutaneous 6 times per day Allie Bossier, MD   2 Units at 05/14/14 0820  . isosorbide mononitrate (IMDUR) 24 hr tablet 60 mg  60 mg Oral Daily Corey Harold, NP   60 mg at 05/13/14 0092  . mirtazapine (REMERON) tablet 15 mg  15 mg Oral QHS Corey Harold, NP      . morphine 2 MG/ML injection 2-4 mg  2-4 mg Intravenous Q2H PRN Elsie Stain, MD   2 mg at 05/12/14 0834  . oxyCODONE-acetaminophen (PERCOCET/ROXICET) 5-325 MG per tablet 1-2 tablet  1-2 tablet Oral Q6H PRN Corey Harold, NP      . pantoprazole (PROTONIX) EC tablet 40 mg  40 mg Oral Q1200 Elsie Stain, MD      . sertraline (ZOLOFT) tablet 100 mg  100 mg Oral Daily Corey Harold, NP   100 mg at 05/14/14 1021    Psychiatric Specialty Exam: Physical Exam  ROS  Blood pressure 136/66, pulse 75, temperature 97.7 F (36.5 C), temperature source Oral, resp. rate 15, height 5' 5"  (1.651 m), weight 64.8 kg (142 lb 13.7 oz), SpO2 97.00%.Body mass index  is 23.77 kg/(m^2).  General Appearance: Guarded  Eye Contact::  Fair  Speech:  Blocked, Garbled, Slow and Slurred  Volume:  Decreased  Mood:  Depressed, Hopeless and Worthless  Affect:  Depressed and Flat  Thought Process:  Disorganized  Orientation:  Full (Time, Place, and Person)  Thought Content:  Rumination  Suicidal Thoughts:  Yes.  without intent/plan  Homicidal Thoughts:  No  Memory:  Immediate;   Poor  Judgement:  Impaired  Insight:  Lacking  Psychomotor Activity:  Psychomotor Retardation  Concentration:  Fair  Recall:  [place] of Knowledge:Poor  Language: Fair  Akathisia:  NA  Handed:  Right  AIMS (if indicated):     Assets:  Financial Resources/Insurance Housing Intimacy Leisure Time Resilience Social Support  Sleep:      Musculoskeletal: Strength & Muscle Tone: within normal limits Gait & Station: unable to stand Patient leans: N/A   Treatment Plan Summary: Daily contact with patient to assess and evaluate symptoms and progress in treatment Medication management  Ward Boissonneault,JANARDHAHA R. 05/14/2014 1:01 PM

## 2014-05-14 NOTE — Progress Notes (Signed)
Patient ID: Albert Grant, male   DOB: 05/07/1941, 73 y.o.   MRN: [id number]  [facility] Progress Note    Subjective:   No complaints   Objective:   BP 136/66  Pulse 75  Temp(Src) 97.7 F (36.5 C) (Oral)  Resp 15  Ht 5\' 5"  (1.651 m)  Wt 64.8 kg (142 lb 13.7 oz)  BMI 23.77 kg/m2  SpO2 97%  Intake/Output: I/O last 3 completed shifts: In: 490 [P.O.:490] Out: 850 [Other:850]   Intake/Output this shift:    Weight change: -0.1 kg (-3.5 oz)  Physical Exam: Gen:WD WN AM in NAD CVS:no rub Resp:cta VAP:OLIDCV Ext:no edema, LAVF +T/B  Labs: BMET  Recent Labs Lab 05/11/14 2348 05/11/14 2354 05/12/14 0022 05/12/14 0244 05/12/14 0510 05/12/14 1042 05/13/14 0258  NA 132* 129*  --  133*  --  139 139  K 7.0* 6.9*  --  6.4* 5.9* 3.6* 4.8  CL 90* 100  --  91*  --  97 95*  CO2 19  --   --  18*  --  28 21  GLUCOSE 225* 230*  --  138*  --  53* 59*  BUN 119* 136*  --  122*  --  35* 52*  CREATININE 11.72* 12.80*  --  11.89*  --  4.95* 6.99*  ALBUMIN 2.9*  --   --   --   --   --   --   CALCIUM 7.7*  --   --  9.0  --  8.6 8.4  PHOS  --   --  12.2*  --   --   --   --    CBC  Recent Labs Lab 05/11/14 2348 05/11/14 2354 05/13/14 0258  WBC 6.5  --  6.3  NEUTROABS 5.6  --  5.3  HGB 9.9* 10.5* 12.4*  HCT 30.2* 31.0* 38.2*  MCV 98.4  --  99.7  PLT 137*  --  235    @IMGRELPRIORS @ Medications:    . amLODipine  10 mg Oral Daily  . aspirin  81 mg Oral Daily  . atorvastatin  80 mg Oral QHS  . calcium acetate  2,001 mg Oral TID WC  . calcium acetate  667 mg Oral QHS  . clopidogrel  75 mg Oral Daily  . ferrous sulfate  325 mg Oral TID WC  . hydrALAZINE  50 mg Oral 3 times per day  . insulin aspart  0-15 Units Subcutaneous 6 times per day  . isosorbide mononitrate  60 mg Oral Daily  . mirtazapine  15 mg Oral QHS  . pantoprazole  40 mg Oral Q1200  . sertraline  100 mg Oral Daily    HD prescription at [facility] [phone]): TTS, 3:15 time,  bfr400/dfr800, 2K/2.5Ca, EDW 60.5kg, No heparin, venofer 50mg  qweek, epo 4500 units IVP tiw    Impression/Plan  1. ESRD - TTS [facility]. His noncompliance has been a recurring issue. He signs off after 2 hours of dialysis on Tuesday and did not go on Thursday. This is the 3rd admission with lifethreatening hyperkalemia in 2 months for refusal to go to dialysis.  Agree with Psych eval.  He signed off early yesterday as well.  2. Life threatening hyperkalemia - 3rd admission in 2 months for refusing diaysis, then being transported here for emergent dialysis. His primary neph's in [place] need to be involved in the discussion of best plan of action for this man when he refuses treatment. 3. Anemia -  no epo due to Hgb >12 4. DM - primary service 5. Encephalopathy - Numbers much worse than would expect for a single missed treatment if otherwise well dialyzed... 6. Disposition- Doubt his competency and therefore he should not be allowed to refuse and the nursing home and/or family should be held responsible for transporting him to dialysis three days a week to prevent these life threatening episodes. If he refuses dialysis while he is an inpt would recommend hospice despite last palliative care meeting or transfer him to [facility] so his primary nephrology team can discuss this further with family and pt. 7.   Albert Grant A 05/14/2014, 10:20 AM

## 2014-05-14 NOTE — Progress Notes (Signed)
Patient refused all night PO medications.

## 2014-05-15 DIAGNOSIS — N058 Unspecified nephritic syndrome with other morphologic changes: Secondary | ICD-10-CM

## 2014-05-15 DIAGNOSIS — I5022 Chronic systolic (congestive) heart failure: Secondary | ICD-10-CM

## 2014-05-15 LAB — GLUCOSE, CAPILLARY
GLUCOSE-CAPILLARY: 128 mg/dL — AB (ref 70–99)
GLUCOSE-CAPILLARY: 94 mg/dL (ref 70–99)
GLUCOSE-CAPILLARY: 241 mg/dL — AB (ref 70–99)
GLUCOSE-CAPILLARY: 300 mg/dL — AB (ref 70–99)
GLUCOSE-CAPILLARY: 158 mg/dL — AB (ref 70–99)
Glucose-Capillary: 166 mg/dL — ABNORMAL HIGH (ref 70–99)

## 2014-05-15 NOTE — Progress Notes (Signed)
Progress Note  Albert Grant HQI:[id number] DOB: 10/29/1941 DOA: 05/11/2014 PCP: Karlene EinsteinASANAYAKA,[name], MD  Admit HPI / Brief Narrative: 73 yo AM PMHc ESRD on HD, CAD, DM [facility] after missing dialysis. In ED found to be hyperkalemic at 6.9. Was given Ca++, insulin, and bicarb. Also noted to be in junctional rhythm and has been placed on external pacer. Renal following planning to diurese tonight. PCCM asked to see for admission. Of note has refused dialysis several times recently saying he just wants to die.    HPI/Subjective: Afebrile, currently without any acute complaints.   Assessment/Plan: ESRD T/TH/Sat  - [facility] [place] [facility] [place]. His noncompliance has been a recurring issue. He signs off after 2 hours of dialysis on Tuesday and did not go on Thursday; ended admitted to the hospital with hyperkalemia and fluid overload. -will follow renal service rec's -electrolytes now corrected -patient w/o complaints -plan is for HD tomorrow again 7/7  Life threatening hyperkalemia  - 3rd admission in 2 months for refusing diaysis, then being transported here for emergent dialysis. His primary neph's in [place] need to be involved in the discussion of best plan of action for this man when he refuses treatment. -Psychiatry recommended changing zoloft to welbutrin  -also recommending psych facility after medically stable.  Anemia  -epo/iron as per renal descretion  DM  -Patient's CBG on the low side, with some hypoglycemic episodes -continue moderate SSI  Encephalopathy -pretty much clear after HD -most likely related to uremia   Noncompliance with medical care -See above discussion     Code Status: FULL Family Communication: no family present at time of exam Disposition Plan: to be determine  Consultants: Dr. Terrial RhodesJoseph Coladonato (nephrology) Dr. Shan LevansPatrick Wright (PCCM) Dr. Olga MillersBrian Crenshaw (cardiology)  Procedure/Significant Events: 7/2 > missed dialysis,  hyperkalemia  Culture NA  Antibiotics: NA  DVT prophylaxis: SCD, heparin during dialysis   Objective: VITAL SIGNS: Temp: 97.7 F (36.5 C) (07/06 0858) Temp src: Oral (07/06 0858) BP: 164/64 mmHg (07/06 0858) Pulse Rate: 78 (07/06 0858) SPO2; FIO2:   Intake/Output Summary (Last 24 hours) at 05/15/14 1943 Last data filed at 05/15/14 1748  Gross per 24 hour  Intake    840 ml  Output      0 ml  Net    840 ml     Exam: General: A./O. x4, NAD, No acute respiratory distress Lungs: Clear to auscultation bilaterally without wheezes or crackles Cardiovascular: Regular rate and rhythm without murmur gallop or rub normal S1 and S2 Abdomen: Nontender, nondistended, soft, bowel sounds positive, no rebound, no ascites, no appreciable mass Extremities: No significant cyanosis, clubbing, or edema bilateral lower extremities  Data Reviewed: Basic Metabolic Panel:  Recent Labs Lab 05/11/14 2348 05/11/14 2354 05/12/14 0022 05/12/14 0244 05/12/14 0510 05/12/14 1042 05/13/14 0258  NA 132* 129*  --  133*  --  139 139  K 7.0* 6.9*  --  6.4* 5.9* 3.6* 4.8  CL 90* 100  --  91*  --  97 95*  CO2 19  --   --  18*  --  28 21  GLUCOSE 225* 230*  --  138*  --  53* 59*  BUN 119* 136*  --  122*  --  35* 52*  CREATININE 11.72* 12.80*  --  11.89*  --  4.95* 6.99*  CALCIUM 7.7*  --   --  9.0  --  8.6 8.4  MG  --   --  2.8*  --   --   --   --  PHOS  --   --  12.2*  --   --   --   --    Liver Function Tests:  Recent Labs Lab 05/11/14 2348  AST 200*  ALT 101*  ALKPHOS 194*  BILITOT 0.5  PROT 6.6  ALBUMIN 2.9*   CBC:  Recent Labs Lab 05/11/14 2348 05/11/14 2354 05/13/14 0258  WBC 6.5  --  6.3  NEUTROABS 5.6  --  5.3  HGB 9.9* 10.5* 12.4*  HCT 30.2* 31.0* 38.2*  MCV 98.4  --  99.7  PLT 137*  --  235   CBG:  Recent Labs Lab 05/15/14 0002 05/15/14 0334 05/15/14 0725 05/15/14 1134 05/15/14 1600  GLUCAP 166* 128* 94 241* 300*    Recent Results (from the past 240  hour(s))  MRSA PCR SCREENING     Status: None   Collection Time    05/12/14  3:54 AM      Result Value Ref Range Status   MRSA by PCR NEGATIVE  NEGATIVE Final   Comment:            The GeneXpert MRSA Assay (FDA     approved for NASAL specimens     only), is one component of a     comprehensive MRSA colonization     surveillance program. It is not     intended to diagnose MRSA     infection nor to guide or     monitor treatment for     MRSA infections.     Studies:  Recent x-ray studies have been reviewed in detail by the Attending Physician  Scheduled Meds:  Scheduled Meds: . amLODipine  10 mg Oral Daily  . aspirin  81 mg Oral Daily  . atorvastatin  80 mg Oral QHS  . buPROPion  150 mg Oral Daily  . calcium acetate  2,001 mg Oral TID WC  . calcium acetate  667 mg Oral QHS  . clopidogrel  75 mg Oral Daily  . ferrous sulfate  325 mg Oral TID WC  . hydrALAZINE  50 mg Oral 3 times per day  . insulin aspart  0-15 Units Subcutaneous 6 times per day  . isosorbide mononitrate  60 mg Oral Daily  . mirtazapine  15 mg Oral QHS  . pantoprazole  40 mg Oral Q1200    Time spent on care of this patient: 40 mins   Vassie Loll , MD  [phone]  If 7PM-7AM, please contact night-coverage [email or web address] Password [email or web address] 05/15/2014, 7:43 PM   LOS: 4 days

## 2014-05-15 NOTE — Progress Notes (Signed)
Patient ID: Albert Grant, male   DOB: 22-Apr-1941, 73 y.o.   MRN: [id number]  [facility] Progress Note   Assessment/ Plan:   1. ESRD - [facility] [facility] [place] with recurrent non-compliance. This is the 3rd admission with lifethreatening hyperkalemia in 2 months for refusal to go to dialysis. Seen by psychiatry who need family to help translate to decide on competency. Psych also recommend In-patient behavioral health management. Plan HD tomorrow. 2. Life threatening hyperkalemia - rectified with HD  3. Anemia - no epo due to Hgb >12.  4. DM - primary service  5. Encephalopathy - Improving with HD.  6. Disposition- awaiting family to talk to psychiatry---this will help with placement/disposition.  Subjective:   No acute events overnight   Objective:   BP 164/64  Pulse 78  Temp(Src) 97.7 F (36.5 C) (Oral)  Resp 16  Ht 5\' 5"  (1.651 m)  Wt 64.8 kg (142 lb 13.7 oz)  BMI 23.77 kg/m2  SpO2 96%  Physical Exam: HUT:[id number] resting in bed KCL:EXNTZ RRR, normal S1 and S2 Resp:CTA bilaterally, no rales/rhonchi GYF:VCBS, flat, NT, BS normal Ext:No edema  Labs: BMET  Recent Labs Lab 05/11/14 2348 05/11/14 2354 05/12/14 0022 05/12/14 0244 05/12/14 0510 05/12/14 1042 05/13/14 0258  NA 132* 129*  --  133*  --  139 139  K 7.0* 6.9*  --  6.4* 5.9* 3.6* 4.8  CL 90* 100  --  91*  --  97 95*  CO2 19  --   --  18*  --  28 21  GLUCOSE 225* 230*  --  138*  --  53* 59*  BUN 119* 136*  --  122*  --  35* 52*  CREATININE 11.72* 12.80*  --  11.89*  --  4.95* 6.99*  CALCIUM 7.7*  --   --  9.0  --  8.6 8.4  PHOS  --   --  12.2*  --   --   --   --    CBC  Recent Labs Lab 05/11/14 2348 05/11/14 2354 05/13/14 0258  WBC 6.5  --  6.3  NEUTROABS 5.6  --  5.3  HGB 9.9* 10.5* 12.4*  HCT 30.2* 31.0* 38.2*  MCV 98.4  --  99.7  PLT 137*  --  235   Medications:    . amLODipine  10 mg Oral Daily  . aspirin  81 mg Oral Daily  . atorvastatin  80 mg Oral QHS   . buPROPion  150 mg Oral Daily  . calcium acetate  2,001 mg Oral TID WC  . calcium acetate  667 mg Oral QHS  . clopidogrel  75 mg Oral Daily  . ferrous sulfate  325 mg Oral TID WC  . hydrALAZINE  50 mg Oral 3 times per day  . insulin aspart  0-15 Units Subcutaneous 6 times per day  . isosorbide mononitrate  60 mg Oral Daily  . mirtazapine  15 mg Oral QHS  . pantoprazole  40 mg Oral Q1200   Zetta Bills, MD 05/15/2014, 11:42 AM

## 2014-05-16 DIAGNOSIS — R5381 Other malaise: Secondary | ICD-10-CM

## 2014-05-16 DIAGNOSIS — R5383 Other fatigue: Secondary | ICD-10-CM

## 2014-05-16 LAB — CBC
HCT: 34.5 % — ABNORMAL LOW (ref 39.0–52.0)
HEMOGLOBIN: 11.4 g/dL — AB (ref 13.0–17.0)
MCH: 32 pg (ref 26.0–34.0)
MCHC: 33 g/dL (ref 30.0–36.0)
MCV: 96.9 fL (ref 78.0–100.0)
Platelets: 236 10*3/uL (ref 150–400)
RBC: 3.56 MIL/uL — ABNORMAL LOW (ref 4.22–5.81)
RDW: 12.9 % (ref 11.5–15.5)
WBC: 6.8 10*3/uL (ref 4.0–10.5)

## 2014-05-16 LAB — RENAL FUNCTION PANEL
Albumin: 2.9 g/dL — ABNORMAL LOW (ref 3.5–5.2)
Anion gap: 19 — ABNORMAL HIGH (ref 5–15)
BUN: 86 mg/dL — ABNORMAL HIGH (ref 6–23)
CALCIUM: 7.9 mg/dL — AB (ref 8.4–10.5)
CO2: 22 meq/L (ref 19–32)
CREATININE: 9.1 mg/dL — AB (ref 0.50–1.35)
Chloride: 92 mEq/L — ABNORMAL LOW (ref 96–112)
GFR calc non Af Amer: 5 mL/min — ABNORMAL LOW (ref >90)
GFR, EST AFRICAN AMERICAN: 6 mL/min — AB (ref >90)
Glucose, Bld: 149 mg/dL — ABNORMAL HIGH (ref 70–99)
Phosphorus: 8.7 mg/dL — ABNORMAL HIGH (ref 2.3–4.6)
Potassium: 5.7 mEq/L — ABNORMAL HIGH (ref 3.7–5.3)
Sodium: 133 mEq/L — ABNORMAL LOW (ref 137–147)

## 2014-05-16 LAB — GLUCOSE, CAPILLARY
GLUCOSE-CAPILLARY: 133 mg/dL — AB (ref 70–99)
GLUCOSE-CAPILLARY: 167 mg/dL — AB (ref 70–99)
Glucose-Capillary: 113 mg/dL — ABNORMAL HIGH (ref 70–99)
Glucose-Capillary: 144 mg/dL — ABNORMAL HIGH (ref 70–99)
Glucose-Capillary: 156 mg/dL — ABNORMAL HIGH (ref 70–99)
Glucose-Capillary: 360 mg/dL — ABNORMAL HIGH (ref 70–99)

## 2014-05-16 MED ORDER — BOOST / RESOURCE BREEZE PO LIQD
1.0000 | Freq: Two times a day (BID) | ORAL | Status: DC
  Administered 2014-05-17 – 2014-05-19 (×4): 1 via ORAL

## 2014-05-16 NOTE — Progress Notes (Signed)
Patient ID: Albert Grant, male   DOB: 06/09/1941, 73 y.o.   MRN: [id number]  Patient is in hemodialysis and unable to complete the competency evaluation and will try again with translator.  Eryn Marandola,JANARDHAHA R. 05/16/2014 2:34 PM

## 2014-05-16 NOTE — Progress Notes (Signed)
Paged Dr. Gwenlyn Perking, does he want a pt consult.

## 2014-05-16 NOTE — Progress Notes (Signed)
Pt refused all 0800 po medications.  Pt also refused norvasc 10 mg po for elevated bp.  Pt refused multiple times.

## 2014-05-16 NOTE — Clinical Social Work Psychosocial (Signed)
     Clinical Social Work Department BRIEF PSYCHOSOCIAL ASSESSMENT 05/16/2014  Patient:  Albert Grant, Albert Grant     Account Number:  [id number]     Admit date:  05/11/2014  Clinical Social Worker:  Tiburcio Pea  Date/Time:  05/16/2014 09:21 PM  Referred by:  Physician  Date Referred:  05/15/2014 Referred for  Other - See comment   Other Referral:   Return to SNF   Interview type:   Other interview type:    PSYCHOSOCIAL DATA Living Status:  FACILITY Admitted from facility:  [facility] Level of care:  Skilled Nursing Facility Primary support name:  Othman Cocchiola-  [phone] Primary support relationship to patient:  CHILD, ADULT Degree of support available:   Strong support    CURRENT CONCERNS Current Concerns  Other - See comment   Other Concerns:   Inconsistently refusing dialysis  Needs Interpreter  Capacity issues    SOCIAL WORK ASSESSMENT / PLAN 73 year old male resident of [facility]. Per nursing- patient is refusing dialysis treatments much of the time.  CSW spoke to [place]- Admissions at [facility] who stated that he has been refusing many dialysis treatments while at the facility as well.  MD has ordered a Psych evaluation to determine capacity.  Facility will accept back when stable.  Eval attempted today but patient was in dialysis but he woudl not agree to full treatment. Psychiatrist will plan to assess patient tomorrow around 2 pm with help of interpreter.  CSW will plan to meet with patient and son tomorrow as well to discuss concerns/needs. Fl2 will be initiated and placed on chart for MD's signature.   Assessment/plan status:  Psychosocial Support/Ongoing Assessment of Needs Other assessment/ plan:   Information/referral to community resources:   None at this time    PATIENTS/FAMILYS RESPONSE TO PLAN OF CARE: CSW will need to monitor and discuss plan of care with patient/son depending on results of capacity assessment tomorrow.   Follow up will be made with patient and or son accordingly.  Palliative or hospice care should be considered if patient is deems to have capacity and does not want further treatments.  CSW will provide supportive intervention.

## 2014-05-16 NOTE — Procedures (Addendum)
Patient seen on Hemodialysis. QB 400, UF goal 4.5L Treatment adjusted as needed.  Patient informed me that he WOULD NOT any longer than 2 hours.  Zetta Bills MD [facility]. Office # [phone] Pager # [phone] 12:42 PM

## 2014-05-16 NOTE — Clinical Social Work Psych Note (Signed)
Interpreter scheduled for 2pm for psychiatrist to complete capacity evaluation.  Psychiatrist and RN aware.    Vickii Penna, LCSWA [phone]  Clinical Social Work

## 2014-05-16 NOTE — Progress Notes (Signed)
Pt back from dialysis, pt signed off  1 and 1/2 hrs early, removed 2 liters.

## 2014-05-16 NOTE — Progress Notes (Signed)
Progress Note  Nickoli Trupp OZY:[id number] DOB: 03/23/1941 DOA: 05/11/2014 PCP: Karlene Einstein, MD  Admit HPI / Brief Narrative: 73 yo AM PMHc ESRD on HD, CAD, DM presents to [facility] ED after missing dialysis. In ED found to be hyperkalemic at 6.9. Was given Ca++, insulin, and bicarb. Also noted to be in junctional rhythm and has been placed on external pacer. Renal following planning to diurese tonight. PCCM asked to see for admission. Of note has refused dialysis several times recently saying he just wants to die.    HPI/Subjective: Afebrile, currently without any acute complaints. Patient able to answer question and follow simple commands  Assessment/Plan: ESRD T/TH/Sat  - TTS [facility] [place] [facility] [place]. His noncompliance has been a recurring issue. He signs off after 2 hours of dialysis on Tuesday and did not go on Thursday; ended admitted to the hospital with hyperkalemia and fluid overload. -will follow renal service rec's -electrolytes now corrected -patient w/o complaints -plan is for HD today again 7/7  Life threatening hyperkalemia  - 3rd admission in 2 months for refusing diaysis, then being transported here for emergent dialysis. His primary neph's in [place] need to be involved in the discussion of best plan of action for this man when he refuses treatment. -Psychiatry recommended changing zoloft to welbutrin  -competency evaluation with assistance from interpreter pending -also recommending psych facility after medically stable.  Anemia  -epo/iron as per renal discretion -Hgb stable  DM  -Patient's CBG on the low side, with some hypoglycemic episodes -continue moderate SSI  Encephalopathy -pretty much clear after HD -most likely related to uremia   Noncompliance with medical care -See above discussion   Code Status: FULL Family Communication: no family present at time of exam Disposition Plan: to be determine  Consultants: Dr. Terrial Rhodes  (nephrology) Dr. Shan Levans (PCCM) Dr. Olga Millers (cardiology)  Procedure/Significant Events: 7/2 > missed dialysis, hyperkalemia  Culture NA  Antibiotics: NA  DVT prophylaxis: SCD, heparin during dialysis   Objective: VITAL SIGNS: Temp: 97.5 F (36.4 C) (07/07 1458) Temp src: Oral (07/07 1458) BP: 172/81 mmHg (07/07 1458) Pulse Rate: 90 (07/07 1458) SPO2; FIO2:   Intake/Output Summary (Last 24 hours) at 05/16/14 1553 Last data filed at 05/16/14 1458  Gross per 24 hour  Intake    600 ml  Output   2073 ml  Net  -1473 ml     Exam: General: AAOx2, NAD, No acute respiratory distress Lungs: Clear to auscultation bilaterally without wheezes or crackles Cardiovascular: Regular rate and rhythm without murmur gallop or rub normal S1 and S2 Abdomen: Nontender, nondistended, soft, bowel sounds positive, no rebound, no ascites, no appreciable mass Extremities: No significant cyanosis, clubbing, or edema bilateral lower extremities  Data Reviewed: Basic Metabolic Panel:  Recent Labs Lab 05/11/14 2348 05/11/14 2354 05/12/14 0022 05/12/14 0244 05/12/14 0510 05/12/14 1042 05/13/14 0258 05/16/14 1326  NA 132* 129*  --  133*  --  139 139 133*  K 7.0* 6.9*  --  6.4* 5.9* 3.6* 4.8 5.7*  CL 90* 100  --  91*  --  97 95* 92*  CO2 19  --   --  18*  --  28 21 22   GLUCOSE 225* 230*  --  138*  --  53* 59* 149*  BUN 119* 136*  --  122*  --  35* 52* 86*  CREATININE 11.72* 12.80*  --  11.89*  --  4.95* 6.99* 9.10*  CALCIUM 7.7*  --   --  9.0  --  8.6 8.4 7.9*  MG  --   --  2.8*  --   --   --   --   --   PHOS  --   --  12.2*  --   --   --   --  8.7*   Liver Function Tests:  Recent Labs Lab 05/11/14 2348 05/16/14 1326  AST 200*  --   ALT 101*  --   ALKPHOS 194*  --   BILITOT 0.5  --   PROT 6.6  --   ALBUMIN 2.9* 2.9*   CBC:  Recent Labs Lab 05/11/14 2348 05/11/14 2354 05/13/14 0258 05/16/14 1326  WBC 6.5  --  6.3 6.8  NEUTROABS 5.6  --  5.3  --   HGB  9.9* 10.5* 12.4* 11.4*  HCT 30.2* 31.0* 38.2* 34.5*  MCV 98.4  --  99.7 96.9  PLT 137*  --  235 236   CBG:  Recent Labs Lab 05/15/14 2039 05/16/14 0014 05/16/14 0424 05/16/14 0800 05/16/14 1142  GLUCAP 158* 167* 113* 144* 156*    Recent Results (from the past 240 hour(s))  MRSA PCR SCREENING     Status: None   Collection Time    05/12/14  3:54 AM      Result Value Ref Range Status   MRSA by PCR NEGATIVE  NEGATIVE Final   Comment:            The GeneXpert MRSA Assay (FDA     approved for NASAL specimens     only), is one component of a     comprehensive MRSA colonization     surveillance program. It is not     intended to diagnose MRSA     infection nor to guide or     monitor treatment for     MRSA infections.     Scheduled Meds:  Scheduled Meds: . amLODipine  10 mg Oral Daily  . aspirin  81 mg Oral Daily  . atorvastatin  80 mg Oral QHS  . buPROPion  150 mg Oral Daily  . calcium acetate  2,001 mg Oral TID WC  . calcium acetate  667 mg Oral QHS  . clopidogrel  75 mg Oral Daily  . [START ON 05/17/2014] feeding supplement (RESOURCE BREEZE)  1 Container Oral BID BM  . ferrous sulfate  325 mg Oral TID WC  . hydrALAZINE  50 mg Oral 3 times per day  . insulin aspart  0-15 Units Subcutaneous 6 times per day  . isosorbide mononitrate  60 mg Oral Daily  . mirtazapine  15 mg Oral QHS  . pantoprazole  40 mg Oral Q1200    Time spent on care of this patient: < 30 mins   Vassie LollMadera, Quanika Solem , MD  [phone]  If 7PM-7AM, please contact night-coverage [email or web address] Password [email or web address] 05/16/2014, 3:53 PM   LOS: 5 days

## 2014-05-16 NOTE — Progress Notes (Signed)
Pt interpretor and psych here, pt in dialysis.  Psych rescheduled for tomorrow at 1400, pt does not have dialysis tomorrow.

## 2014-05-16 NOTE — Progress Notes (Signed)
Pt scheduled for dialysis today possibly this afternoon.

## 2014-05-16 NOTE — Progress Notes (Addendum)
NUTRITION FOLLOW-UP  DOCUMENTATION CODES Per approved criteria  -Non-severe (moderate) malnutrition in the context of chronic illness   Patient continues to meet criteria for non-severe (moderate) malnutrition in the context of chronic illness as evidenced by 13% weight loss x 4 months and mild muscle loss.  INTERVENTION: Add Resource Breeze po BID, each supplement provides 250 kcal and 9 grams of protein. RD to continue to follow nutrition care plan.  NUTRITION DIAGNOSIS: Increased nutrient needs related to ESRD on HD as evidenced by estimated nutrition needs - ongoing.  Goal: Pt to meet >/= 90% of their estimated nutrition needs   Monitor:  PO & supplemental intake, weight, labs, I/O's  ASSESSMENT: 73 yo [place] Male with ESRD on HD, CAD, DM [facility]. In ED found to be hyperkalemic at 6.9. Was given Ca++, insulin, and bicarb. Also noted to be in junctional rhythm and has been placed on external pacer. Renal following planning to diurese tonight. Of note has refused dialysis several times recently saying he just wants to die.  Per chart review, patient is planning to meet with interpreter and psych MD tomorrow at 1400 in order to complete capacity evaluation.  Patient with adequate meal intake, consuming 75-100% of current Carbohydrate Modified Medium meals.  CBG's: 113 - 156 Potassium WNL Phosphorus elevated at 12.2 --> ordered for [place] Magnesium elevated at 2.8  Height: Ht Readings from Last 1 Encounters:  05/12/14 5\' 5"  (1.651 m)    Weight: Wt Readings from Last 1 Encounters:  05/16/14 148 lb 9.4 oz (67.4 kg)  Admit wt 143 lb  BMI:  Body mass index is 24.73 kg/(m^2). WNL  Estimated Nutritional Needs: Kcal: 1800-2000 Protein: 90-100 gm Fluid: 1200 ml  Skin: Intact  Diet Order: Carb Control   Intake/Output Summary (Last 24 hours) at 05/16/14 1409 Last data filed at 05/16/14 1000  Gross per 24 hour  Intake    600 ml   Output      0 ml  Net    600 ml    Last BM: 7/4  Labs:   Recent Labs Lab 05/12/14 0022 05/12/14 0244 05/12/14 0510 05/12/14 1042 05/13/14 0258  NA  --  133*  --  139 139  K  --  6.4* 5.9* 3.6* 4.8  CL  --  91*  --  97 95*  CO2  --  18*  --  28 21  BUN  --  122*  --  35* 52*  CREATININE  --  11.89*  --  4.95* 6.99*  CALCIUM  --  9.0  --  8.6 8.4  MG 2.8*  --   --   --   --   PHOS 12.2*  --   --   --   --   GLUCOSE  --  138*  --  53* 59*    CBG (last 3)   Recent Labs  05/16/14 0424 05/16/14 0800 05/16/14 1142  GLUCAP 113* 144* 156*    Scheduled Meds: . amLODipine  10 mg Oral Daily  . aspirin  81 mg Oral Daily  . atorvastatin  80 mg Oral QHS  . buPROPion  150 mg Oral Daily  . calcium acetate  2,001 mg Oral TID WC  . calcium acetate  667 mg Oral QHS  . clopidogrel  75 mg Oral Daily  . ferrous sulfate  325 mg Oral TID WC  . hydrALAZINE  50 mg Oral 3 times per day  . insulin aspart  0-15 Units Subcutaneous 6 times per day  . isosorbide mononitrate  60 mg Oral Daily  . mirtazapine  15 mg Oral QHS  . pantoprazole  40 mg Oral Q1200    Continuous Infusions:   Jarold Motto MS, RD, LDN Inpatient Registered Dietitian Pager: [phone] After-hours pager: [phone]

## 2014-05-16 NOTE — Progress Notes (Signed)
Paged Dr. Gwenlyn Perking to let him know interpretor on floor if he needed to speak with him.  Dr. Gwenlyn Perking stated he did speak with son this am and he did not need to speak with him.  He stated psych needed to confirm if he is able to make own decisions.

## 2014-05-16 NOTE — Progress Notes (Signed)
Inpatient Diabetes Program Recommendations  AACE/ADA: New Consensus Statement on Inpatient Glycemic Control (2013)  Target Ranges:  Prepandial:   less than 140 mg/dL      Peak postprandial:   less than 180 mg/dL (1-2 hours)      Critically ill patients:  140 - 180 mg/dL     Results for ASHTEN, RAPKIN (MRN [id number]) as of 05/16/2014 07:42  Ref. Range 05/15/2014 00:02 05/15/2014 03:34 05/15/2014 07:25 05/15/2014 11:34 05/15/2014 16:00 05/15/2014 20:39  Glucose-Capillary Latest Range: 70-99 mg/dL 419 (H) 622 (H) 94 297 (H) 300 (H) 158 (H)     Patient eating 75-100% of meals.  Having elevated postprandial CBGs.   MD- Please consider the following:  1. Change Novolog Moderate SSI to tid ac + HS (currently ordered as Q4 hour coverage) 2. Add Novolog Meal Coverage- Novolog 4 units tid with meals     Will follow Ambrose Finland RN, MSN, CDE Diabetes Coordinator Inpatient Diabetes Program Team Pager: [phone] (8a-10p)

## 2014-05-16 NOTE — Progress Notes (Signed)
Patient ID: Albert Grant, male   DOB: February 21, 1941, 73 y.o.   MRN: [id number]  [facility] Progress Note   Assessment/ Plan:   1. ESRD - TTS [facility] (followed by [facility]) with recurrent non-compliance. This is the 3rd admission with lifethreatening hyperkalemia in 2 months for refusal to go to dialysis. Seen by psychiatry who need family to help translate to decide on competency. Psych also recommend In-patient behavioral health management. The patient is agreeable to doing dialysis later today-anticipate this will change while he's on dialysis.  2. Life threatening hyperkalemia - rectified with HD  3. Anemia - no epo due to Hgb >12.  4. DM - primary service  5. Encephalopathy - Improving with HD.  6. Disposition- awaiting family to talk to psychiatry---this will help with placement/disposition. May need to involve hospice/palliative care if the family echoes the patient sentiment of stopping dialysis.  Subjective:   Notes from overnight reviewed-patient refuses medications. Currently agreeable to dialysis    Objective:   BP 166/70  Pulse 90  Temp(Src) 98.7 F (37.1 C) (Oral)  Resp 18  Ht 5\' 5"  (1.651 m)  Wt 67.4 kg (148 lb 9.4 oz)  BMI 24.73 kg/m2  SpO2 95%  Physical Exam: Gen: Comfortably resting in bed CVS: Pulse regular in rate and rhythm, S1 and S2 normal, no murmurs/rubs Resp: Clear to auscultation bilaterally-no rales/rhonchi Abd: Soft, obese, nontender and bowel sounds are normal Ext: One plus lower extremity edema bilaterally  Labs: BMET  Recent Labs Lab 05/11/14 2348 05/11/14 2354 05/12/14 0022 05/12/14 0244 05/12/14 0510 05/12/14 1042 05/13/14 0258  NA 132* 129*  --  133*  --  139 139  K 7.0* 6.9*  --  6.4* 5.9* 3.6* 4.8  CL 90* 100  --  91*  --  97 95*  CO2 19  --   --  18*  --  28 21  GLUCOSE 225* 230*  --  138*  --  53* 59*  BUN 119* 136*  --  122*  --  35* 52*  CREATININE 11.72* 12.80*  --  11.89*  --  4.95* 6.99*  CALCIUM 7.7*  --    --  9.0  --  8.6 8.4  PHOS  --   --  12.2*  --   --   --   --    CBC  Recent Labs Lab 05/11/14 2348 05/11/14 2354 05/13/14 0258  WBC 6.5  --  6.3  NEUTROABS 5.6  --  5.3  HGB 9.9* 10.5* 12.4*  HCT 30.2* 31.0* 38.2*  MCV 98.4  --  99.7  PLT 137*  --  235   Medications:    . amLODipine  10 mg Oral Daily  . aspirin  81 mg Oral Daily  . atorvastatin  80 mg Oral QHS  . buPROPion  150 mg Oral Daily  . calcium acetate  2,001 mg Oral TID WC  . calcium acetate  667 mg Oral QHS  . clopidogrel  75 mg Oral Daily  . ferrous sulfate  325 mg Oral TID WC  . hydrALAZINE  50 mg Oral 3 times per day  . insulin aspart  0-15 Units Subcutaneous 6 times per day  . isosorbide mononitrate  60 mg Oral Daily  . mirtazapine  15 mg Oral QHS  . pantoprazole  40 mg Oral Q1200   Zetta Bills, MD 05/16/2014, 11:36 AM

## 2014-05-16 NOTE — Progress Notes (Signed)
Pt gone to dialysis at this time.

## 2014-05-17 DIAGNOSIS — F0390 Unspecified dementia without behavioral disturbance: Secondary | ICD-10-CM

## 2014-05-17 DIAGNOSIS — R45851 Suicidal ideations: Secondary | ICD-10-CM

## 2014-05-17 LAB — BASIC METABOLIC PANEL
Anion gap: 17 — ABNORMAL HIGH (ref 5–15)
BUN: 61 mg/dL — AB (ref 6–23)
CALCIUM: 8.4 mg/dL (ref 8.4–10.5)
CHLORIDE: 89 meq/L — AB (ref 96–112)
CO2: 24 mEq/L (ref 19–32)
CREATININE: 7.3 mg/dL — AB (ref 0.50–1.35)
GFR, EST AFRICAN AMERICAN: 8 mL/min — AB (ref >90)
GFR, EST NON AFRICAN AMERICAN: 7 mL/min — AB (ref >90)
Glucose, Bld: 233 mg/dL — ABNORMAL HIGH (ref 70–99)
Potassium: 5.6 mEq/L — ABNORMAL HIGH (ref 3.7–5.3)
Sodium: 130 mEq/L — ABNORMAL LOW (ref 137–147)

## 2014-05-17 LAB — GLUCOSE, CAPILLARY
GLUCOSE-CAPILLARY: 151 mg/dL — AB (ref 70–99)
GLUCOSE-CAPILLARY: 80 mg/dL (ref 70–99)
Glucose-Capillary: 76 mg/dL (ref 70–99)
Glucose-Capillary: 235 mg/dL — ABNORMAL HIGH (ref 70–99)
Glucose-Capillary: 193 mg/dL — ABNORMAL HIGH (ref 70–99)
Glucose-Capillary: 175 mg/dL — ABNORMAL HIGH (ref 70–99)

## 2014-05-17 MED ORDER — INSULIN ASPART 100 UNIT/ML ~~LOC~~ SOLN
0.0000 [IU] | Freq: Three times a day (TID) | SUBCUTANEOUS | Status: DC
  Administered 2014-05-17: 2 [IU] via SUBCUTANEOUS
  Administered 2014-05-18: 5 [IU] via SUBCUTANEOUS
  Administered 2014-05-18: 2 [IU] via SUBCUTANEOUS
  Administered 2014-05-19: 3 [IU] via SUBCUTANEOUS
  Administered 2014-05-19: 5 [IU] via SUBCUTANEOUS
  Administered 2014-05-19: 1 [IU] via SUBCUTANEOUS

## 2014-05-17 NOTE — Progress Notes (Signed)
Patient ID: Albert Grant, male   DOB: 06-16-1941, 73 y.o.   MRN: [id number]  [facility] Progress Note   Assessment/ Plan:   1. ESRD - TTS [facility] (followed by [facility]) with recurrent non-compliance. Plan for next hemodialysis tomorrow if this is still on his treatment plan. Discussions today between psychiatry and patient via translator to determine competency/goals of care.  2. Anemia - no epo due to Hgb >12.  3. DM - primary service  4. Metabolic bone disease: Continue binders for phosphorus lowering-impaired by patient compliance 5. Disposition- awaiting family to talk to psychiatry---this will help with placement/disposition. May need to involve hospice/palliative care if the family echoes the patient sentiment of stopping dialysis.  Subjective:   Signed off early from dialysis yesterday-after 2 hours and 15 minutes. I attempted to discuss with the patient why he needed to complete his treatment, but he was inconsolable    Objective:   BP 178/143  Pulse 96  Temp(Src) 98.4 F (36.9 C) (Oral)  Resp 18  Ht 5\' 5"  (1.651 m)  Wt 67.8 kg (149 lb 7.6 oz)  BMI 24.87 kg/m2  SpO2 97%  Physical Exam: Gen: Comfortably resting in bed, not very engaging in conversation CVS: Pulse regular in rate and rhythm, S1 and S2 normal Resp: Coarse breath sounds bilaterally-no rales Abd: Soft, obese, nontender Ext: 1-2+ lower extremity edema  Labs: BMET  Recent Labs Lab 05/11/14 2348 05/11/14 2354 05/12/14 0022 05/12/14 0244 05/12/14 0510 05/12/14 1042 05/13/14 0258 05/16/14 1326  NA 132* 129*  --  133*  --  139 139 133*  K 7.0* 6.9*  --  6.4* 5.9* 3.6* 4.8 5.7*  CL 90* 100  --  91*  --  97 95* 92*  CO2 19  --   --  18*  --  28 21 22   GLUCOSE 225* 230*  --  138*  --  53* 59* 149*  BUN 119* 136*  --  122*  --  35* 52* 86*  CREATININE 11.72* 12.80*  --  11.89*  --  4.95* 6.99* 9.10*  CALCIUM 7.7*  --   --  9.0  --  8.6 8.4 7.9*  PHOS  --   --  12.2*  --   --   --   --   8.7*   CBC  Recent Labs Lab 05/11/14 2348 05/11/14 2354 05/13/14 0258 05/16/14 1326  WBC 6.5  --  6.3 6.8  NEUTROABS 5.6  --  5.3  --   HGB 9.9* 10.5* 12.4* 11.4*  HCT 30.2* 31.0* 38.2* 34.5*  MCV 98.4  --  99.7 96.9  PLT 137*  --  235 236   Medications:    . amLODipine  10 mg Oral Daily  . aspirin  81 mg Oral Daily  . atorvastatin  80 mg Oral QHS  . buPROPion  150 mg Oral Daily  . calcium acetate  2,001 mg Oral TID WC  . calcium acetate  667 mg Oral QHS  . clopidogrel  75 mg Oral Daily  . feeding supplement (RESOURCE BREEZE)  1 Container Oral BID BM  . ferrous sulfate  325 mg Oral TID WC  . hydrALAZINE  50 mg Oral 3 times per day  . insulin aspart  0-15 Units Subcutaneous 6 times per day  . isosorbide mononitrate  60 mg Oral Daily  . mirtazapine  15 mg Oral QHS  . pantoprazole  40 mg Oral Q1200   Zetta Bills, MD 05/17/2014, 9:27 AM

## 2014-05-17 NOTE — Clinical Social Work Psych Note (Signed)
Psych CSW arranged interpreter for psychiatrist for capacity evaluation.  [employer] [place]) will be at bedside at 2pm.  They can be reached at [phone].  Vickii Penna, LCSWA [phone]  Clinical Social Work

## 2014-05-17 NOTE — Progress Notes (Signed)
Progress Note  Albert HaberRene Grant ZOX:[id number] DOB: 01/10/1941 DOA: 05/11/2014 PCP: Karlene EinsteinASANAYAKA,[name], MD  Admit HPI / Brief Narrative: 73 yo AM PMHc ESRD on HD, CAD, DM [facility] dialysis. In ED found to be hyperkalemic at 6.9. Was given Ca++, insulin, and bicarb. Also noted to be in junctional rhythm and has been placed on external pacer. Renal following planning to diurese tonight. PCCM asked to see for admission. Of note has refused dialysis several times recently saying he just wants to die. We have requested psychiatry consultation for capacity to make medical decision and its pending.    HPI/Subjective: Afebrile, currently without any acute complaints. Patient able to answer question and follow simple commands  Assessment/Plan: ESRD T/TH/Sat  - [facility]. His noncompliance has been a recurring issue. He signs off after 2 hours of dialysis on Tuesday and did not go on Thursday; ended admitted to the hospital with hyperkalemia and fluid overload. -will follow renal service rec's -electrolytes now corrected -patient w/o complaints -plan is for HD today again 7/9  Life threatening hyperkalemia  - 3rd admission in 2 months for refusing diaysis, then being transported here for emergent dialysis. His primary neph's in [place] need to be involved in the discussion of best plan of action for this man when he refuses treatment. -Psychiatry recommended changing zoloft to welbutrin  -competency evaluation with assistance from interpreter pending -also recommending psych facility after medically stable. - bmp today for hyperkalemia.    Anemia  -epo/iron as per renal discretion -Hgb stable  DM  -Patient's CBG on the low side, with some hypoglycemic episodes -change to sensitive SSI. CBG (last 3)   Recent Labs  05/17/14 0006 05/17/14 0405 05/17/14 0747  GLUCAP 80 76 151*      Encephalopathy -pretty much clear after HD -most likely  related to uremia   Noncompliance with medical care -See above discussion   Code Status: FULL Family Communication: no family present at time of exam Disposition Plan: to be determine  Consultants: Dr. Terrial RhodesJoseph Coladonato (nephrology) Dr. Shan LevansPatrick Wright (PCCM) Dr. Olga MillersBrian Crenshaw (cardiology)  Procedure/Significant Events: 7/2 > missed dialysis, hyperkalemia  Culture NA  Antibiotics: NA  DVT prophylaxis: SCD, heparin during dialysis   Objective: VITAL SIGNS: Temp: 98.6 F (37 C) (07/08 0927) Temp src: Oral (07/08 0927) BP: 168/89 mmHg (07/08 0927) Pulse Rate: 94 (07/08 0927) SPO2; FIO2:   Intake/Output Summary (Last 24 hours) at 05/17/14 1011 Last data filed at 05/16/14 1905  Gross per 24 hour  Intake    240 ml  Output   2073 ml  Net  -1833 ml     Exam: General: AAOx2, NAD, No acute respiratory distress Lungs: Clear to auscultation bilaterally without wheezes or crackles Cardiovascular: Regular rate and rhythm without murmur gallop or rub normal S1 and S2 Abdomen: Nontender, nondistended, soft, bowel sounds positive, no rebound, no ascites, no appreciable mass Extremities: No significant cyanosis, clubbing, or edema bilateral lower extremities  Data Reviewed: Basic Metabolic Panel:  Recent Labs Lab 05/11/14 2348 05/11/14 2354 05/12/14 0022 05/12/14 0244 05/12/14 0510 05/12/14 1042 05/13/14 0258 05/16/14 1326  NA 132* 129*  --  133*  --  139 139 133*  K 7.0* 6.9*  --  6.4* 5.9* 3.6* 4.8 5.7*  CL 90* 100  --  91*  --  97 95* 92*  CO2 19  --   --  18*  --  28 21 22   GLUCOSE 225* 230*  --  138*  --  53* 59* 149*  BUN 119* 136*  --  122*  --  35* 52* 86*  CREATININE 11.72* 12.80*  --  11.89*  --  4.95* 6.99* 9.10*  CALCIUM 7.7*  --   --  9.0  --  8.6 8.4 7.9*  MG  --   --  2.8*  --   --   --   --   --   PHOS  --   --  12.2*  --   --   --   --  8.7*   Liver Function Tests:  Recent Labs Lab 05/11/14 2348 05/16/14 1326  AST 200*  --   ALT 101*   --   ALKPHOS 194*  --   BILITOT 0.5  --   PROT 6.6  --   ALBUMIN 2.9* 2.9*   CBC:  Recent Labs Lab 05/11/14 2348 05/11/14 2354 05/13/14 0258 05/16/14 1326  WBC 6.5  --  6.3 6.8  NEUTROABS 5.6  --  5.3  --   HGB 9.9* 10.5* 12.4* 11.4*  HCT 30.2* 31.0* 38.2* 34.5*  MCV 98.4  --  99.7 96.9  PLT 137*  --  235 236   CBG:  Recent Labs Lab 05/16/14 1632 05/16/14 2100 05/17/14 0006 05/17/14 0405 05/17/14 0747  GLUCAP 133* 360* 80 76 151*    Recent Results (from the past 240 hour(s))  MRSA PCR SCREENING     Status: None   Collection Time    05/12/14  3:54 AM      Result Value Ref Range Status   MRSA by PCR NEGATIVE  NEGATIVE Final   Comment:            The GeneXpert MRSA Assay (FDA     approved for NASAL specimens     only), is one component of a     comprehensive MRSA colonization     surveillance program. It is not     intended to diagnose MRSA     infection nor to guide or     monitor treatment for     MRSA infections.     Scheduled Meds:  Scheduled Meds: . amLODipine  10 mg Oral Daily  . aspirin  81 mg Oral Daily  . atorvastatin  80 mg Oral QHS  . buPROPion  150 mg Oral Daily  . calcium acetate  2,001 mg Oral TID WC  . calcium acetate  667 mg Oral QHS  . clopidogrel  75 mg Oral Daily  . feeding supplement (RESOURCE BREEZE)  1 Container Oral BID BM  . ferrous sulfate  325 mg Oral TID WC  . hydrALAZINE  50 mg Oral 3 times per day  . insulin aspart  0-15 Units Subcutaneous 6 times per day  . isosorbide mononitrate  60 mg Oral Daily  . mirtazapine  15 mg Oral QHS  . pantoprazole  40 mg Oral Q1200    Time spent on care of this patient: < 30 mins   Gelsey Amyx , MD  [phone]  If 7PM-7AM, please contact night-coverage [email or web address] Password [email or web address] 05/17/2014, 10:11 AM   LOS: 6 days

## 2014-05-17 NOTE — Evaluation (Signed)
Physical Therapy Evaluation Patient Details Name: Albert Grant MRN: [id number] DOB: 1941-02-13 Today's Date: 05/17/2014   History of Present Illness  73 yo AM PMHc ESRD on HD, CAD, DM presents to [facility] ED after missing dialysis. In ED found to be hyperkalemic at 6.9. Was given Ca++, insulin, and bicarb. Also noted to be in junctional rhythm and has been placed on external pacer. Renal following planning to diurese. PCCM asked to see for admission. Of note has refused dialysis several times recently saying he just wants to die. We have requested psychiatry consultation for capacity to make medical decision   Clinical Impression  Pt admitted with above. Pt currently with functional limitations due to the deficits listed below (see PT Problem List).  Pt will benefit from skilled PT to increase their independence and safety with mobility to allow discharge to the venue listed below.   Pt has shown a functional decline since last admission; PT follow-up at SNF is indicated (provided pt will participate) to maximize independence and safety with mobility and decr caregiver burden      Follow Up Recommendations SNF    Equipment Recommendations  Other (comment) (is wheelchair provided at SNF? I believe so.)    Recommendations for Other Services       Precautions / Restrictions Precautions Precautions: Fall      Mobility  Bed Mobility Overal bed mobility: Needs Assistance Bed Mobility: Rolling;Sidelying to Sit Rolling: Min assist Sidelying to sit: Mod assist;Max assist       General bed mobility comments: Mod assis to elevate trunk form sidelie to sit with support given in a force couple at his L pelvis and R shoulder girdle; once sitting up, he tended to R lean  Transfers Overall transfer level: Needs assistance Equipment used: 1 person hand held assist (and bil support given close to center of mass at hips/belt) Transfers: Sit to/from [employer] Sit to Stand: Max  assist;Mod assist Stand pivot transfers: Mod assist;Max assist       General transfer comment: Max assist for initial liftoff of hips from bed, with bil knees blocked and hold of gait belt; Much better rise from [facility] with good use of armrests and mod assist needed; max assist to control descent to chair after pivot BSC to recliner  Ambulation/Gait             General Gait Details: Not tested; Pt reports he hasn't walked in a "long time"  Stairs            Wheelchair Mobility    Modified Rankin (Stroke Patients Only)       Balance Overall balance assessment: Needs assistance Sitting-balance support: Bilateral upper extremity supported Sitting balance-Leahy Scale: Poor   Postural control: Right lateral lean Standing balance support: Bilateral upper extremity supported Standing balance-Leahy Scale: Poor                               Pertinent Vitals/Pain no apparent distress     Home Living Family/patient expects to be discharged to:: Skilled nursing facility                      Prior Function Level of Independence: Needs assistance         Comments: Pt has advanced dementia and unable to provide history.  no family at bedside at time of visit     Hand Dominance  Extremity/Trunk Assessment   Upper Extremity Assessment: Generalized weakness           Lower Extremity Assessment: Generalized weakness         Communication   Communication: Prefers language other than English (seems to understand limited [place])  Cognition Arousal/Alertness: Awake/alert Behavior During Therapy: WFL for tasks assessed/performed Overall Cognitive Status: No family/caregiver present to determine baseline cognitive functioning (though it seems he is at baseline)                      General Comments General comments (skin integrity, edema, etc.): [facility]    Exercises        Assessment/Plan    PT Assessment  Patient needs continued PT services  PT Diagnosis Generalized weakness   PT Problem List Decreased strength;Decreased activity tolerance;Decreased balance;Decreased mobility  PT Treatment Interventions DME instruction;Functional mobility training;Therapeutic activities;Therapeutic exercise;Gait training;Balance training;Cognitive remediation;Patient/family education   PT Goals (Current goals can be found in the Care Plan section) Acute Rehab PT Goals Patient Stated Goal: agreeable to OOB PT Goal Formulation: With patient Time For Goal Achievement: 05/31/14 Potential to Achieve Goals: Fair    Frequency Min 2X/week   Barriers to discharge        Co-evaluation               End of Session Equipment Utilized During Treatment: Gait belt Activity Tolerance: Patient tolerated treatment well Patient left: in chair;with call bell/phone within reach;with chair alarm set Nurse Communication: Mobility status         Time: 0935 (minus 7-9 minutes for bathroom needs)-1008 PT Time Calculation (min): 33 min   Charges:   PT Evaluation $Initial PT Evaluation Tier I: 1 Procedure PT Treatments $Therapeutic Activity: 8-22 mins   PT G Codes:          Olen PelGarrigan, Efrem Pitstick Hamff 05/17/2014, 10:38 AM  Van ClinesHolly Abbagail Scaff, PT  Acute Rehabilitation Services Pager [phone] Office [phone]

## 2014-05-17 NOTE — Consult Note (Signed)
[facility] Face-to-Face Psychiatry Consult   Reason for Consult:  Competency evaluation Referring Physician:  Dr. Jethro Poling Albert Grant is an 73 y.o. male. Total Time spent with patient: 45 minutes  Assessment: AXIS I:  Major Depression, Recurrent severe AXIS II:  Deferred AXIS III:   Past Medical History  Diagnosis Date  . 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 Hypertension   . CAD (coronary artery disease)      with angioplasty  . 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 Cardiomyopathy     with ejection fraction of 45 %  . Heart failure, systolic and diastolic   . Anemia   . CHF (congestive heart failure)    AXIS IV:  other psychosocial or environmental problems, problems related to social environment and problems with primary support group AXIS V:  41-50 serious symptoms  Plan:  Patient does not meet criteria for capacity to make his own medical decisions Please contact [place] if needed contact psych social service for further assistance Appreciate Nonnie Done, LCSW for arranging translator for capacity evaluation  No evidence of imminent risk to self or others at present.   Patient does not meet criteria for psychiatric inpatient admission. Supportive therapy provided about ongoing stressors. Continue Wellbutrin XL 150 mg Qam for depression Appreciate psychiatric consultation and will sign off at this time Please contact [phone] if needs further assistance  Subjective:   Albert Grant is a 73 y.o. male patient admitted with Competency evaluation for refusing dialysis  HPI: Patient is seen, chart reviewed and case discussed with Dr. Dyann Kief. Patient appeared lying in his bed, calm and fairly cooperative. Psychiatric consultation requested for capacity evaluation as he is a resident of Nursing home and refusing his dialysis. Patient has a significant barriers with language ([place]) and no family members in hospital Will ask social service to contact with family  members and ask interpreter for fair psych evaluation for competency evaluation. Patient has failed to respond appropriately simple question like orientation and he stated he wants to go to hospital while he was in hospital. He does reports feeling depressed and wants to die. He has been taking medication zoloft but seems not helping at this time, so will change to Wellbutrin for better response.  Interval History: Patient is seen with [place] Translator Mr. Prudence Davidson for this capacity evaluation. Patient has been confused, forgetful and has limited orientation, he does know his first name, last name and age. He does not know the name of the hospital, today's day of the week and unable to identify his medical problems and required treatment for his health problems. Patient stated that he is living alone and pay his own bills and after few minutes stated that he has been staying in a nursing home for the last two years etc. Patient stated that he has some depression but denied being suicidal or homicidal ideation or psychosis. Based on my evaluation patient does not meet criteria for capacity to make his own medical decisions and will recommend to contact [facility] or psych social service if needed.  Medical history: 73 year old male with PMH as below, which includes ESRD on HD, CAD, and DM. He refused dialysis 7/2, which he has [place], stating that he just wants to die. After missing dialysis he had an aleration in his mental status and was brought to [facility] ED. In ED found to be hyperkalemic at 6.9.  Was given Ca++, insulin, and bicarb. Also noted to be in junctional rhythm and has been placed on external pacer. Renal following, planning to diurese tonight. PCCM asked to see for admission  HPI Elements:   Location:  depression. Quality:  poor. Severity:  chronic and acute. Timing:  multi factorial.  Past Psychiatric History: Past Medical History  Diagnosis Date  . Diabetes  mellitus without complication     Type II  . Hypertension   . CAD (coronary artery disease)      with angioplasty  . 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 Cardiomyopathy     with ejection fraction of 45 %  . Heart failure, systolic and diastolic   . Anemia   . CHF (congestive heart failure)     reports that he has never smoked. He has never used smokeless tobacco. He reports that he does not drink alcohol or use illicit drugs. History reviewed. No pertinent family history.         Allergies:   Allergies  Allergen Reactions  . Lisinopril Other (See Comments)    Per MAR    ACT Assessment Complete:  No Objective: Blood pressure 168/89, pulse 94, temperature 98.6 F (37 C), temperature source Oral, resp. rate 18, height 5' 5"  (1.651 m), weight 67.8 kg (149 lb 7.6 oz), SpO2 97.00%.Body mass index is 24.87 kg/(m^2). Results for orders placed during the hospital encounter of 05/11/14 (from the past 72 hour(s))  GLUCOSE, CAPILLARY     Status: Abnormal   Collection Time    05/14/14  5:36 PM      Result Value Ref Range   Glucose-Capillary 168 (*) 70 - 99 mg/dL  GLUCOSE, CAPILLARY     Status: Abnormal   Collection Time    05/14/14  7:59 PM      Result Value Ref Range   Glucose-Capillary 265 (*) 70 - 99 mg/dL  GLUCOSE, CAPILLARY     Status: Abnormal   Collection Time    05/15/14 12:02 AM      Result Value Ref Range   Glucose-Capillary 166 (*) 70 - 99 mg/dL  GLUCOSE, CAPILLARY     Status: Abnormal   Collection Time    05/15/14  3:34 AM      Result Value Ref Range   Glucose-Capillary 128 (*) 70 - 99 mg/dL  GLUCOSE, CAPILLARY     Status: None   Collection Time    05/15/14  7:25 AM      Result Value Ref Range   Glucose-Capillary 94  70 - 99 mg/dL  GLUCOSE, CAPILLARY     Status: Abnormal   Collection Time    05/15/14 11:34 AM      Result Value Ref Range   Glucose-Capillary 241 (*) 70 - 99 mg/dL  GLUCOSE, CAPILLARY     Status: Abnormal   Collection Time     05/15/14  4:00 PM      Result Value Ref Range   Glucose-Capillary 300 (*) 70 - 99 mg/dL  GLUCOSE, CAPILLARY     Status: Abnormal   Collection Time    05/15/14  8:39 PM      Result Value Ref Range   Glucose-Capillary 158 (*) 70 - 99 mg/dL  GLUCOSE, CAPILLARY     Status: Abnormal   Collection Time    05/16/14 12:14 AM      Result Value Ref Range   Glucose-Capillary 167 (*) 70 - 99 mg/dL  GLUCOSE, CAPILLARY  Status: Abnormal   Collection Time    05/16/14  4:24 AM      Result Value Ref Range   Glucose-Capillary 113 (*) 70 - 99 mg/dL  GLUCOSE, CAPILLARY     Status: Abnormal   Collection Time    05/16/14  8:00 AM      Result Value Ref Range   Glucose-Capillary 144 (*) 70 - 99 mg/dL   Comment 1 Notify RN     Comment 2 Documented in Chart    GLUCOSE, CAPILLARY     Status: Abnormal   Collection Time    05/16/14 11:42 AM      Result Value Ref Range   Glucose-Capillary 156 (*) 70 - 99 mg/dL  CBC     Status: Abnormal   Collection Time    05/16/14  1:26 PM      Result Value Ref Range   WBC 6.8  4.0 - 10.5 K/uL   RBC 3.56 (*) 4.22 - 5.81 MIL/uL   Hemoglobin 11.4 (*) 13.0 - 17.0 g/dL   HCT 34.5 (*) 39.0 - 52.0 %   MCV 96.9  78.0 - 100.0 fL   MCH 32.0  26.0 - 34.0 pg   MCHC 33.0  30.0 - 36.0 g/dL   RDW 12.9  11.5 - 15.5 %   Platelets 236  150 - 400 K/uL  RENAL FUNCTION PANEL     Status: Abnormal   Collection Time    05/16/14  1:26 PM      Result Value Ref Range   Sodium 133 (*) 137 - 147 mEq/L   Potassium 5.7 (*) 3.7 - 5.3 mEq/L   Chloride 92 (*) 96 - 112 mEq/L   CO2 22  19 - 32 mEq/L   Glucose, Bld 149 (*) 70 - 99 mg/dL   BUN 86 (*) 6 - 23 mg/dL   Creatinine, Ser 9.10 (*) 0.50 - 1.35 mg/dL   Calcium 7.9 (*) 8.4 - 10.5 mg/dL   Phosphorus 8.7 (*) 2.3 - 4.6 mg/dL   Albumin 2.9 (*) 3.5 - 5.2 g/dL   GFR calc non Af Amer 5 (*) >90 mL/min   GFR calc Af Amer 6 (*) >90 mL/min   Comment: (NOTE)     The eGFR has been calculated using the CKD EPI equation.     This calculation  has not been validated in all clinical situations.     eGFR's persistently <90 mL/min signify possible Chronic Kidney     Disease.   Anion gap 19 (*) 5 - 15  GLUCOSE, CAPILLARY     Status: Abnormal   Collection Time    05/16/14  4:32 PM      Result Value Ref Range   Glucose-Capillary 133 (*) 70 - 99 mg/dL   Comment 1 Notify RN     Comment 2 Documented in Chart    GLUCOSE, CAPILLARY     Status: Abnormal   Collection Time    05/16/14  9:00 PM      Result Value Ref Range   Glucose-Capillary 360 (*) 70 - 99 mg/dL  GLUCOSE, CAPILLARY     Status: None   Collection Time    05/17/14 12:06 AM      Result Value Ref Range   Glucose-Capillary 80  70 - 99 mg/dL  GLUCOSE, CAPILLARY     Status: None   Collection Time    05/17/14  4:05 AM      Result Value Ref Range   Glucose-Capillary 76  70 -  99 mg/dL  GLUCOSE, CAPILLARY     Status: Abnormal   Collection Time    05/17/14  7:47 AM      Result Value Ref Range   Glucose-Capillary 151 (*) 70 - 99 mg/dL   Comment 1 Notify RN     Comment 2 Documented in Chart    GLUCOSE, CAPILLARY     Status: Abnormal   Collection Time    05/17/14 11:54 AM      Result Value Ref Range   Glucose-Capillary 235 (*) 70 - 99 mg/dL   Comment 1 Notify RN     Comment 2 Documented in Chart     Labs are reviewed and are pertinent for electrolyte abnormalities including hyperkalemia.  Current Facility-Administered Medications  Medication Dose Route Frequency Provider Last Rate Last Dose  . 0.9 %  sodium chloride infusion  250 mL Intravenous PRN Corey Harold, NP      . amLODipine (NORVASC) tablet 10 mg  10 mg Oral Daily Elsie Stain, MD   10 mg at 05/17/14 0925  . aspirin chewable tablet 81 mg  81 mg Oral Daily Corey Harold, NP   81 mg at 05/17/14 0932  . atorvastatin (LIPITOR) tablet 80 mg  80 mg Oral QHS Corey Harold, NP   80 mg at 05/16/14 2120  . buPROPion (WELLBUTRIN XL) 24 hr tablet 150 mg  150 mg Oral Daily Durward Parcel, MD   150 mg at  05/17/14 0926  . calcium acetate (PHOSLO) capsule 2,001 mg  2,001 mg Oral TID WC Corey Harold, NP   2,001 mg at 05/17/14 0924  . calcium acetate (PHOSLO) capsule 667 mg  667 mg Oral QHS Rise Patience, MD   667 mg at 05/16/14 2120  . clopidogrel (PLAVIX) tablet 75 mg  75 mg Oral Daily Corey Harold, NP   75 mg at 05/17/14 0925  . feeding supplement (RESOURCE BREEZE) (RESOURCE BREEZE) liquid 1 Container  1 Container Oral BID BM Erlene Quan, RD   1 Container at 05/17/14 [phone]  . ferrous sulfate tablet 325 mg  325 mg Oral TID WC Corey Harold, NP   325 mg at 05/17/14 4782  . heparin injection 5,900 Units  100 Units/kg Dialysis PRN Rexene Agent, MD      . hydrALAZINE (APRESOLINE) tablet 50 mg  50 mg Oral 3 times per day Elsie Stain, MD   50 mg at 05/16/14 2120  . insulin aspart (novoLOG) injection 0-9 Units  0-9 Units Subcutaneous TID WC Hosie Poisson, MD      . isosorbide mononitrate (IMDUR) 24 hr tablet 60 mg  60 mg Oral Daily Corey Harold, NP   60 mg at 05/17/14 0926  . mirtazapine (REMERON) tablet 15 mg  15 mg Oral QHS Corey Harold, NP   15 mg at 05/16/14 2120  . morphine 2 MG/ML injection 2-4 mg  2-4 mg Intravenous Q2H PRN Elsie Stain, MD   2 mg at 05/12/14 0834  . oxyCODONE-acetaminophen (PERCOCET/ROXICET) 5-325 MG per tablet 1-2 tablet  1-2 tablet Oral Q6H PRN Corey Harold, NP      . pantoprazole (PROTONIX) EC tablet 40 mg  40 mg Oral Q1200 Elsie Stain, MD   40 mg at 05/16/14 1626    Psychiatric Specialty Exam: Physical Exam  ROS  Blood pressure 168/89, pulse 94, temperature 98.6 F (37 C), temperature source Oral, resp. rate 18, height 5' 5"  (1.651 m), weight  67.8 kg (149 lb 7.6 oz), SpO2 97.00%.Body mass index is 24.87 kg/(m^2).  General Appearance: Guarded  Eye Contact::  Fair  Speech:  Blocked, Garbled, Slow and Slurred  Volume:  Decreased  Mood:  Depressed, Hopeless and Worthless  Affect:  Depressed and Flat  Thought Process:  Disorganized   Orientation:  Full (Time, Place, and Person)  Thought Content:  Rumination  Suicidal Thoughts:  Yes.  without intent/plan  Homicidal Thoughts:  No  Memory:  Immediate;   Poor  Judgement:  Impaired  Insight:  Lacking  Psychomotor Activity:  Psychomotor Retardation  Concentration:  Fair  Recall:  [place] of Knowledge:Poor  Language: Fair  Akathisia:  NA  Handed:  Right  AIMS (if indicated):     Assets:  Financial Resources/Insurance Housing Intimacy Leisure Time Resilience Social Support  Sleep:      Musculoskeletal: Strength & Muscle Tone: within normal limits Gait & Station: unable to stand Patient leans: N/A   Treatment Plan Summary: Daily contact with patient to assess and evaluate symptoms and progress in treatment Medication management  Albert Grant,JANARDHAHA R. 05/17/2014 2:38 PM

## 2014-05-18 LAB — RENAL FUNCTION PANEL
ANION GAP: 17 — AB (ref 5–15)
Albumin: 2.9 g/dL — ABNORMAL LOW (ref 3.5–5.2)
BUN: 75 mg/dL — ABNORMAL HIGH (ref 6–23)
CHLORIDE: 93 meq/L — AB (ref 96–112)
CO2: 22 mEq/L (ref 19–32)
Calcium: 8.3 mg/dL — ABNORMAL LOW (ref 8.4–10.5)
Creatinine, Ser: 8.54 mg/dL — ABNORMAL HIGH (ref 0.50–1.35)
GFR calc non Af Amer: 5 mL/min — ABNORMAL LOW (ref >90)
GFR, EST AFRICAN AMERICAN: 6 mL/min — AB (ref >90)
GLUCOSE: 172 mg/dL — AB (ref 70–99)
PHOSPHORUS: 6.8 mg/dL — AB (ref 2.3–4.6)
POTASSIUM: 6.9 meq/L — AB (ref 3.7–5.3)
SODIUM: 132 meq/L — AB (ref 137–147)

## 2014-05-18 LAB — BASIC METABOLIC PANEL
Anion gap: 13 (ref 5–15)
BUN: 34 mg/dL — ABNORMAL HIGH (ref 6–23)
CO2: 27 mEq/L (ref 19–32)
Calcium: 8.5 mg/dL (ref 8.4–10.5)
Chloride: 94 mEq/L — ABNORMAL LOW (ref 96–112)
Creatinine, Ser: 5.05 mg/dL — ABNORMAL HIGH (ref 0.50–1.35)
GFR calc Af Amer: 12 mL/min — ABNORMAL LOW (ref >90)
GFR, EST NON AFRICAN AMERICAN: 10 mL/min — AB (ref >90)
Glucose, Bld: 148 mg/dL — ABNORMAL HIGH (ref 70–99)
Potassium: 4.9 mEq/L (ref 3.7–5.3)
SODIUM: 134 meq/L — AB (ref 137–147)

## 2014-05-18 LAB — GLUCOSE, CAPILLARY
GLUCOSE-CAPILLARY: 277 mg/dL — AB (ref 70–99)
Glucose-Capillary: 155 mg/dL — ABNORMAL HIGH (ref 70–99)
Glucose-Capillary: 120 mg/dL — ABNORMAL HIGH (ref 70–99)

## 2014-05-18 LAB — CBC
HCT: 32.7 % — ABNORMAL LOW (ref 39.0–52.0)
HEMOGLOBIN: 10.9 g/dL — AB (ref 13.0–17.0)
MCH: 32.5 pg (ref 26.0–34.0)
MCHC: 33.3 g/dL (ref 30.0–36.0)
MCV: 97.6 fL (ref 78.0–100.0)
Platelets: 216 10*3/uL (ref 150–400)
RBC: 3.35 MIL/uL — ABNORMAL LOW (ref 4.22–5.81)
RDW: 13.1 % (ref 11.5–15.5)
WBC: 10.3 10*3/uL (ref 4.0–10.5)

## 2014-05-18 MED ORDER — LABETALOL HCL 5 MG/ML IV SOLN
10.0000 mg | INTRAVENOUS | Status: DC | PRN
  Administered 2014-05-18: 10 mg via INTRAVENOUS
  Filled 2014-05-18: qty 4

## 2014-05-18 NOTE — Progress Notes (Signed)
Patient ID: Albert Grant, male   DOB: Nov 03, 1941, 73 y.o.   MRN: [id number]  [facility] Progress Note   Assessment/ Plan:   1. ESRD - TTS [facility] (followed by [facility]) with recurrent non-compliance. HD today (Psych notes reviewed- he lacks capacity to make decisions). Will need to have his son accompany him to HD so he does not sign off early. 2. Anemia - no epo due to Hgb >12.  3. DM - primary service  4. Metabolic bone disease: Continue binders for phosphorus lowering-impaired by patient compliance  5. Disposition- awaiting family to talk to psychiatry---this will help with placement/disposition. May need to involve hospice/palliative care if the family echoes the patient sentiment of stopping dialysis.   Subjective:   No acute events overnight   Objective:   BP 186/97  Pulse 100  Temp(Src) 98 F (36.7 C) (Oral)  Resp 16  Ht 5\' 5"  (1.651 m)  Wt 66.5 kg (146 lb 9.7 oz)  BMI 24.40 kg/m2  SpO2 95%  Physical Exam: OMB:TDHRCBULAGT on HD XMI:WOEHO regular tachycardia, normal S1 and S2 Resp:Coarse bilaterally, no rales/rhonchi ZYY:QMGN, obese, NT, BS normal Ext:1-2+ LE edema  Labs: BMET  Recent Labs Lab 05/11/14 2348 05/11/14 2354 05/12/14 0022 05/12/14 0244 05/12/14 0510 05/12/14 1042 05/13/14 0258 05/16/14 1326 05/17/14 1450 05/18/14 0812  NA 132* 129*  --  133*  --  139 139 133* 130* 132*  K 7.0* 6.9*  --  6.4* 5.9* 3.6* 4.8 5.7* 5.6* 6.9*  CL 90* 100  --  91*  --  97 95* 92* 89* 93*  CO2 19  --   --  18*  --  28 21 22 24 22   GLUCOSE 225* 230*  --  138*  --  53* 59* 149* 233* 172*  BUN 119* 136*  --  122*  --  35* 52* 86* 61* 75*  CREATININE 11.72* 12.80*  --  11.89*  --  4.95* 6.99* 9.10* 7.30* 8.54*  CALCIUM 7.7*  --   --  9.0  --  8.6 8.4 7.9* 8.4 8.3*  PHOS  --   --  12.2*  --   --   --   --  8.7*  --  6.8*   CBC  Recent Labs Lab 05/11/14 2348 05/11/14 2354 05/13/14 0258 05/16/14 1326 05/18/14 0812  WBC 6.5  --  6.3 6.8 10.3   NEUTROABS 5.6  --  5.3  --   --   HGB 9.9* 10.5* 12.4* 11.4* 10.9*  HCT 30.2* 31.0* 38.2* 34.5* 32.7*  MCV 98.4  --  99.7 96.9 97.6  PLT 137*  --  235 236 216   Medications:    . amLODipine  10 mg Oral Daily  . aspirin  81 mg Oral Daily  . atorvastatin  80 mg Oral QHS  . buPROPion  150 mg Oral Daily  . calcium acetate  2,001 mg Oral TID WC  . calcium acetate  667 mg Oral QHS  . clopidogrel  75 mg Oral Daily  . feeding supplement (RESOURCE BREEZE)  1 Container Oral BID BM  . ferrous sulfate  325 mg Oral TID WC  . hydrALAZINE  50 mg Oral 3 times per day  . insulin aspart  0-9 Units Subcutaneous TID WC  . isosorbide mononitrate  60 mg Oral Daily  . mirtazapine  15 mg Oral QHS  . pantoprazole  40 mg Oral Q1200   Zetta Bills, MD 05/18/2014, 9:28 AM

## 2014-05-18 NOTE — Progress Notes (Signed)
Progress Note  Albert Grant ERX:[id number] DOB: November 19, 1940 DOA: 05/11/2014 PCP: Karlene Einstein, MD  Admit HPI / Brief Narrative: 73 yo AM PMHc ESRD on HD, CAD, DM presents to [facility] ED after missing dialysis. In ED found to be hyperkalemic at 6.9. Was given Ca++, insulin, and bicarb. Also noted to be in junctional rhythm and has been placed on external pacer. Renal following planning to diurese tonight. PCCM asked to see for admission. Of note has refused dialysis several times recently saying he just wants to die. Psychiatry consulted and deemed he does not have capacity to make medical decisions. Called his son twice and left message to talk to him and also called his daughter. Plan to discuss with family regarding HD and the need for the family to be present at the time HD,so he doesn t sign off early. If the family decides not to go ahead withHD, then will need to get palliative care involved for goals of care.    HPI/Subjective: Afebrile, currently without any acute complaints. Patient able to answer question and follow simple commands  Assessment/Plan: ESRD T/TH/Sat  - TTS [facility] and [place]. His noncompliance has been a recurring issue. He signs off after 2 hours of dialysis on Tuesday and did not go on Thursday; ended being admitted to the hospital with hyperkalemia and fluid overload. -will follow renal service rec's -electrolytes not yet corrected.  -patient w/o complaints -plan is for HD today again 7/9. Repeat potassium tonight.   Life threatening hyperkalemia  - 3rd admission in 2 months for refusing diaysis, then being transported here for emergent dialysis. His primary neph's in [place] need to be involved in the discussion of best plan of action for this man when he refuses treatment. -Psychiatry recommended changing zoloft to welbutrin  -competency evaluation with assistance from interpreter done and he does not have capacity.  - bmp today for hyperkalemia.     Anemia  -epo/iron as per renal discretion -Hgb stable  DM  -Patient's CBG on the low side, with some hypoglycemic episodes -change to sensitive SSI. CBG (last 3)   Recent Labs  05/17/14 1154 05/17/14 1657 05/17/14 2214  GLUCAP 235* 193* 175*      Encephalopathy -pretty much clear after HD -most likely related to uremia   Noncompliance with medical care -See above discussion   Code Status: FULL Family Communication: no family present at time of exam Disposition Plan: to be determine  Consultants: Dr. Terrial Rhodes (nephrology) Dr. Shan Levans (PCCM) Dr. Olga Millers (cardiology)  Procedure/Significant Events: 7/2 > missed dialysis, hyperkalemia  Culture NA  Antibiotics: NA  DVT prophylaxis: SCD, heparin during dialysis   Objective: VITAL SIGNS: Temp: 99.4 F (37.4 C) (07/09 1145) Temp src: Oral (07/09 1145) BP: 181/92 mmHg (07/09 1145) Pulse Rate: 106 (07/09 1145) SPO2; FIO2:   Intake/Output Summary (Last 24 hours) at 05/18/14 1433 Last data filed at 05/18/14 1145  Gross per 24 hour  Intake    120 ml  Output   3200 ml  Net  -3080 ml     Exam: General: AAOx2, NAD, No acute respiratory distress Lungs: Clear to auscultation bilaterally without wheezes or crackles Cardiovascular: Regular rate and rhythm without murmur gallop or rub normal S1 and S2 Abdomen: Nontender, nondistended, soft, bowel sounds positive, no rebound, no ascites, no appreciable mass Extremities: No significant cyanosis, clubbing, or edema bilateral lower extremities  Data Reviewed: Basic Metabolic Panel:  Recent Labs Lab 05/12/14 0022  05/12/14 1042 05/13/14 0258 05/16/14 1326 05/17/14  1450 05/18/14 0812  NA  --   < > 139 139 133* 130* 132*  K  --   < > 3.6* 4.8 5.7* 5.6* 6.9*  CL  --   < > 97 95* 92* 89* 93*  CO2  --   < > 28 21 22 24 22   GLUCOSE  --   < > 53* 59* 149* 233* 172*  BUN  --   < > 35* 52* 86* 61* 75*  CREATININE  --   < > 4.95* 6.99*  9.10* 7.30* 8.54*  CALCIUM  --   < > 8.6 8.4 7.9* 8.4 8.3*  MG 2.8*  --   --   --   --   --   --   PHOS 12.2*  --   --   --  8.7*  --  6.8*  < > = values in this interval not displayed. Liver Function Tests:  Recent Labs Lab 05/11/14 2348 05/16/14 1326 05/18/14 0812  AST 200*  --   --   ALT 101*  --   --   ALKPHOS 194*  --   --   BILITOT 0.5  --   --   PROT 6.6  --   --   ALBUMIN 2.9* 2.9* 2.9*   CBC:  Recent Labs Lab 05/11/14 2348 05/11/14 2354 05/13/14 0258 05/16/14 1326 05/18/14 0812  WBC 6.5  --  6.3 6.8 10.3  NEUTROABS 5.6  --  5.3  --   --   HGB 9.9* 10.5* 12.4* 11.4* 10.9*  HCT 30.2* 31.0* 38.2* 34.5* 32.7*  MCV 98.4  --  99.7 96.9 97.6  PLT 137*  --  235 236 216   CBG:  Recent Labs Lab 05/17/14 0405 05/17/14 0747 05/17/14 1154 05/17/14 1657 05/17/14 2214  GLUCAP 76 151* 235* 193* 175*    Recent Results (from the past 240 hour(s))  MRSA PCR SCREENING     Status: None   Collection Time    05/12/14  3:54 AM      Result Value Ref Range Status   MRSA by PCR NEGATIVE  NEGATIVE Final   Comment:            The GeneXpert MRSA Assay (FDA     approved for NASAL specimens     only), is one component of a     comprehensive MRSA colonization     surveillance program. It is not     intended to diagnose MRSA     infection nor to guide or     monitor treatment for     MRSA infections.     Scheduled Meds:  Scheduled Meds: . amLODipine  10 mg Oral Daily  . aspirin  81 mg Oral Daily  . atorvastatin  80 mg Oral QHS  . buPROPion  150 mg Oral Daily  . calcium acetate  2,001 mg Oral TID WC  . calcium acetate  667 mg Oral QHS  . clopidogrel  75 mg Oral Daily  . feeding supplement (RESOURCE BREEZE)  1 Container Oral BID BM  . ferrous sulfate  325 mg Oral TID WC  . hydrALAZINE  50 mg Oral 3 times per day  . insulin aspart  0-9 Units Subcutaneous TID WC  . isosorbide mononitrate  60 mg Oral Daily  . mirtazapine  15 mg Oral QHS  . pantoprazole  40 mg Oral  Q1200    Time spent on care of this patient: 30 mins   Derwood Becraft , MD  [phone]  If 7PM-7AM, please contact night-coverage [email or web address] Password [email or web address] 05/18/2014, 2:33 PM   LOS: 7 days

## 2014-05-18 NOTE — Progress Notes (Signed)
BP 200 systollic gave 10 mg IV labetalol

## 2014-05-18 NOTE — Clinical Social Work Note (Signed)
CSW set up meeting with son, [place] Fus and [facility] for 05/19/2014 at 10am.  Meeting to discuss: -possible son/family to be present during the time of HD at hospital -possible son/family to be present during the transportation and HD once pt has been dc'd to SNF -per consultation with Medical Director, pt cannot be restrained or forced to undergo HD.    Vickii Penna, LCSWA [phone]  Clinical Social Work

## 2014-05-18 NOTE — Progress Notes (Signed)
Received critical K+ lab of 6.9. Dr. Allena Katz made aware. Pt put on 1K bath for 2 hours, then back to 2K for remainder of tx.

## 2014-05-18 NOTE — Procedures (Signed)
Patient seen on Hemodialysis. QB 400, UF goal 4L Treatment adjusted as needed.  Zetta Bills MD [facility]. Office # [phone] Pager # [phone] 9:27 AM

## 2014-05-18 NOTE — Progress Notes (Signed)
Bp re-check 154/63 hr 87

## 2014-05-19 LAB — BASIC METABOLIC PANEL
ANION GAP: 17 — AB (ref 5–15)
BUN: 49 mg/dL — ABNORMAL HIGH (ref 6–23)
CO2: 24 mEq/L (ref 19–32)
Calcium: 8.3 mg/dL — ABNORMAL LOW (ref 8.4–10.5)
Chloride: 91 mEq/L — ABNORMAL LOW (ref 96–112)
Creatinine, Ser: 6.61 mg/dL — ABNORMAL HIGH (ref 0.50–1.35)
GFR, EST AFRICAN AMERICAN: 9 mL/min — AB (ref >90)
GFR, EST NON AFRICAN AMERICAN: 7 mL/min — AB (ref >90)
Glucose, Bld: 286 mg/dL — ABNORMAL HIGH (ref 70–99)
Potassium: 5 mEq/L (ref 3.7–5.3)
Sodium: 132 mEq/L — ABNORMAL LOW (ref 137–147)

## 2014-05-19 LAB — GLUCOSE, CAPILLARY
GLUCOSE-CAPILLARY: 300 mg/dL — AB (ref 70–99)
Glucose-Capillary: 134 mg/dL — ABNORMAL HIGH (ref 70–99)
Glucose-Capillary: 235 mg/dL — ABNORMAL HIGH (ref 70–99)

## 2014-05-19 MED ORDER — BOOST / RESOURCE BREEZE PO LIQD
1.0000 | Freq: Two times a day (BID) | ORAL | Status: AC
Start: 2014-05-19 — End: ?

## 2014-05-19 MED ORDER — AMLODIPINE BESYLATE 10 MG PO TABS: 10.0000 mg | ORAL_TABLET | Freq: Every day | ORAL | Status: AC

## 2014-05-19 MED ORDER — INSULIN ASPART 100 UNIT/ML ~~LOC~~ SOLN: SUBCUTANEOUS | Status: AC

## 2014-05-19 MED ORDER — BUPROPION HCL ER (XL) 150 MG PO TB24
150.0000 mg | ORAL_TABLET | Freq: Every day | ORAL | Status: AC
Start: 2014-05-19 — End: ?

## 2014-05-19 NOTE — Progress Notes (Signed)
Report called to [facility], [place].  Called [facility] for transportation back to [facility].

## 2014-05-19 NOTE — Progress Notes (Signed)
Physical Therapy Treatment Patient Details Name: Albert HaberRene Damiano MRN: [id number] DOB: 03/08/1941 Today's Date: 05/19/2014    History of Present Illness Pt. was admitted from HD on 05/11/14 with life threatening hyperkalemia from noncompliance with HD.  Pt, also with ESRD, DM, acute encephalopathy, hypertensive heart disease, cardiomyopathy, and medical noncompliance.      PT Comments    It is difficult to tell if pt. Is giving any effort with mobility attempt today.  Sitting balance zero.  Was able to roll side to side once back down in supine position so believe he ws self limiting in terms of moving side<>sit.    Follow Up Recommendations  SNF     Equipment Recommendations  None recommended by PT    Recommendations for Other Services       Precautions / Restrictions Precautions Precautions: Fall Restrictions Weight Bearing Restrictions: No    Mobility  Bed Mobility Overal bed mobility: Needs Assistance Bed Mobility: Rolling;Sidelying to Sit;Sit to Sidelying Rolling: Supervision Sidelying to sit: Max assist     Sit to sidelying: Max assist General bed mobility comments: Pt. able to roll either direction in bed with use of bedrail and supervision/verbal cueing.  Pt. needed max assist to elevate trunk to upright position at edge of bed and use of bed pad for hip positioning at edge of bed.  Pt. needed heavy assist to maintain sitting at edge of bed.  Made gestures to lie back down often during brief time up at EOB. Three trials of side <>sit all requiring max assist.    Transfers Overall transfer level:  (pt. unwilling to attempt)                  Ambulation/Gait                 Stairs            Wheelchair Mobility    Modified Rankin (Stroke Patients Only)       Balance Overall balance assessment: Needs assistance Sitting-balance support: Bilateral upper extremity supported;Feet supported Sitting balance-Leahy Scale: Zero Sitting balance -  Comments: P.t needed full assist from therapist to remain upright at edge of bed.  Balance does seem imparied but pt. also made effort to lie back down Postural control: Right lateral lean                          Cognition Arousal/Alertness: Awake/alert Behavior During Therapy: WFL for tasks assessed/performed Overall Cognitive Status: No family/caregiver present to determine baseline cognitive functioning                      Exercises General Exercises - Lower Extremity Ankle Circles/Pumps: AAROM;Both;5 reps Long Arc Quad: AAROM;Both;5 reps    General Comments        Pertinent Vitals/Pain See vitals tab No pain, no distress    Home Living                      Prior Function            PT Goals (current goals can now be found in the care plan section) Progress towards PT goals: Progressing toward goals    Frequency  Min 2X/week    PT Plan Current plan remains appropriate    Co-evaluation             End of Session   Activity Tolerance: Patient limited by fatigue;Other (comment) (limited  by lack of motivation to participate) Patient left: in bed;with call bell/phone within reach;with bed alarm set     Time: 1142-1205 PT Time Calculation (min): 23 min  Charges:  $Therapeutic Activity: 23-37 mins                    G Codes:      Ferman Hamming 05/19/2014, 12:25 PM Weldon Picking PT Acute Rehab Services [phone] Beeper [phone]

## 2014-05-19 NOTE — Progress Notes (Signed)
Pt FL2 signed by Dr. Blake Divine.

## 2014-05-19 NOTE — Progress Notes (Signed)
Patient ID: Albert Grant, male   DOB: 21-Sep-1941, 73 y.o.   MRN: [id number]  [facility] Progress Note    Assessment/ Plan:   1. ESRD - TTS [facility] (followed by [facility]) with recurrent non-compliance. HD today (Psych notes reviewed- he lacks capacity to make decisions). Will need to have his son accompany him to HD so he does not sign off early. Anticipate discharge to SNF soon but son needs to be aware that it is his responsibility to make sure that his father does not sign off of dialysis and is compliant with treatment-if he cannot manage this, will need to seek palliative care 2. Anemia - no epo due to Hgb >12.  3. DM - primary service  4. Metabolic bone disease: Continue binders for phosphorus lowering-impaired by patient compliance  5. Disposition- likely to be discharged back to skilled nursing facility but will need sounds assistance in making sure he goes to dialysis/stays on dialysis.  Subjective:   Reports to be feeling fair-denies any complaints. When inquired as to whether he did not have labs done this morning he simply states "I don't need".    Objective:   BP 113/55  Pulse 84  Temp(Src) 97.4 F (36.3 C) (Oral)  Resp 18  Ht 5\' 5"  (1.651 m)  Wt 65.1 kg (143 lb 8.3 oz)  BMI 23.88 kg/m2  SpO2 95%  Intake/Output Summary (Last 24 hours) at 05/19/14 0942 Last data filed at 05/18/14 1145  Gross per 24 hour  Intake      0 ml  Output   3000 ml  Net  -3000 ml   Weight change: -0.9 kg (-1 lb 15.8 oz)  Physical Exam: Gen: Comfortably resting in bed, easy to awaken and engaging conversation CVS: Pulse regular in rate and rhythm, S1 and S2 normal Resp: Clear to auscultation bilaterally-no rales Abd: Soft, flat, nontender and bowel sounds normal Ext: Trace to 1+ lower extremity edema  Imaging: No results found.  Labs: BMET  Recent Labs Lab 05/12/14 1042 05/13/14 0258 05/16/14 1326 05/17/14 1450 05/18/14 0812 05/18/14 2012  NA 139 139 133*  130* 132* 134*  K 3.6* 4.8 5.7* 5.6* 6.9* 4.9  CL 97 95* 92* 89* 93* 94*  CO2 28 21 22 24 22 27   GLUCOSE 53* 59* 149* 233* 172* 148*  BUN 35* 52* 86* 61* 75* 34*  CREATININE 4.95* 6.99* 9.10* 7.30* 8.54* 5.05*  CALCIUM 8.6 8.4 7.9* 8.4 8.3* 8.5  PHOS  --   --  8.7*  --  6.8*  --    CBC  Recent Labs Lab 05/13/14 0258 05/16/14 1326 05/18/14 0812  WBC 6.3 6.8 10.3  NEUTROABS 5.3  --   --   HGB 12.4* 11.4* 10.9*  HCT 38.2* 34.5* 32.7*  MCV 99.7 96.9 97.6  PLT 235 236 216    Medications:    . amLODipine  10 mg Oral Daily  . aspirin  81 mg Oral Daily  . atorvastatin  80 mg Oral QHS  . buPROPion  150 mg Oral Daily  . calcium acetate  2,001 mg Oral TID WC  . calcium acetate  667 mg Oral QHS  . clopidogrel  75 mg Oral Daily  . feeding supplement (RESOURCE BREEZE)  1 Container Oral BID BM  . ferrous sulfate  325 mg Oral TID WC  . hydrALAZINE  50 mg Oral 3 times per day  . insulin aspart  0-9 Units Subcutaneous TID WC  . isosorbide mononitrate  60 mg  Oral Daily  . mirtazapine  15 mg Oral QHS  . pantoprazole  40 mg Oral Q1200   Zetta BillsJay Milanie Rosenfield, MD 05/19/2014, 9:42 AM

## 2014-05-19 NOTE — Discharge Summary (Signed)
Physician Discharge Summary  Albert HaberRene Grant ZOX:[id number] DOB: 09/07/1941 DOA: 05/11/2014  PCP: Karlene EinsteinASANAYAKA,[name], MD  Admit date: 05/11/2014 Discharge date: 05/19/2014  Time spent: 35 minutes  Recommendations for Outpatient Follow-up:  1. Please follow up with nephrologist as recommended 2. Please call the son if the patient refuses to go to HD or if the patient tries to sign off early from the HD treatment , as pt does not have capacity to make medical decisions for himself.  3. He is being discharged to SNF.  4. Please follow up with PCP in 1 to 2 weeks.   Discharge Diagnoses:  Principal Problem:   Hyperkalemia Active Problems:   Cardiomyopathy, ischemic   DM type 2 causing renal disease   Hypertensive heart disease   Acute encephalopathy   Malnutrition of moderate degree   Chronic systolic heart failure   Bradycardia   Hypertension   ESRD (end stage renal disease) on dialysis   Medically noncompliant   Discharge Condition: improved.     Diet recommendation: renal diet  Filed Weights   05/18/14 0805 05/18/14 1145 05/18/14 2100  Weight: 66.5 kg (146 lb 9.7 oz) 64.2 kg (141 lb 8.6 oz) 65.1 kg (143 lb 8.3 oz)    History of present illness:  73 yo AM PMHc ESRD on HD, CAD, DM [facility] dialysis. In ED found to be hyperkalemic at 6.9. Was given Ca++, insulin, and bicarb. Also noted to be in junctional rhythm and has been placed on external pacer. Renal following planning to diurese tonight. PCCM asked to see for admission. Of note has refused dialysis several times recently saying he just wants to die. Psychiatry consulted and deemed he does not have capacity to make medical decisions. Called his son twice and left message to talk to him and also called his daughter. Plan to discuss with family regarding HD and the need for the family to be present at the time HD,so he doesn t sign off early. ON 7/10 the son got in touch with us and agreed to talk to him about HD  and will be there to persuade him to complete the HD if he refuses.   Hospital Course:  ESRD T/TH/Sat  - [facility]. His noncompliance has been a recurring issue. He signs off after 2 hours of dialysis on Tuesday and did not go on Thursday; ended being admitted to the hospital with hyperkalemia and fluid overload.  - he received HD and his electrolyte abnormalities resolved.  Life threatening hyperkalemia  - 3rd admission in 2 months for refusing diaysis, then being transported here for emergent dialysis. His primary neph's in [place] need to be involved in the discussion of best plan of action for this man when he refuses treatment.  -Psychiatry recommended changing zoloft to welbutrin  -competency evaluation with assistance from interpreter done and he does not have capacity.  - discussed with family.  Anemia  -epo/iron as per renal discretion  -Hgb stable  DM  -Patient's CBG on the low side, with some hypoglycemic episodes  -his hgba1c is 7.1. Will stop lantus because of hypoglycemia episodes and continue with SSI.  CBG (last 3)   Recent Labs   05/17/14 1154  05/17/14 1657  05/17/14 2214   GLUCAP  235*  193*  175*    Encephalopathy  -pretty much clear after HD  -most likely related to uremia  Noncompliance with medical care  -See above discussion    Procedures:  HD  Consultations:  Nephrology  psychiatry  Discharge Exam: Filed Vitals:   05/19/14 0922  BP: 113/55  Pulse: 84  Temp: 97.4 F (36.3 C)  Resp: 18    General: alert afebrile comfortable Cardiovascular: s1s2 Respiratory: ctab  Discharge Instructions You were cared for by a hospitalist during your hospital stay. If you have any questions about your discharge medications or the care you received while you were in the hospital after you are discharged, you can call the unit and asked to speak with the hospitalist on call if the hospitalist that took care of you is not  available. Once you are discharged, your primary care physician will handle any further medical issues. Please note that NO REFILLS for any discharge medications will be authorized once you are discharged, as it is imperative that you return to your primary care physician (or establish a relationship with a primary care physician if you do not have one) for your aftercare needs so that they can reassess your need for medications and monitor your lab values.  Discharge Instructions   Discharge instructions    Complete by:  As directed   FOLLOW UP WITH PCP in 2 weeks.  Follow up with renal physician as recommended.            Medication List    STOP taking these medications       carvedilol 25 MG tablet  Commonly known as:  COREG     guaifenesin 100 MG/5ML syrup  Commonly known as:  ROBITUSSIN     insulin glargine 100 UNIT/ML injection  Commonly known as:  LANTUS     sertraline 100 MG tablet  Commonly known as:  ZOLOFT      TAKE these medications       AMBULATORY NON FORMULARY MEDICATION  - Lorazepam 1mg /ml Gel  - Sig: Apply 12ml topically twice daily as needed for anxiety     amLODipine 10 MG tablet  Commonly known as:  NORVASC  Take 1 tablet (10 mg total) by mouth daily.     aspirin 81 MG tablet  Take 81 mg by mouth daily.     atorvastatin 80 MG tablet  Commonly known as:  LIPITOR  Take 80 mg by mouth at bedtime. For hypercholesterolemia     buPROPion 150 MG 24 hr tablet  Commonly known as:  WELLBUTRIN XL  Take 1 tablet (150 mg total) by mouth daily.     clopidogrel 75 MG tablet  Commonly known as:  PLAVIX  Take 75 mg by mouth daily.     feeding supplement (NEPRO CARB STEADY) Liqd  Take 237 mLs by mouth as needed (missed meal during dialysis.).     feeding supplement (RESOURCE BREEZE) Liqd  Take 1 Container by mouth 2 (two) times daily between meals.     feeding supplement (PRO-STAT SUGAR FREE 64) Liqd  Take 30 mLs by mouth 2 (two) times daily. 10am and  10pm     ferrous sulfate 325 (65 FE) MG tablet  Take 325 mg by mouth 3 (three) times daily with meals. 10am, 2pm, 10pm     hydrALAZINE 50 MG tablet  Commonly known as:  APRESOLINE  Take 1 tablet (50 mg total) by mouth 3 (three) times daily. For HTN (10am, 2pm, 10pm)     hydrOXYzine 25 MG tablet  Commonly known as:  ATARAX/VISTARIL  Take 25 mg by mouth every 4 (four) hours as needed for anxiety.     insulin aspart 100 UNIT/ML  injection  Commonly known as:  novoLOG  - CBG 70 - 120: 0 units  - CBG 121 - 150: 1 unit  - CBG 151 - 200: 2 units  - CBG 201 - 250: 3 units  - CBG 251 - 300: 5 units  - CBG 301 - 350: 7 units  - CBG 351 - 400: 9 units     isosorbide mononitrate 60 MG 24 hr tablet  Commonly known as:  IMDUR  Take 60 mg by mouth daily. For HTN     mirtazapine 15 MG tablet  Commonly known as:  REMERON  Take 15 mg by mouth at bedtime.     nitroGLYCERIN 0.4 MG SL tablet  Commonly known as:  NITROSTAT  Place 0.4 mg under the tongue every 5 (five) minutes as needed for chest pain (May repeat for 3 doses as needed for chest pain. If chest pain persist call the MD.).     ondansetron 4 MG tablet  Commonly known as:  ZOFRAN  Take 4 mg by mouth every 8 (eight) hours as needed for nausea or vomiting.     oxyCODONE-acetaminophen 5-325 MG per tablet  Commonly known as:  PERCOCET/ROXICET  Take one tablet by mouth every 6 hours as needed for severe pain     pantoprazole 40 MG tablet  Commonly known as:  PROTONIX  Take 40 mg by mouth 2 (two) times daily. For esophageal reflux (6:30am & 4:30pm)     PHOSLO 667 MG capsule  Generic drug:  calcium acetate  Take 667-2,001 mg by mouth 4 (four) times daily - after meals and at bedtime. Take 3 capsules (2001 mg) 3 times daily with meals, take 1 capsule (667 mg) every night at bedtime with snack     PRESCRIPTION MEDICATION  Take 1 packet by mouth daily. Novosource (supplement)     SUPER B COMPLEX Tabs  Take 1 tablet by mouth  daily.       Allergies  Allergen Reactions  . Lisinopril Other (See Comments)    Per MAR       Follow-up Information   Follow up with [facility], [place], MD. Schedule an appointment as soon as possible for a visit in 1 week.   Specialty:  Internal Medicine   Contact information:   [address] [place] [address] [phone]        The results of significant diagnostics from this hospitalization (including imaging, microbiology, ancillary and laboratory) are listed below for reference.    Significant Diagnostic Studies: Dg Chest Port 1 View  05/12/2014   CLINICAL DATA:  Weakness.  EXAM: PORTABLE CHEST - 1 VIEW  COMPARISON:  04/05/2014  FINDINGS: Moderate cardiac enlargement. No pleural effusion or edema. No airspace consolidation.  IMPRESSION: 1. Cardiac enlargement.   Electronically Signed   By: Signa Kell M.D.   On: 05/12/2014 00:48    Microbiology: Recent Results (from the past 240 hour(s))  MRSA PCR SCREENING     Status: None   Collection Time    05/12/14  3:54 AM      Result Value Ref Range Status   MRSA by PCR NEGATIVE  NEGATIVE Final   Comment:            The GeneXpert MRSA Assay (FDA     approved for NASAL specimens     only), is one component of a     comprehensive MRSA colonization     surveillance program. It is not     intended to diagnose  MRSA     infection nor to guide or     monitor treatment for     MRSA infections.     Labs: Basic Metabolic Panel:  Recent Labs Lab 05/13/14 0258 05/16/14 1326 05/17/14 1450 05/18/14 0812 05/18/14 2012  NA 139 133* 130* 132* 134*  K 4.8 5.7* 5.6* 6.9* 4.9  CL 95* 92* 89* 93* 94*  CO2 21 22 24 22 27   GLUCOSE 59* 149* 233* 172* 148*  BUN 52* 86* 61* 75* 34*  CREATININE 6.99* 9.10* 7.30* 8.54* 5.05*  CALCIUM 8.4 7.9* 8.4 8.3* 8.5  PHOS  --  8.7*  --  6.8*  --    Liver Function Tests:  Recent Labs Lab 05/16/14 1326 05/18/14 0812  ALBUMIN 2.9* 2.9*   No results found for this basename:  LIPASE, AMYLASE,  in the last 168 hours No results found for this basename: AMMONIA,  in the last 168 hours CBC:  Recent Labs Lab 05/13/14 0258 05/16/14 1326 05/18/14 0812  WBC 6.3 6.8 10.3  NEUTROABS 5.3  --   --   HGB 12.4* 11.4* 10.9*  HCT 38.2* 34.5* 32.7*  MCV 99.7 96.9 97.6  PLT 235 236 216   Cardiac Enzymes: No results found for this basename: CKTOTAL, CKMB, CKMBINDEX, TROPONINI,  in the last 168 hours BNP: BNP (last 3 results) No results found for this basename: PROBNP,  in the last 8760 hours CBG:  Recent Labs Lab 05/17/14 2214 05/18/14 1227 05/18/14 1734 05/18/14 2127 05/19/14 0755  GLUCAP 175* 155* 277* 120* 134*       Signed:  Lindsey Demonte  [facility] 05/19/2014, 11:19 AM

## 2014-05-19 NOTE — Progress Notes (Signed)
Pt refused bloodwork this morning; rescheduling labs for later in the morning when family may be present and assist w/ compliance... Marvia Pickles, RN

## 2014-05-19 NOTE — Progress Notes (Signed)
Paged Dr. Blake Divine to advise son could not make meeting this morning but son spoke with case management and is agreeable to encouraging patient to go to dialysis on his dialysis days.

## 2014-05-21 NOTE — Progress Notes (Signed)
Clinical Social Worker facilitated patient discharge including contacting patient familyv(messages left for patient's son Orvilla Fus and daughter-in-law Danice Goltz)  and facility to confirm patient discharge plans.  Clinical information faxed to facility and patient isagreeable with plan. CSW arranged ambulance transport via [facility] to [facility]. RN to call report prior to discharge. CSW has not received call back from son or daughter-in-law. Current plan set up by [facility] and Psych SW is that patient's son will be available to go with patient to dialysis treatments to encourage him to remain for full treatments.   Clinical Social Worker will sign off for now as social work intervention is no longer needed. Please consult [place] again if new need arises.   Lovette Cliche, LCSW  [phone]

## 2014-05-22 ENCOUNTER — Other Ambulatory Visit: Payer: Self-pay | Admitting: *Deleted

## 2014-05-22 ENCOUNTER — Non-Acute Institutional Stay (SKILLED_NURSING_FACILITY): Payer: Medicare Other | Admitting: Internal Medicine

## 2014-05-22 DIAGNOSIS — I509 Heart failure, unspecified: Secondary | ICD-10-CM

## 2014-05-22 DIAGNOSIS — E1129 Type 2 diabetes mellitus with other diabetic kidney complication: Secondary | ICD-10-CM

## 2014-05-22 DIAGNOSIS — I110 Hypertensive heart disease with heart failure: Secondary | ICD-10-CM

## 2014-05-22 DIAGNOSIS — N186 End stage renal disease: Secondary | ICD-10-CM

## 2014-05-22 DIAGNOSIS — I255 Ischemic cardiomyopathy: Secondary | ICD-10-CM

## 2014-05-22 DIAGNOSIS — I2589 Other forms of chronic ischemic heart disease: Secondary | ICD-10-CM

## 2014-05-22 MED ORDER — OXYCODONE-ACETAMINOPHEN 5-325 MG PO TABS: ORAL_TABLET | ORAL | Status: AC

## 2014-05-22 MED ORDER — AMBULATORY NON FORMULARY MEDICATION: Status: AC

## 2014-05-22 NOTE — Progress Notes (Signed)
HISTORY & PHYSICAL  DATE: 05/22/2014   FACILITY: [facility]  LEVEL OF CARE: SNF (31)  ALLERGIES:  Allergies  Allergen Reactions  . Lisinopril Other (See Comments)    Per MAR    CHIEF COMPLAINT:  Manage cardiomyopathy, diabetes mellitus and hypertension  HISTORY OF PRESENT ILLNESS: 73 year old vietnamese male was hospitalized secondary to life-threatening hyperkalemia. After hospitalization he is  readmitted back to the facility for long-term care management. Patient overall is a poor historian.  CARDIOMYOPATHY: The patient's cardiomyopathy remains stable. Patient denies increasing lower extremity swelling, shortness of breath, chest pain, palpitations, orthopnea or PNDs. No complications reported from the medications currently being used.  DM:pt's DM remains stable.  Pt denies polyuria, polydipsia, polyphagia, changes in vision or hypoglycemic episodes.  No complications noted from the medication presently being used.  Last hemoglobin A1c is: 7.1.  HTN: Pt 's HTN remains stable.  Denies CP, sob, DOE, pedal edema, headaches, dizziness or visual disturbances.  No complications from the medications currently being used.  Last BP : 120/64.  PAST MEDICAL HISTORY :  Past Medical History  Diagnosis Date  . 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 Hypertension   . CAD (coronary artery disease)      with angioplasty  . 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 Cardiomyopathy     with ejection fraction of 45 %  . Heart failure, systolic and diastolic   . Anemia   . CHF (congestive heart failure)     PAST SURGICAL HISTORY: Past Surgical History  Procedure Laterality Date  . Spine surgery      spinal fusion  . Av fistula placement Left 01/16/2014    Procedure: ARTERIOVENOUS (AV) FISTULA CREATION; ULTRASOUND GUIDED;  Surgeon: Larina Earthly, MD;  Location: [facility] OR;  Service: Vascular;  Laterality: Left;    SOCIAL HISTORY:  reports  that he has never smoked. He has never used smokeless tobacco. He reports that he does not drink alcohol or use illicit drugs.  FAMILY HISTORY: None  CURRENT MEDICATIONS: Reviewed per MAR/see medication list  REVIEW OF SYSTEMS: Difficult to obtain-patient is a poor historian  PHYSICAL EXAMINATION  VS:  See VS section  GENERAL: no acute distress, normal body habitus EYES: conjunctivae normal, sclerae normal, normal eye lids MOUTH/THROAT: lips without lesions,no lesions in the mouth,tongue is without lesions,uvula elevates in midline NECK: supple, trachea midline, no neck masses, no thyroid tenderness, no thyromegaly LYMPHATICS: no LAN in the neck, no supraclavicular LAN RESPIRATORY: breathing is even & unlabored, BS CTAB CARDIAC: RRR, no murmur,no extra heart sounds, no edema GI:  ABDOMEN: abdomen soft, normal BS, no masses, no tenderness  LIVER/SPLEEN: no hepatomegaly, no splenomegaly MUSCULOSKELETAL: HEAD: normal to inspection  EXTREMITIES: LEFT UPPER EXTREMITY: full range of motion, normal strength & tone RIGHT UPPER EXTREMITY:  full range of motion, normal strength & tone LEFT LOWER EXTREMITY:  full range of motion, normal strength & tone RIGHT LOWER EXTREMITY:  full range of motion, normal strength & tone PSYCHIATRIC: the patient is alert & oriented to person, affect & behavior appropriate  LABS/RADIOLOGY:  Labs reviewed: Basic Metabolic Panel:  Recent Labs  16/10/96 0845  04/05/14 0344  05/12/14 0022  05/16/14 1326  05/18/14 0812 05/18/14 2012 05/19/14 1411  NA 136*  < > 137  < >  --   < > 133*  < > 132* 134* 132*  K 4.6  < > 7.5*  < >  --   < > 5.7*  < > 6.9* 4.9 5.0  CL 97  < > 95*  < >  --   < > 92*  < > 93* 94* 91*  CO2 25  < > 15*  < >  --   < > 22  < > 22 27 24   GLUCOSE 128*  < > 256*  < >  --   < > 149*  < > 172* 148* 286*  BUN 47*  < > 157*  < >  --   < > 86*  < > 75* 34* 49*  CREATININE 6.97*  < > 12.94*  < >  --   < > 9.10*  < > 8.54* 5.05* 6.61*    CALCIUM 7.7*  < > 10.3  < >  --   < > 7.9*  < > 8.3* 8.5 8.3*  MG  --   --  4.4*  --  2.8*  --   --   --   --   --   --   PHOS 5.8*  --  15.5*  < > 12.2*  --  8.7*  --  6.8*  --   --   < > = values in this interval not displayed. Liver Function Tests:  Recent Labs  04/05/14 0206 04/06/14 0415  05/11/14 2348 05/16/14 1326 05/18/14 0812  AST 452* 145*  --  200*  --   --   ALT 458* 289*  --  101*  --   --   ALKPHOS 257* 198*  --  194*  --   --   BILITOT 0.7 0.4  --  0.5  --   --   PROT 6.8 5.9*  --  6.6  --   --   ALBUMIN 3.1* 2.5*  < > 2.9* 2.9* 2.9*  < > = values in this interval not displayed.  Recent Labs  03/15/14 0100 04/05/14 0206  AMMONIA 18 33   CBC:  Recent Labs  04/05/14 0206  05/11/14 2348  05/13/14 0258 05/16/14 1326 05/18/14 0812  WBC 3.7*  < > 6.5  --  6.3 6.8 10.3  NEUTROABS 3.4  --  5.6  --  5.3  --   --   HGB 8.9*  < > 9.9*  < > 12.4* 11.4* 10.9*  HCT 28.2*  < > 30.2*  < > 38.2* 34.5* 32.7*  MCV 103.3*  < > 98.4  --  99.7 96.9 97.6  PLT 178  < > 137*  --  235 236 216  < > = values in this interval not displayed.  Cardiac Enzymes:  Recent Labs  04/05/14 0424 04/05/14 0931 04/05/14 1500  TROPONINI <0.30 <0.30 <0.30   CBG:  Recent Labs  05/19/14 0755 05/19/14 1112 05/19/14 1606  GLUCAP 134* 300* 235*    PORTABLE CHEST - 1 VIEW   COMPARISON:  04/05/2014   FINDINGS: Moderate cardiac enlargement. No pleural effusion or edema. No airspace consolidation.   IMPRESSION: 1. Cardiac enlargement.   ASSESSMENT/PLAN:  Ischemic cardiomyopathy-compensated Diabetes mellitus with renal complications-adequately controlled Renovascular hypertension-well-controlled End-stage renal disease-on hemodialysis but patient is noncompliant. Had discussions with son but no decision was made. Anemia of chronic kidney disease-recheck hemoglobin. Continue epogen and iron Moderate malnutrition -- continue nutritional  supplements. CAD-stable GERD-continue PPI Depression -- continue Wellbutrin and Remeron. per psychiatry patient lacks decision-making capacity.  I have reviewed patient's medical records received at  admission/from hospitalization.  CPT CODE: 4098199306  Angela CoxGayani Y Dasanayaka, MD [facility] [phone]

## 2014-05-22 NOTE — Telephone Encounter (Signed)
[facility] 

## 2014-06-26 ENCOUNTER — Non-Acute Institutional Stay (SKILLED_NURSING_FACILITY): Payer: Medicare Other | Admitting: Internal Medicine

## 2014-06-26 DIAGNOSIS — D631 Anemia in chronic kidney disease: Secondary | ICD-10-CM

## 2014-06-26 DIAGNOSIS — N039 Chronic nephritic syndrome with unspecified morphologic changes: Principal | ICD-10-CM

## 2014-06-26 DIAGNOSIS — E785 Hyperlipidemia, unspecified: Secondary | ICD-10-CM

## 2014-06-26 DIAGNOSIS — E1129 Type 2 diabetes mellitus with other diabetic kidney complication: Secondary | ICD-10-CM

## 2014-06-26 DIAGNOSIS — I509 Heart failure, unspecified: Secondary | ICD-10-CM

## 2014-06-26 DIAGNOSIS — I110 Hypertensive heart disease with heart failure: Secondary | ICD-10-CM

## 2014-06-27 ENCOUNTER — Other Ambulatory Visit: Payer: Self-pay | Admitting: Nephrology

## 2014-06-27 LAB — POTASSIUM
Potassium: 5.3 mmol/L — ABNORMAL HIGH (ref 3.5–5.1)
Potassium: 3.5 mmol/L (ref 3.5–5.1)

## 2014-06-27 NOTE — Progress Notes (Signed)
         PROGRESS NOTE  DATE: 06-26-14  FACILITY: Nursing Home Location: [facility]  LEVEL OF CARE: SNF (31)  Routine Visit  CHIEF COMPLAINT:  Manage anemia of chronic kidney disease, diabetes mellitus, hypertension and hyperlipidemia  HISTORY OF PRESENT ILLNESS:  REASSESSMENT OF ONGOING PROBLEM(S):  ANEMIA: The anemia has been stable. The staff deny fatigue, melena or hematochezia. No complications from the medications currently being used. 06-23-14 hemoglobin 10.7, MCV 99.  DM:pt's DM remains stable.  Staff deny polyuria, polydipsia, polyphagia, changes in vision or hypoglycemic episodes.  No complications noted from the medication presently being used.  Last hemoglobin A1c is:6.7 in 2-15. Patient is a poor historian.  HTN: Pt 's HTN remains stable.  Staff Deny CP, sob, DOE, pedal edema, headaches, dizziness or visual disturbances.  No complications from the medications currently being used.  Last BP : 136/68, 130/59.  HYPERLIPIDEMIA: No complications from the medications presently being used. Last fasting lipid panel was normal in 2-15.  PAST MEDICAL HISTORY : Reviewed.  No changes/see problem list  CURRENT MEDICATIONS: Reviewed per MAR/see medication list  REVIEW OF SYSTEMS: Unobtainable due to patient being a poor historian  PHYSICAL EXAMINATION  VS:  See VS section  GENERAL: no acute distress, normal body habitus EYES: conjunctivae normal, sclerae normal, normal eye lids NECK: supple, trachea midline, no neck masses, no thyroid tenderness, no thyromegaly LYMPHATICS: no LAN in the neck, no supraclavicular LAN RESPIRATORY: breathing is even & unlabored, BS CTAB CARDIAC: RRR, no murmur,no extra heart sounds, no edema GI: abdomen soft, normal BS, no masses, no tenderness, no hepatomegaly, no splenomegaly PSYCHIATRIC: the patient is alert & oriented to person, affect & behavior appropriate  LABS/RADIOLOGY: 8-15 hemoglobin 10.7, MCV 99 otherwise CBC  normal, glucose 174, BUN 65, creatinine 7.23, alkaline phosphatase 128 otherwise CMP normal 5-15 hemoglobin 10.3, MCV 97 otherwise CBC normal, BUN 70, creatinine 8.54, total protein 5.9, albumin 3.3, alkaline phosphatase 172 otherwise CMP normal   ASSESSMENT/PLAN:  Diabetes mellitus with renal complications-well controlled. Check hemoglobin A1c. Renovascular hypertension-stable Hyperlipidemia-well controlled. Check fasting lipid panel. Anemia of chronic kidney disease-continue iron. Hemoglobin stable. CAD-stable End-stage renal disease-on hemodialysis GERD-continue PPI Hyperphosphatemia-continue PhosLo Depression-continue Zoloft  CPT CODE: 46286  Angela Cox, MD [facility] [phone]

## 2014-06-30 ENCOUNTER — Non-Acute Institutional Stay (SKILLED_NURSING_FACILITY): Payer: Medicare Other | Admitting: Internal Medicine

## 2014-06-30 DIAGNOSIS — F32A Depression, unspecified: Secondary | ICD-10-CM

## 2014-06-30 DIAGNOSIS — N185 Chronic kidney disease, stage 5: Secondary | ICD-10-CM

## 2014-06-30 DIAGNOSIS — F3289 Other specified depressive episodes: Secondary | ICD-10-CM

## 2014-06-30 DIAGNOSIS — E1129 Type 2 diabetes mellitus with other diabetic kidney complication: Secondary | ICD-10-CM

## 2014-06-30 DIAGNOSIS — F329 Major depressive disorder, single episode, unspecified: Secondary | ICD-10-CM

## 2014-07-03 ENCOUNTER — Non-Acute Institutional Stay (SKILLED_NURSING_FACILITY): Payer: Medicare Other | Admitting: Internal Medicine

## 2014-07-03 DIAGNOSIS — E1165 Type 2 diabetes mellitus with hyperglycemia: Principal | ICD-10-CM

## 2014-07-03 DIAGNOSIS — E1129 Type 2 diabetes mellitus with other diabetic kidney complication: Secondary | ICD-10-CM

## 2014-07-04 NOTE — Progress Notes (Addendum)
Patient ID: Albert Grant, male   DOB: [birth year], 72 y.o.   MRN: [id number]               PROGRESS NOTE  DATE:  06/30/2014    FACILITY: [facility]    LEVEL OF CARE:   SNF   Acute Visit   CHIEF COMPLAINT:  Review of medication refusals.    HISTORY OF PRESENT ILLNESS:  This is a patient who has type 2 diabetes, on insulin; also with chronic renal failure, on longstanding dialysis Tuesday, Thursday, and Saturday.    There have been issues with medical noncompliance with this man in the past including multiple refusals of dialysis resulting in a relatively frequent degree of hospitalizations for hyperkalemia, fluid overload requiring urgent dialysis.  As far as I am aware, they have not had recent issues in this regard.             However, it has been brought to my attention that he has basically taken none of his medications all month.  This includes his antihypertension medications, Norvasc and Apresoline.   He is also on both aspirin and Plavix.  He has taken none of these.  He is on Imdur 60 mg daily.  He has not taken this.  He is on Wellbutrin XL 150 daily.  He has not taken this at all this month.    I have discussed this with the patient and watched the medical assistant go in and try to give him his medications this morning.  With considerable coaxing, mostly on my part, I was able to get him to take his medicines.  However, I certainly sympathize with the staff.  This would not have happened without my presence, I am quite convinced.     With regards to his diabetes, he is on a sliding scale insulin only.  He is not on any long-acting insulin.  At one point, he was on Lantus.  This got stopped.  I am not really sure why.      The patient was recently in hospital, 05/11/2014 through 05/19/2014, at which time he was admitted to hospital after  apparently refusing hemodialysis and/or signing off early from dialysis treatment.  At that point, he was found to be hyperkalemic with a  potassium of 6.9.  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  The patient stated  apparently to the hospital physicians that he "just wanted to die".  Psychiatry was consulted and he was deemed not to have the capacity to make medical decisions.    Per discussion with the staff today, he is  apparently going to dialysis but is taking none of his medications as listed above.    REVIEW OF SYSTEMS:  Not really possible from the patient.    PHYSICAL EXAMINATION:   GENERAL APPEARANCE:  The patient is despondent.  As mentioned above, he did not easily take his medications in spite of what I considered to be a fairly good effort from the medical assistant who was attempting to give them to him.   CHEST/RESPIRATORY:  Shallow, but otherwise clear air entry.   CARDIOVASCULAR:  CARDIAC:   Heart sounds are normal.  He does not appear to be dehydrated.   GASTROINTESTINAL:  ABDOMEN:  Soft.  Bowel sounds are positive.   LIVER/SPLEEN/KIDNEYS:  No liver, no spleen.   PSYCHIATRIC:   MENTAL STATUS:   I think this man is depressed.  It is difficult to converse with him  due to the language barrier.  I suspect this man is cognitively intact.  It is difficult to assess his capacity due to language barrier, but I think he is well aware of what he is doing.    ASSESSMENT/PLAN:  Medical noncompliance.  I attempted to talk to the patient today.  I think what he is doing makes little medical sense.  He is  apparently now being compliant with his dialysis, but completely noncompliant with his medications.  When the medical assistant brought the medications in, the one thing he did say was "too much".   I might be able to simplify this, maybe ensure compliance with some of the more important medications.    Major depression.  He is not taking the Wellbutrin XL and there is not a very good list of things we could try here.  Maybe liquid Prozac.    Hypertension.  In spite of the fact that he is  not taking any of his antihypertensive medications, his blood pressure is generally in the low 100s systolic.  The maximum I have seen is 165.  I may be able to actually eliminate some of this.    Type 2 diabetes.  On insulin, only a sliding scale.  His blood sugars are fairly consistently elevated.  His Lantus insulin was stopped in the hospital.  Again, the rationale here is not completely clear.  His sliding scale was recently made more aggressive.  He seems to be compliant with this.    His power of attorney is actually his children although, as mentioned above, I think this man is probably cognitively intact.  I will try to simplify his medical regimen.  Recheck in one wee

## 2014-07-05 NOTE — Progress Notes (Signed)
Patient ID: Albert Grant, male   DOB: [birth year], 73 y.o.   MRN: [id number]           PROGRESS NOTE  DATE: 07/03/2014        FACILITY:  [facility]  LEVEL OF CARE: SNF (31)  Acute Visit  CHIEF COMPLAINT:  Manage diabetes mellitus.    HISTORY OF PRESENT ILLNESS: I was requested by the staff to assess the patient regarding above problem(s):  DM:pt's DM is unstable.  Staff report that patient's blood sugars are elevated, mostly in the 400s.  Patient is a poor historian.   Staff denies polyuria, polydipsia, polyphagia, changes in vision or hypoglycemic episodes.  No complications noted from the medication presently being used.  Last hemoglobin A1c is:  6.7 in 12/2013.    PAST MEDICAL HISTORY : Reviewed.  No changes/see problem list  CURRENT MEDICATIONS: Reviewed per MAR/see medication list  REVIEW OF SYSTEMS:  Unobtainable.  Patient is a poor historian.      PHYSICAL EXAMINATION  VS: see VS section  GENERAL: no acute distress, normal body habitus NECK: supple, trachea midline, no neck masses, no thyroid tenderness, no thyromegaly RESPIRATORY: breathing is even & unlabored, BS CTAB CARDIAC: RRR, no murmur,no extra heart sounds, no edema GI: abdomen soft, normal BS, no masses, no tenderness, no hepatomegaly, no splenomegaly PSYCHIATRIC: the patient is alert & oriented to person, affect & behavior appropriate  ASSESSMENT/PLAN:  Diabetes mellitus with renal complications.  Uncontrolled.  Start Lantus insulin 12 U q.h.s.  Check hemoglobin A1c.      CPT CODE: 56153          Angela Cox, MD [facility] [phone]

## 2014-07-06 DIAGNOSIS — E1165 Type 2 diabetes mellitus with hyperglycemia: Principal | ICD-10-CM

## 2014-07-06 DIAGNOSIS — E1129 Type 2 diabetes mellitus with other diabetic kidney complication: Secondary | ICD-10-CM | POA: Insufficient documentation

## 2014-07-31 ENCOUNTER — Non-Acute Institutional Stay (SKILLED_NURSING_FACILITY): Payer: Medicare Other | Admitting: Internal Medicine

## 2014-07-31 DIAGNOSIS — N039 Chronic nephritic syndrome with unspecified morphologic changes: Secondary | ICD-10-CM

## 2014-07-31 DIAGNOSIS — E785 Hyperlipidemia, unspecified: Secondary | ICD-10-CM

## 2014-07-31 DIAGNOSIS — E1129 Type 2 diabetes mellitus with other diabetic kidney complication: Secondary | ICD-10-CM

## 2014-07-31 DIAGNOSIS — I2510 Atherosclerotic heart disease of native coronary artery without angina pectoris: Secondary | ICD-10-CM

## 2014-07-31 DIAGNOSIS — D631 Anemia in chronic kidney disease: Secondary | ICD-10-CM

## 2014-08-01 NOTE — Progress Notes (Signed)
         PROGRESS NOTE  DATE: 06-26-14  FACILITY: Nursing Home Location: [facility]  LEVEL OF CARE: SNF (31)  Routine Visit  CHIEF COMPLAINT:  Manage anemia of chronic kidney disease, diabetes mellitus, and hyperlipidemia  HISTORY OF PRESENT ILLNESS:  REASSESSMENT OF ONGOING PROBLEM(S):  ANEMIA: The anemia has been stable. The staff deny fatigue, melena or hematochezia. No complications from the medications currently being used. 06-23-14 hemoglobin 10.7, MCV 99, in 8-15 hemoglobin 10.7, MCV 99  DM:pt's DM remains stable.  Staff deny polyuria, polydipsia, polyphagia, changes in vision or hypoglycemic episodes.  No complications noted from the medication presently being used.  Last hemoglobin A1c is:6.7 in 2-15, in 8-15 hemoglobin A1c 8.2. Patient is a poor historian.  HYPERLIPIDEMIA: No complications from the medications presently being used. Last fasting lipid panel was normal in 2-15. In 8-15 FLP normal.  PAST MEDICAL HISTORY : Reviewed.  No changes/see problem list  CURRENT MEDICATIONS: Reviewed per MAR/see medication list  REVIEW OF SYSTEMS: Unobtainable due to patient being a poor historian  PHYSICAL EXAMINATION  VS:  See VS section  GENERAL: no acute distress, normal body habitus NECK: supple, trachea midline, no neck masses, no thyroid tenderness, no thyromegaly RESPIRATORY: breathing is even & unlabored, BS CTAB CARDIAC: RRR, no murmur,no extra heart sounds, no edema GI: abdomen soft, normal BS, no masses, no tenderness, no hepatomegaly, no splenomegaly PSYCHIATRIC: the patient is alert & oriented to person, affect & behavior appropriate  LABS/RADIOLOGY: 8-15 hemoglobin 10.7, MCV 99 otherwise CBC normal, glucose 174, BUN 65, creatinine 7.23, alkaline phosphatase 128 otherwise CMP normal 5-15 hemoglobin 10.3, MCV 97 otherwise CBC normal, BUN 70, creatinine 8.54, total protein 5.9, albumin 3.3, alkaline phosphatase 172 otherwise CMP normal    ASSESSMENT/PLAN:  Diabetes mellitus with renal complications-well controlled.  Renovascular hypertension-medications were discontinued. Blood pressure normal. Hyperlipidemia-well controlled.  Anemia of chronic kidney disease-continue iron. Hemoglobin stable. 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  CPT CODE: 79480  Angela Cox, MD [facility] [phone]

## 2014-08-30 ENCOUNTER — Non-Acute Institutional Stay (SKILLED_NURSING_FACILITY): Payer: Medicare Other | Admitting: Internal Medicine

## 2014-08-30 DIAGNOSIS — D631 Anemia in chronic kidney disease: Secondary | ICD-10-CM

## 2014-08-30 DIAGNOSIS — I110 Hypertensive heart disease with heart failure: Secondary | ICD-10-CM

## 2014-08-30 DIAGNOSIS — E1122 Type 2 diabetes mellitus with diabetic chronic kidney disease: Secondary | ICD-10-CM

## 2014-08-30 DIAGNOSIS — E785 Hyperlipidemia, unspecified: Secondary | ICD-10-CM

## 2014-08-30 DIAGNOSIS — N189 Chronic kidney disease, unspecified: Secondary | ICD-10-CM

## 2014-08-30 DIAGNOSIS — I509 Heart failure, unspecified: Secondary | ICD-10-CM

## 2014-09-02 NOTE — Progress Notes (Signed)
         PROGRESS NOTE  DATE: 08-30-14  FACILITY: Nursing Home Location: [facility]  LEVEL OF CARE: SNF (31)  Routine Visit  CHIEF COMPLAINT:  Manage anemia of chronic kidney disease, diabetes mellitus, and hyperlipidemia  HISTORY OF PRESENT ILLNESS:  REASSESSMENT OF ONGOING PROBLEM(S):  ANEMIA: The anemia has been stable. The staff deny fatigue, melena or hematochezia. No complications from the medications currently being used. 06-23-14 hemoglobin 10.7, MCV 99, in 8-15 hemoglobin 10.7, MCV 99  DM:pt's DM remains stable.  Staff deny polyuria, polydipsia, polyphagia, changes in vision or hypoglycemic episodes.  No complications noted from the medication presently being used.  Last hemoglobin A1c is:6.7 in 2-15, in 8-15 hemoglobin A1c 8.2. Patient is a poor historian.  HYPERLIPIDEMIA: No complications from the medications presently being used. Last fasting lipid panel was normal in 2-15. In 8-15 FLP normal.  PAST MEDICAL HISTORY : Reviewed.  No changes/see problem list  CURRENT MEDICATIONS: Reviewed per MAR/see medication list  REVIEW OF SYSTEMS: Unobtainable due to patient being a poor historian  PHYSICAL EXAMINATION  VS:  See VS section  GENERAL: no acute distress, normal body habitus NECK: supple, trachea midline, no neck masses, no thyroid tenderness, no thyromegaly RESPIRATORY: breathing is even & unlabored, BS CTAB CARDIAC: RRR, no murmur,no extra heart sounds, no edema GI: abdomen soft, normal BS, no masses, no tenderness, no hepatomegaly, no splenomegaly PSYCHIATRIC: the patient is alert & oriented to person, affect & behavior appropriate  LABS/RADIOLOGY: 8-15 hemoglobin 10.7, MCV 99 otherwise CBC normal, glucose 174, BUN 65, creatinine 7.23, alkaline phosphatase 128 otherwise CMP normal 5-15 hemoglobin 10.3, MCV 97 otherwise CBC normal, BUN 70, creatinine 8.54, total protein 5.9, albumin 3.3, alkaline phosphatase 172 otherwise CMP normal    ASSESSMENT/PLAN:  Diabetes mellitus with renal complications-not well controlled due to noncompliance. Renovascular hypertension-BP elevated off meds.  Will review a log. Hyperlipidemia-well controlled.  Anemia of chronic kidney disease-continue iron. Hemoglobin stable. 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  Abilify was started.  CPT CODE: 35573  Angela Cox, MD [facility] [phone]

## 2014-09-16 ENCOUNTER — Inpatient Hospital Stay: Payer: Self-pay | Admitting: Internal Medicine

## 2014-09-16 LAB — COMPREHENSIVE METABOLIC PANEL
ALBUMIN: 2.7 g/dL — AB (ref 3.4–5.0)
Alkaline Phosphatase: 132 U/L — ABNORMAL HIGH
Anion Gap: 13 (ref 7–16)
BUN: 63 mg/dL — AB (ref 7–18)
Bilirubin,Total: 0.4 mg/dL (ref 0.2–1.0)
CREATININE: 8.72 mg/dL — AB (ref 0.60–1.30)
Calcium, Total: 7.4 mg/dL — ABNORMAL LOW (ref 8.5–10.1)
Chloride: 93 mmol/L — ABNORMAL LOW (ref 98–107)
Co2: 27 mmol/L (ref 21–32)
EGFR (Non-African Amer.): 6 — ABNORMAL LOW
GFR CALC AF AMER: 8 — AB
Glucose: 293 mg/dL — ABNORMAL HIGH (ref 65–99)
Osmolality: 295 (ref 275–301)
POTASSIUM: 3.7 mmol/L (ref 3.5–5.1)
SGOT(AST): 24 U/L (ref 15–37)
SGPT (ALT): 22 U/L
Sodium: 133 mmol/L — ABNORMAL LOW (ref 136–145)
Total Protein: 7.5 g/dL (ref 6.4–8.2)

## 2014-09-16 LAB — CBC
HCT: 38.1 % — ABNORMAL LOW (ref 40.0–52.0)
HGB: 12.3 g/dL — AB (ref 13.0–18.0)
MCH: 32.3 pg (ref 26.0–34.0)
MCHC: 32.3 g/dL (ref 32.0–36.0)
MCV: 100 fL (ref 80–100)
Platelet: 328 10*3/uL (ref 150–440)
RBC: 3.81 10*6/uL — ABNORMAL LOW (ref 4.40–5.90)
RDW: 15.5 % — ABNORMAL HIGH (ref 11.5–14.5)
WBC: 10.1 10*3/uL (ref 3.8–10.6)

## 2014-09-16 LAB — CK TOTAL AND CKMB (NOT AT ARMC)
CK, Total: 380 U/L — ABNORMAL HIGH
CK, Total: 304 U/L
CK, Total: 311 U/L — ABNORMAL HIGH
CK-MB: 5.3 ng/mL — ABNORMAL HIGH (ref 0.5–3.6)
CK-MB: 3.9 ng/mL — ABNORMAL HIGH (ref 0.5–3.6)
CK-MB: 4.5 ng/mL — AB (ref 0.5–3.6)

## 2014-09-16 LAB — TROPONIN I
Troponin-I: 0.17 ng/mL — ABNORMAL HIGH
Troponin-I: 0.16 ng/mL — ABNORMAL HIGH
Troponin-I: 0.18 ng/mL — ABNORMAL HIGH

## 2014-09-16 LAB — HEPARIN LEVEL (UNFRACTIONATED)

## 2014-09-16 LAB — APTT: ACTIVATED PTT: 32.8 s (ref 23.6–35.9)

## 2014-09-16 LAB — PROTIME-INR
INR: 1.1
Prothrombin Time: 13.8 secs (ref 11.5–14.7)

## 2014-09-17 LAB — CBC WITH DIFFERENTIAL/PLATELET
BASOS PCT: 0.5 %
Basophil #: 0 10*3/uL (ref 0.0–0.1)
EOS ABS: 0.3 10*3/uL (ref 0.0–0.7)
Eosinophil %: 3.7 %
HCT: 35.6 % — AB (ref 40.0–52.0)
HGB: 11.3 g/dL — ABNORMAL LOW (ref 13.0–18.0)
Lymphocyte #: 0.4 10*3/uL — ABNORMAL LOW (ref 1.0–3.6)
Lymphocyte %: 4.8 %
MCH: 31.9 pg (ref 26.0–34.0)
MCHC: 31.8 g/dL — ABNORMAL LOW (ref 32.0–36.0)
MCV: 100 fL (ref 80–100)
Monocyte #: 0.5 x10 3/mm (ref 0.2–1.0)
Monocyte %: 5.3 %
NEUTROS PCT: 85.7 %
Neutrophil #: 7.8 10*3/uL — ABNORMAL HIGH (ref 1.4–6.5)
PLATELETS: 287 10*3/uL (ref 150–440)
RBC: 3.55 10*6/uL — ABNORMAL LOW (ref 4.40–5.90)
RDW: 15.4 % — ABNORMAL HIGH (ref 11.5–14.5)
WBC: 9.1 10*3/uL (ref 3.8–10.6)

## 2014-09-17 LAB — HEPARIN LEVEL (UNFRACTIONATED): Anti-Xa(Unfractionated): 0.3 IU/mL (ref 0.30–0.70)

## 2014-12-07 ENCOUNTER — Ambulatory Visit: Payer: Self-pay | Admitting: Internal Medicine

## 2014-12-07 LAB — POTASSIUM: POTASSIUM: 5.7 mmol/L — AB (ref 3.5–5.1)

## 2014-12-10 ENCOUNTER — Inpatient Hospital Stay (HOSPITAL_COMMUNITY)
Admission: EM | Admit: 2014-12-10 | Discharge: 2015-01-09 | DRG: 682 | Disposition: E | Payer: Medicare Other | Attending: Internal Medicine | Admitting: Internal Medicine

## 2014-12-10 ENCOUNTER — Emergency Department (HOSPITAL_COMMUNITY): Payer: Medicare Other

## 2014-12-10 ENCOUNTER — Inpatient Hospital Stay (HOSPITAL_COMMUNITY): Payer: Medicare Other

## 2014-12-10 ENCOUNTER — Encounter (HOSPITAL_COMMUNITY): Payer: Self-pay | Admitting: *Deleted

## 2014-12-10 DIAGNOSIS — Z992 Dependence on renal dialysis: Secondary | ICD-10-CM | POA: Diagnosis not present

## 2014-12-10 DIAGNOSIS — Z981 Arthrodesis status: Secondary | ICD-10-CM

## 2014-12-10 DIAGNOSIS — Z8673 Personal history of transient ischemic attack (TIA), and cerebral infarction without residual deficits: Secondary | ICD-10-CM

## 2014-12-10 DIAGNOSIS — Z79899 Other long term (current) drug therapy: Secondary | ICD-10-CM | POA: Diagnosis not present

## 2014-12-10 DIAGNOSIS — Z7982 Long term (current) use of aspirin: Secondary | ICD-10-CM | POA: Diagnosis not present

## 2014-12-10 DIAGNOSIS — Z515 Encounter for palliative care: Secondary | ICD-10-CM

## 2014-12-10 DIAGNOSIS — R197 Diarrhea, unspecified: Secondary | ICD-10-CM | POA: Diagnosis present

## 2014-12-10 DIAGNOSIS — Z66 Do not resuscitate: Secondary | ICD-10-CM | POA: Diagnosis present

## 2014-12-10 DIAGNOSIS — Z9119 Patient's noncompliance with other medical treatment and regimen: Secondary | ICD-10-CM | POA: Diagnosis present

## 2014-12-10 DIAGNOSIS — R001 Bradycardia, unspecified: Secondary | ICD-10-CM | POA: Diagnosis present

## 2014-12-10 DIAGNOSIS — I5042 Chronic combined systolic (congestive) and diastolic (congestive) heart failure: Secondary | ICD-10-CM | POA: Diagnosis present

## 2014-12-10 DIAGNOSIS — Z79891 Long term (current) use of opiate analgesic: Secondary | ICD-10-CM

## 2014-12-10 DIAGNOSIS — G9341 Metabolic encephalopathy: Secondary | ICD-10-CM | POA: Diagnosis present

## 2014-12-10 DIAGNOSIS — N186 End stage renal disease: Secondary | ICD-10-CM | POA: Diagnosis present

## 2014-12-10 DIAGNOSIS — Z794 Long term (current) use of insulin: Secondary | ICD-10-CM | POA: Diagnosis not present

## 2014-12-10 DIAGNOSIS — F329 Major depressive disorder, single episode, unspecified: Secondary | ICD-10-CM | POA: Diagnosis present

## 2014-12-10 DIAGNOSIS — I120 Hypertensive chronic kidney disease with stage 5 chronic kidney disease or end stage renal disease: Secondary | ICD-10-CM | POA: Diagnosis present

## 2014-12-10 DIAGNOSIS — N179 Acute kidney failure, unspecified: Principal | ICD-10-CM | POA: Diagnosis present

## 2014-12-10 DIAGNOSIS — D696 Thrombocytopenia, unspecified: Secondary | ICD-10-CM | POA: Diagnosis present

## 2014-12-10 DIAGNOSIS — E1122 Type 2 diabetes mellitus with diabetic chronic kidney disease: Secondary | ICD-10-CM | POA: Diagnosis present

## 2014-12-10 DIAGNOSIS — E440 Moderate protein-calorie malnutrition: Secondary | ICD-10-CM | POA: Diagnosis present

## 2014-12-10 DIAGNOSIS — I2510 Atherosclerotic heart disease of native coronary artery without angina pectoris: Secondary | ICD-10-CM | POA: Diagnosis present

## 2014-12-10 DIAGNOSIS — D631 Anemia in chronic kidney disease: Secondary | ICD-10-CM | POA: Diagnosis present

## 2014-12-10 DIAGNOSIS — I255 Ischemic cardiomyopathy: Secondary | ICD-10-CM | POA: Diagnosis present

## 2014-12-10 DIAGNOSIS — J9602 Acute respiratory failure with hypercapnia: Secondary | ICD-10-CM | POA: Diagnosis present

## 2014-12-10 DIAGNOSIS — Z7902 Long term (current) use of antithrombotics/antiplatelets: Secondary | ICD-10-CM

## 2014-12-10 DIAGNOSIS — E874 Mixed disorder of acid-base balance: Secondary | ICD-10-CM | POA: Diagnosis present

## 2014-12-10 DIAGNOSIS — E875 Hyperkalemia: Secondary | ICD-10-CM | POA: Diagnosis present

## 2014-12-10 LAB — APTT: APTT: 37 s (ref 24–37)

## 2014-12-10 LAB — I-STAT ARTERIAL BLOOD GAS, ED
Acid-base deficit: 13 mmol/L — ABNORMAL HIGH (ref 0.0–2.0)
Acid-base deficit: 8 mmol/L — ABNORMAL HIGH (ref 0.0–2.0)
BICARBONATE: 15.5 meq/L — AB (ref 20.0–24.0)
Bicarbonate: 24.2 mEq/L — ABNORMAL HIGH (ref 20.0–24.0)
O2 SAT: 98 %
O2 SAT: 98 %
PH ART: 7.155 — AB (ref 7.350–7.450)
PO2 ART: 166 mmHg — AB (ref 80.0–100.0)
Patient temperature: 98.6
TCO2: 17 mmol/L (ref 0–100)
TCO2: 27 mmol/L (ref 0–100)
pCO2 arterial: 43.9 mmHg (ref 35.0–45.0)
pCO2 arterial: 92.2 mmHg (ref 35.0–45.0)
pH, Arterial: 7.027 — CL (ref 7.350–7.450)
pO2, Arterial: 142 mmHg — ABNORMAL HIGH (ref 80.0–100.0)

## 2014-12-10 LAB — DIFFERENTIAL
Basophils Absolute: 0 10*3/uL (ref 0.0–0.1)
Basophils Relative: 0 % (ref 0–1)
Eosinophils Absolute: 0 10*3/uL (ref 0.0–0.7)
Eosinophils Relative: 0 % (ref 0–5)
LYMPHS PCT: 2 % — AB (ref 12–46)
Lymphs Abs: 0.2 10*3/uL — ABNORMAL LOW (ref 0.7–4.0)
Monocytes Absolute: 0.7 10*3/uL (ref 0.1–1.0)
Monocytes Relative: 6 % (ref 3–12)
NEUTROS PCT: 92 % — AB (ref 43–77)
Neutro Abs: 10 10*3/uL — ABNORMAL HIGH (ref 1.7–7.7)

## 2014-12-10 LAB — PROTIME-INR
INR: 2.38 — ABNORMAL HIGH (ref 0.00–1.49)
Prothrombin Time: 26.2 seconds — ABNORMAL HIGH (ref 11.6–15.2)

## 2014-12-10 LAB — CBC
HEMATOCRIT: 35.6 % — AB (ref 39.0–52.0)
Hemoglobin: 11.5 g/dL — ABNORMAL LOW (ref 13.0–17.0)
MCH: 32.7 pg (ref 26.0–34.0)
MCHC: 32.3 g/dL (ref 30.0–36.0)
MCV: 101.1 fL — ABNORMAL HIGH (ref 78.0–100.0)
Platelets: 186 10*3/uL (ref 150–400)
RBC: 3.52 MIL/uL — ABNORMAL LOW (ref 4.22–5.81)
RDW: 14.6 % (ref 11.5–15.5)
WBC: 10.9 10*3/uL — AB (ref 4.0–10.5)

## 2014-12-10 LAB — I-STAT TROPONIN, ED: TROPONIN I, POC: 0.41 ng/mL — AB (ref 0.00–0.08)

## 2014-12-10 LAB — CBG MONITORING, ED: Glucose-Capillary: 176 mg/dL — ABNORMAL HIGH (ref 70–99)

## 2014-12-10 LAB — I-STAT CG4 LACTIC ACID, ED: Lactic Acid, Venous: 4.5 mmol/L (ref 0.5–2.0)

## 2014-12-10 MED ORDER — SUCCINYLCHOLINE CHLORIDE 20 MG/ML IJ SOLN
INTRAMUSCULAR | Status: AC
  Filled 2014-12-10: qty 1

## 2014-12-10 MED ORDER — ETOMIDATE 2 MG/ML IV SOLN
0.3000 mg/kg | Freq: Once | INTRAVENOUS | Status: AC
  Administered 2014-12-10: 20 mg via INTRAVENOUS

## 2014-12-10 MED ORDER — HYDRALAZINE HCL 20 MG/ML IJ SOLN: 10.0000 mg | Freq: Once | INTRAMUSCULAR | Status: DC

## 2014-12-10 MED ORDER — SODIUM CHLORIDE 0.9 % IV SOLN: 1.0000 g | Freq: Once | INTRAVENOUS | Status: DC

## 2014-12-10 MED ORDER — MIDAZOLAM HCL 2 MG/2ML IJ SOLN
1.0000 mg | INTRAMUSCULAR | Status: DC | PRN
Start: 2014-12-10 — End: 2014-12-11

## 2014-12-10 MED ORDER — HEPARIN SODIUM (PORCINE) 5000 UNIT/ML IJ SOLN: 4000.0000 [IU] | Freq: Once | INTRAMUSCULAR | Status: DC

## 2014-12-10 MED ORDER — MIDAZOLAM HCL 5 MG/5ML IJ SOLN: 2.0000 mg | INTRAMUSCULAR | Status: DC | PRN

## 2014-12-10 MED ORDER — SODIUM CHLORIDE 0.9 % IV SOLN: Freq: Once | INTRAVENOUS | Status: DC

## 2014-12-10 MED ORDER — IPRATROPIUM BROMIDE 0.02 % IN SOLN: 0.5000 mg | RESPIRATORY_TRACT | Status: DC | PRN

## 2014-12-10 MED ORDER — SODIUM CHLORIDE 0.9 % IJ SOLN: 3.0000 mL | Freq: Two times a day (BID) | INTRAMUSCULAR | Status: DC

## 2014-12-10 MED ORDER — SODIUM CHLORIDE 0.9 % IV SOLN
1.0000 g | Freq: Once | INTRAVENOUS | Status: AC
  Administered 2014-12-10: 1 g via INTRAVENOUS
  Filled 2014-12-10: qty 10

## 2014-12-10 MED ORDER — ETOMIDATE 2 MG/ML IV SOLN
INTRAVENOUS | Status: AC
  Administered 2014-12-10: 20 mg
  Filled 2014-12-10: qty 20

## 2014-12-10 MED ORDER — ROCURONIUM BROMIDE 50 MG/5ML IV SOLN
INTRAVENOUS | Status: AC
  Administered 2014-12-10: 60 mg
  Filled 2014-12-10: qty 2

## 2014-12-10 MED ORDER — PANTOPRAZOLE SODIUM 40 MG IV SOLR
40.0000 mg | Freq: Every day | INTRAVENOUS | Status: DC
  Filled 2014-12-10: qty 40

## 2014-12-10 MED ORDER — SODIUM CHLORIDE 0.9 % IJ SOLN: 3.0000 mL | INTRAMUSCULAR | Status: DC | PRN

## 2014-12-10 MED ORDER — FENTANYL CITRATE 0.05 MG/ML IJ SOLN
25.0000 ug/h | INTRAMUSCULAR | Status: DC
  Administered 2014-12-11: 50 ug/h via INTRAVENOUS
  Filled 2014-12-10: qty 50

## 2014-12-10 MED ORDER — SODIUM POLYSTYRENE SULFONATE 15 GM/60ML PO SUSP: 45.0000 g | Freq: Once | ORAL | Status: DC

## 2014-12-10 MED ORDER — FENTANYL BOLUS VIA INFUSION
25.0000 ug | INTRAVENOUS | Status: DC | PRN
  Filled 2014-12-10: qty 25

## 2014-12-10 MED ORDER — ROCURONIUM BROMIDE 50 MG/5ML IV SOLN
1.0000 mg/kg | Freq: Once | INTRAVENOUS | Status: AC
  Administered 2014-12-10: 10 mg via INTRAVENOUS

## 2014-12-10 MED ORDER — SODIUM CHLORIDE 0.9 % IV SOLN
250.0000 mL | INTRAVENOUS | Status: DC | PRN
  Administered 2014-12-11: 250 mL via INTRAVENOUS

## 2014-12-10 MED ORDER — SODIUM CHLORIDE 0.9 % IV SOLN: 250.0000 mL | INTRAVENOUS | Status: DC | PRN

## 2014-12-10 MED ORDER — ASPIRIN 81 MG PO CHEW: 324.0000 mg | CHEWABLE_TABLET | Freq: Once | ORAL | Status: DC

## 2014-12-10 MED ORDER — SODIUM BICARBONATE 8.4 % IV SOLN
Freq: Once | INTRAVENOUS | Status: DC
  Filled 2014-12-10: qty 100

## 2014-12-10 MED ORDER — FENTANYL CITRATE 0.05 MG/ML IJ SOLN: 25.0000 ug | INTRAMUSCULAR | Status: DC | PRN

## 2014-12-10 MED ORDER — HYDRALAZINE HCL 20 MG/ML IJ SOLN: 10.0000 mg | INTRAMUSCULAR | Status: DC | PRN

## 2014-12-10 MED ORDER — INSULIN ASPART 100 UNIT/ML IV SOLN
10.0000 [IU] | Freq: Once | INTRAVENOUS | Status: AC
  Administered 2014-12-10: 10 [IU] via INTRAVENOUS
  Filled 2014-12-10: qty 1

## 2014-12-10 MED ORDER — DEXTROSE 50 % IV SOLN
1.0000 | Freq: Once | INTRAVENOUS | Status: AC
  Administered 2014-12-10: 50 mL via INTRAVENOUS
  Filled 2014-12-10: qty 50

## 2014-12-10 MED ORDER — LIDOCAINE HCL (CARDIAC) 20 MG/ML IV SOLN
INTRAVENOUS | Status: AC
  Filled 2014-12-10: qty 5

## 2014-12-10 MED ORDER — CALCIUM CHLORIDE 10 % IV SOLN
INTRAVENOUS | Status: AC
  Administered 2014-12-10: 1000 mg
  Filled 2014-12-10: qty 20

## 2014-12-10 NOTE — ED Notes (Signed)
cbg 176 

## 2014-12-10 NOTE — ED Notes (Signed)
Low temp reported to dr schertz  93.8  r

## 2014-12-10 NOTE — ED Notes (Signed)
Cal chloride amp iv k munnett rn

## 2014-12-10 NOTE — ED Notes (Signed)
Roc 60 mg iv per k munnett rn at the io

## 2014-12-10 NOTE — ED Notes (Signed)
Etomidate 2omg k munnette

## 2014-12-10 NOTE — ED Notes (Signed)
The pthas to be reintubated because the  Tube is not effective.

## 2014-12-10 NOTE — ED Notes (Signed)
18 og per k munnett rn

## 2014-12-10 NOTE — ED Notes (Signed)
Lt ej  18 angiocath per ed res

## 2014-12-10 NOTE — Progress Notes (Signed)
Pt was intubated by ED MD and placed on ventilatior. RT noticed that patient was not getting adequate volumes and changed ventilators while tech manually bagged patient. Changing out ventilators did not work, RT checked cuff pressure and there was no pressure in the cuff, RT tried filling pilot balloon and that did not work either. RT made MD aware and patient was reintubated using bougie a new 7.5 ETT and glidescope to check placement. Cords we visualized both intubations as well as CXR to confirm placement and positive end tidal change and BBS. Cuff is now inflated to 30 and pt is now getting adequate volumes. RT to monitor.

## 2014-12-10 NOTE — ED Notes (Signed)
The pt arrived  After  Being found unresponsive.  No dialysis for 2 weeks brady on ems arrival.

## 2014-12-10 NOTE — ED Notes (Signed)
Pt being bagged while waiting   To be re-intubated

## 2014-12-10 NOTE — Consult Note (Signed)
Renal Service Consult Note [facility]  Emrah Ariola 12-19-14 Tarsha Blando D Requesting Physician:  Dr Manus Gunning  Reason for Consult:  ESRD pt with resp failure and hyperkalemia HPI: The patient is a 74 y.o. year-old with hx of ESRD on HD, HTN, ICM, DM living at [facility], missed HD for about 2 weeks and was sent to ED from SNF with AMS.  Intubated in the ED, ABG 7.027 / 92 / 166. EKG sinus brady with wideQRS 188 msec.  Asked to see for suspected hyperkalemia.      Chart review: 6/10 - lumbar stenosis, back pain w prox weakness, failed conservtaive rxk diffuse stenosis L2 to L5/S1 > underwent surgery w bilat laminectomy, arthrodesis w autograft.  Sig postop pain, poor physical activity.   5/15 - acute encephalopathy due to missing/ skipping HD treatments, ESRD on HD > "AMS resolved with HD. Palliative care asked to see pt about whether he wants to continue dialysis. Family was difficult to reach , meeting was set up but they did not show.  Depression, refusal of meds and dialysis in hospital.  Per dc summary, pt has said multiple times, "I just want to die".   7/15 - hyperkalemia, ICM, DM, acute encephalopathy, malnutrition moderate degree, chronic syst CHF, bradycardia, HTN, ESRD on HD, medical noncompliance. Patient missing dialysis, K 6.9. Had been refusing dialysis at the SNF many times saying he just wants to die.  Psych saw pt and deemed he didn't have capacity to make decisions for himself. HD was done and dc'd back to SNF.  Outpt HD with [facility] group.     ROS  not available  Past Medical History  Past Medical History  Diagnosis Date  . 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 Hypertension   . CAD (coronary artery disease)      with angioplasty  . 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 Cardiomyopathy     with ejection fraction of 45 %  . Heart failure, systolic and diastolic   . Anemia   . CHF (congestive heart failure)     Past Surgical History  Past Surgical History  Procedure Laterality Date  . Spine surgery      spinal fusion  . Av fistula placement Left 01/16/2014    Procedure: ARTERIOVENOUS (AV) FISTULA CREATION; ULTRASOUND GUIDED;  Surgeon: Larina Earthly, MD;  Location: [facility] OR;  Service: Vascular;  Laterality: Left;   Family History No family history on file. Social History  reports that he has never smoked. He has never used smokeless tobacco. He reports that he does not drink alcohol or use illicit drugs. Allergies  Allergies  Allergen Reactions  . Lisinopril Other (See Comments)    Per MAR   Home medications Prior to Admission medications   Medication Sig Start Date End Date Taking? Authorizing Provider  AMBULATORY NON FORMULARY MEDICATION Lorazepam /ml Gel Sig: Apply 1ml topically twice daily as needed for anxiety 05/22/14   Tiffany L Reed, DO  Amino Acids-Protein Hydrolys (FEEDING SUPPLEMENT, PRO-STAT SUGAR FREE 64,) LIQD Take 30 mLs by mouth 2 (two) times daily. 10am and 10pm 04/11/14   Kela Millin, MD  amLODipine (NORVASC) 10 MG tablet Take 1 tablet (10 mg total) by mouth daily. 05/19/14   Kathlen Mody, MD  aspirin 81 MG tablet Take 81 mg by mouth daily.    Historical Provider, MD  atorvastatin (LIPITOR) 80 MG tablet Take 80 mg by  mouth at bedtime. For hypercholesterolemia    Historical Provider, MD  B Complex-C (SUPER B COMPLEX) TABS Take 1 tablet by mouth daily.     Historical Provider, MD  buPROPion (WELLBUTRIN XL) 150 MG 24 hr tablet Take 1 tablet (150 mg total) by mouth daily. 05/19/14   Kathlen Mody, MD  calcium acetate (PHOSLO) 667 MG capsule Take 667-2,001 mg by mouth 4 (four) times daily - after meals and at bedtime. Take 3 capsules (2001 mg) 3 times daily with meals, take 1 capsule (667 mg) every night at bedtime with snack    Historical Provider, MD  clopidogrel (PLAVIX) 75 MG tablet Take 75 mg by mouth daily.     Historical Provider, MD  feeding supplement, RESOURCE BREEZE,  (RESOURCE BREEZE) LIQD Take 1 Container by mouth 2 (two) times daily between meals. 05/19/14   Kathlen Mody, MD  ferrous sulfate 325 (65 FE) MG tablet Take 325 mg by mouth 3 (three) times daily with meals. 10am, 2pm, 10pm    Historical Provider, MD  hydrALAZINE (APRESOLINE) 50 MG tablet Take 1 tablet (50 mg total) by mouth 3 (three) times daily. For HTN (10am, 2pm, 10pm) 04/11/14   Kela Millin, MD  hydrOXYzine (ATARAX/VISTARIL) 25 MG tablet Take 25 mg by mouth every 4 (four) hours as needed for anxiety.     Historical Provider, MD  insulin aspart (NOVOLOG) 100 UNIT/ML injection CBG 70 - 120: 0 units CBG 121 - 150: 1 unit CBG 151 - 200: 2 units CBG 201 - 250: 3 units CBG 251 - 300: 5 units CBG 301 - 350: 7 units CBG 351 - 400: 9 units 05/19/14   Kathlen Mody, MD  isosorbide mononitrate (IMDUR) 60 MG 24 hr tablet Take 60 mg by mouth daily. For HTN    Historical Provider, MD  mirtazapine (REMERON) 15 MG tablet Take 15 mg by mouth at bedtime.    Historical Provider, MD  nitroGLYCERIN (NITROSTAT) 0.4 MG SL tablet Place 0.4 mg under the tongue every 5 (five) minutes as needed for chest pain (May repeat for 3 doses as needed for chest pain. If chest pain persist call the MD.).    Historical Provider, MD  Nutritional Supplements (FEEDING SUPPLEMENT, NEPRO CARB STEADY,) LIQD Take 237 mLs by mouth as needed (missed meal during dialysis.). 04/11/14   Kela Millin, MD  ondansetron (ZOFRAN) 4 MG tablet Take 4 mg by mouth every 8 (eight) hours as needed for nausea or vomiting.    Historical Provider, MD  oxyCODONE-acetaminophen (PERCOCET/ROXICET) 5-325 MG per tablet Take one tablet by mouth every 6 hours as needed for pain 05/22/14   Tiffany L Reed, DO  pantoprazole (PROTONIX) 40 MG tablet Take 40 mg by mouth 2 (two) times daily. For esophageal reflux (6:30am & 4:30pm)    Historical Provider, MD  PRESCRIPTION MEDICATION Take 1 packet by mouth daily. Novosource (supplement)    Historical Provider, MD   Liver  Function Tests No results for input(s): AST, ALT, ALKPHOS, BILITOT, PROT, ALBUMIN in the last 168 hours. No results for input(s): LIPASE, AMYLASE in the last 168 hours. CBC No results for input(s): WBC, NEUTROABS, HGB, HCT, MCV, PLT in the last 168 hours. Basic Metabolic Panel No results for input(s): NA, K, CL, CO2, GLUCOSE, BUN, CREATININE, CALCIUM, PHOS in the last 168 hours.  Filed Vitals:   Jan 08, 2015 2056 01/08/15 2105  BP: 102/46 128/47  Pulse: 70 45  SpO2: 99% 100%   Exam On vent, unresponsive No rash, cyanosis or gangrene  Sclera anicteric, throat clear No jvd Chest coarse BS bilat RRR no MRG Abd is soft, NTND, no ascites +1-2 LE edema bilat Neuro is obtunded  Assessment: 1. AMS 2. Bradycardia / widened QRS - suspected hyperkalemia from missed HD 3. ESRD on HD in [facility] - has missed about 2 weeks of dialysis 4. Ischemic CM / syst CHF 5. Anemia of CKD 6. CAD hx PTA 7. DM on insulin   Plan- family discussion held with multiple family members , the son acted as Equities trader.   Told them that most likely patient is suffering a great deal with living in SNF on chronic dialysis for over 2 years. He has asked many times in the last 2 years and documented in the medical record to "just let me die" but family has struggled with the idea of stopping HD.  After consideration the decision has been made to stop dialysis permanently.  Family is going to be coming in from many different areas and if possible they would like [place] to continue support measures other than dialysis until other family arrives.  They are aware how sick he is and that he may succumb but they understand that we will try.  DNR.  Will follow.Vinson Moselle MD (pgr) [phone]    ([phone] 18-Dec-2014, 9:06 PM

## 2014-12-10 NOTE — ED Notes (Signed)
Both pupils equal 2.0 non-reactive .  No sedation  No movement of the pt

## 2014-12-10 NOTE — ED Notes (Signed)
Calcium  Chloride bristojet iv per k munnett rn

## 2014-12-10 NOTE — ED Notes (Signed)
Not intubated.  Bagging the pt to attempt another intubation

## 2014-12-10 NOTE — ED Notes (Signed)
Dr Arlean Hopping has spoken to the family anf they only want him on the vent until the rest of the pts family has arrived

## 2014-12-10 NOTE — ED Notes (Signed)
20 angiocath rt hand loked.

## 2014-12-10 NOTE — ED Notes (Signed)
Sod bicarb amp iv per [facility]

## 2014-12-10 NOTE — ED Notes (Signed)
Sod bicard amp iv k munnettrn

## 2014-12-10 NOTE — ED Notes (Signed)
stemi cancelled by dr rancour

## 2014-12-10 NOTE — ED Notes (Signed)
Pt re-intubated by the ed res.

## 2014-12-10 NOTE — ED Notes (Signed)
Cal amp k munnettrn iv

## 2014-12-10 NOTE — Progress Notes (Signed)
Patient was first brought in as a code Stemi. His status was changed due to the fact that he had not been keeping his appointment for dialysis. After consulting with family it was decided that patient would receive comfort measures only. Patient was made a DNR.

## 2014-12-10 NOTE — H&P (Signed)
PULMONARY / CRITICAL CARE MEDICINE   Name: Albert Grant MRN: [id number] DOB: 04-16-1941    ADMISSION DATE:  01-08-2015 CONSULTATION DATE:  2015-01-08  REFERRING MD :  EDP  CHIEF COMPLAINT:  AMS  INITIAL PRESENTATION: 74 yo male ESRD on HD, CAD, DM presents to [facility] ED after missing dialysis. In ED found to be bradycardic and  hyperkalemic above readable level. K was treated medically in ED. Family tells nephrologist there will be no more dialysis as patient has been refusing for some time now. PCCM to admit.   STUDIES:    SIGNIFICANT EVENTS:   HISTORY OF PRESENT ILLNESS:  74 year old male with PMH as below, which includes ESRD on HD, CAD, and DM. He has been refusing dialysis for a long time, but especially the last two weeks. He has similar admissions in the past.  1/31 he was noted to have an alteration in his mental status and was brought to [facility] ED. In ED he was bradycardic and was found to be hyperkalemic above a readable level. He was medically treated for hyperkalemia in ED with Ca++ and bicarb. Nephrology was consulted for potential HD. He spoke with family who has been struggling to get patient to go to HD for sometime. He has not wanted to go, and has wanted to die. Family has agreed they will not pursue any further dialysis, but do request medical management hopefully until family is able to come to hospital. PCCM asked to see for admission.   PAST MEDICAL HISTORY :   has a past medical history of Diabetes mellitus without complication; Hypertension; CAD (coronary artery disease); End stage renal disease on dialysis; Cardiomyopathy; Heart failure, systolic and diastolic; Anemia; and CHF (congestive heart failure).  has past surgical history that includes Spine surgery and AV fistula placement (Left, 01/16/2014). Prior to Admission medications   Medication Sig Start Date End Date Taking? Authorizing Provider  AMBULATORY NON FORMULARY MEDICATION Lorazepam 1mg /ml Gel Sig: Apply 25ml  topically twice daily as needed for anxiety 05/22/14   Tiffany L Reed, DO  Amino Acids-Protein Hydrolys (FEEDING SUPPLEMENT, PRO-STAT SUGAR FREE 64,) LIQD Take 30 mLs by mouth 2 (two) times daily. 10am and 10pm 04/11/14   Kela Millin, MD  amLODipine (NORVASC) 10 MG tablet Take 1 tablet (10 mg total) by mouth daily. 05/19/14   Kathlen Mody, MD  aspirin 81 MG tablet Take 81 mg by mouth daily.    Historical Provider, MD  atorvastatin (LIPITOR) 80 MG tablet Take 80 mg by mouth at bedtime. For hypercholesterolemia    Historical Provider, MD  B Complex-C (SUPER B COMPLEX) TABS Take 1 tablet by mouth daily.     Historical Provider, MD  buPROPion (WELLBUTRIN XL) 150 MG 24 hr tablet Take 1 tablet (150 mg total) by mouth daily. 05/19/14   Kathlen Mody, MD  calcium acetate (PHOSLO) 667 MG capsule Take 667-2,001 mg by mouth 4 (four) times daily - after meals and at bedtime. Take 3 capsules (2001 mg) 3 times daily with meals, take 1 capsule (667 mg) every night at bedtime with snack    Historical Provider, MD  clopidogrel (PLAVIX) 75 MG tablet Take 75 mg by mouth daily.     Historical Provider, MD  feeding supplement, RESOURCE BREEZE, (RESOURCE BREEZE) LIQD Take 1 Container by mouth 2 (two) times daily between meals. 05/19/14   Kathlen Mody, MD  ferrous sulfate 325 (65 FE) MG tablet Take 325 mg by mouth 3 (three) times daily with meals. 10am, 2pm,  10pm    Historical Provider, MD  hydrALAZINE (APRESOLINE) 50 MG tablet Take 1 tablet (50 mg total) by mouth 3 (three) times daily. For HTN (10am, 2pm, 10pm) 04/11/14   Kela Millin, MD  hydrOXYzine (ATARAX/VISTARIL) 25 MG tablet Take 25 mg by mouth every 4 (four) hours as needed for anxiety.     Historical Provider, MD  insulin aspart (NOVOLOG) 100 UNIT/ML injection CBG 70 - 120: 0 units CBG 121 - 150: 1 unit CBG 151 - 200: 2 units CBG 201 - 250: 3 units CBG 251 - 300: 5 units CBG 301 - 350: 7 units CBG 351 - 400: 9 units 05/19/14   Kathlen Mody, MD  isosorbide  mononitrate (IMDUR) 60 MG 24 hr tablet Take 60 mg by mouth daily. For HTN    Historical Provider, MD  mirtazapine (REMERON) 15 MG tablet Take 15 mg by mouth at bedtime.    Historical Provider, MD  nitroGLYCERIN (NITROSTAT) 0.4 MG SL tablet Place 0.4 mg under the tongue every 5 (five) minutes as needed for chest pain (May repeat for 3 doses as needed for chest pain. If chest pain persist call the MD.).    Historical Provider, MD  Nutritional Supplements (FEEDING SUPPLEMENT, NEPRO CARB STEADY,) LIQD Take 237 mLs by mouth as needed (missed meal during dialysis.). 04/11/14   Kela Millin, MD  ondansetron (ZOFRAN) 4 MG tablet Take 4 mg by mouth every 8 (eight) hours as needed for nausea or vomiting.    Historical Provider, MD  oxyCODONE-acetaminophen (PERCOCET/ROXICET) 5-325 MG per tablet Take one tablet by mouth every 6 hours as needed for pain 05/22/14   Tiffany L Reed, DO  pantoprazole (PROTONIX) 40 MG tablet Take 40 mg by mouth 2 (two) times daily. For esophageal reflux (6:30am & 4:30pm)    Historical Provider, MD  PRESCRIPTION MEDICATION Take 1 packet by mouth daily. Novosource (supplement)    Historical Provider, MD   Allergies  Allergen Reactions  . Lisinopril Other (See Comments)    Per MAR    FAMILY HISTORY:  indicated that his mother is deceased. He indicated that his father is deceased.  SOCIAL HISTORY:  reports that he has never smoked. He has never used smokeless tobacco. He reports that he does not drink alcohol or use illicit drugs.  REVIEW OF SYSTEMS:  Unable due to encephalopahty  SUBJECTIVE:   VITAL SIGNS: Pulse Rate:  [45-70] 56 (01/31 2134) BP: (102-175)/(41-54) 175/54 mmHg (01/31 2134) SpO2:  [98 %-100 %] 98 % (01/31 2127) HEMODYNAMICS:   VENTILATOR SETTINGS:   INTAKE / OUTPUT: No intake or output data in the 24 hours ending Jan 07, 2015 2139  PHYSICAL EXAMINATION: General:  Elderly male on vent Neuro:  Obtunded HEENT:  [place]/AT, no JVD noted Cardiovascular:  [place] Foley,  regular. No MRG Lungs:  Clear bilateral breath sounds Abdomen:  Soft, non-tender, non-distended Musculoskeletal:  No acute deformity Skin:  Grossly intact  LABS:  CBC  Recent Labs Lab 01/07/15 2051  WBC 10.9*  HGB 11.5*  HCT 35.6*  PLT 186   Coag's  Recent Labs Lab 01-07-2015 2051  APTT 37  INR 2.38*   BMET No results for input(s): NA, K, CL, CO2, BUN, CREATININE, GLUCOSE in the last 168 hours. Electrolytes No results for input(s): CALCIUM, MG, PHOS in the last 168 hours. Sepsis Markers  Recent Labs Lab 01/07/15 2101  LATICACIDVEN 4.50*   ABG  Recent Labs Lab January 07, 2015 2113  PHART 7.027*  PCO2ART 92.2*  PO2ART 166.0*   Liver Enzymes  No results for input(s): AST, ALT, ALKPHOS, BILITOT, ALBUMIN in the last 168 hours. Cardiac Enzymes No results for input(s): TROPONINI, PROBNP in the last 168 hours. Glucose  Recent Labs Lab 12-23-14 2056  GLUCAP 176*    Imaging No results found.   ASSESSMENT / PLAN:  PULMONARY OETT 1/31 > A: Acute hypercarbic respiratory failure  P:   Vent support VAP Bundle Titrate FiO2 to maintain SpO2 greater than 90% Check post intubation ABG  CARDIOVASCULAR A:  Bradycardia 2nd to hyperkalemia HTN Chronic systolic CHF  P:   Telemetry monitoring Hold home PO medications Hydralazine PRN  RENAL A:   ESRD on HD Hyperkalemia - treated with temporizing therapies in ED  P:   No more HD per family Kayexalate now q4 hour bmet  Treat K medically as needed or for wide QRS  GASTROINTESTINAL A:   GERD  P:   NPO PPI  HEMATOLOGIC A:   Anemia of chronic illness  P:  Follow CBC SCDs  INFECTIOUS A: No acute evidence for infection  P:   Monitor clinically   ENDOCRINE A:   DM  P:   CBG and SSI  NEUROLOGIC A:   Acute encephalopathy  P:   RASS goal: -1 Fentanyl gtt for pain control Monitor   FAMILY  - Updates:   - Inter-disciplinary family meet or Palliative Care meeting due by:   2/6   TODAY'S SUMMARY: Long discussion with family. Son is primary source of communication and best english speaker. Patient has been asking to stop dialysis for some time, and knew the consequences of that. Son has agreed to pursue no further dialysis at this time and allow his father to pass peacefully. However, it is important culturally for the family to see him before he passes because he is an elder. I informed the son that we can attempt to manage his electrolytes medically, however there is only so much we can do in that regard. He is aware that this is only prolonging his death and potentially suffering, however this is important to them culturally. Should he code despite our best medical efforts he will be DNR.     CC time: 45 mins  Joneen Roach, AGACNP-BC [facility] Pulmonology/Critical Care Pager [phone] or [phone]

## 2014-12-10 NOTE — ED Notes (Signed)
Dr schertz at the bedside 

## 2014-12-10 NOTE — ED Provider Notes (Signed)
CSN: [id number]     Arrival date & time    History   First MD Initiated Contact with Patient 11/12/2014 2054     Chief Complaint  Patient presents with  . Code STEMI     (Consider location/radiation/quality/duration/timing/severity/associated sxs/prior Treatment) HPI  74 year old male with past history as below notable for his renal disease on HD who resides in a nursing home who presents ED after being found unresponsive. Apparently, patient has been refusing dialysis treatments. This is something he has done in the past but his family has been making him get dialysis. EMS reports on their arrival patient was unresponsive, breathing voluntarily and diarrhea around 6 times a minute. Patient was bagged in route. Patient is noted to be bradycardic in the 30s to 40s with initial blood pressure of 70 systolic. EMS gave 1 amp of calcium chloride and 1 amp of bicarbonate prior to arrival. Per family report, they wanted all resuscitative  measures performed.    Past Medical History  Diagnosis Date  . 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 Hypertension   . CAD (coronary artery disease)      with angioplasty  . 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 Cardiomyopathy     with ejection fraction of 45 %  . Heart failure, systolic and diastolic   . Anemia   . CHF (congestive heart failure)    Past Surgical History  Procedure Laterality Date  . Spine surgery      spinal fusion  . Av fistula placement Left 01/16/2014    Procedure: ARTERIOVENOUS (AV) FISTULA CREATION; ULTRASOUND GUIDED;  Surgeon: Larina Earthly, MD;  Location: [facility] OR;  Service: Vascular;  Laterality: Left;   No family history on file. History  Substance Use Topics  . Smoking status: Never Smoker   . Smokeless tobacco: Never Used  . Alcohol Use: No    Review of Systems Unable to obtain secondary to patient condition   Allergies  Lisinopril  Home Medications   Prior to Admission medications    Medication Sig Start Date End Date Taking? Authorizing Provider  AMBULATORY NON FORMULARY MEDICATION Lorazepam 1mg /ml Gel Sig: Apply 68ml topically twice daily as needed for anxiety 05/22/14   Tiffany L Reed, DO  Amino Acids-Protein Hydrolys (FEEDING SUPPLEMENT, PRO-STAT SUGAR FREE 64,) LIQD Take 30 mLs by mouth 2 (two) times daily. 10am and 10pm 04/11/14   Kela Millin, MD  amLODipine (NORVASC) 10 MG tablet Take 1 tablet (10 mg total) by mouth daily. 05/19/14   Kathlen Mody, MD  aspirin 81 MG tablet Take 81 mg by mouth daily.    Historical Provider, MD  atorvastatin (LIPITOR) 80 MG tablet Take 80 mg by mouth at bedtime. For hypercholesterolemia    Historical Provider, MD  B Complex-C (SUPER B COMPLEX) TABS Take 1 tablet by mouth daily.     Historical Provider, MD  buPROPion (WELLBUTRIN XL) 150 MG 24 hr tablet Take 1 tablet (150 mg total) by mouth daily. 05/19/14   Kathlen Mody, MD  calcium acetate (PHOSLO) 667 MG capsule Take 667-2,001 mg by mouth 4 (four) times daily - after meals and at bedtime. Take 3 capsules (2001 mg) 3 times daily with meals, take 1 capsule (667 mg) every night at bedtime with snack    Historical Provider, MD  clopidogrel (PLAVIX) 75 MG tablet Take 75 mg by mouth daily.     Historical Provider, MD  feeding supplement, RESOURCE  BREEZE, (RESOURCE BREEZE) LIQD Take 1 Container by mouth 2 (two) times daily between meals. 05/19/14   Kathlen Mody, MD  ferrous sulfate 325 (65 FE) MG tablet Take 325 mg by mouth 3 (three) times daily with meals. 10am, 2pm, 10pm    Historical Provider, MD  hydrALAZINE (APRESOLINE) 50 MG tablet Take 1 tablet (50 mg total) by mouth 3 (three) times daily. For HTN (10am, 2pm, 10pm) 04/11/14   Kela Millin, MD  hydrOXYzine (ATARAX/VISTARIL) 25 MG tablet Take 25 mg by mouth every 4 (four) hours as needed for anxiety.     Historical Provider, MD  insulin aspart (NOVOLOG) 100 UNIT/ML injection CBG 70 - 120: 0 units CBG 121 - 150: 1 unit CBG 151 - 200: 2  units CBG 201 - 250: 3 units CBG 251 - 300: 5 units CBG 301 - 350: 7 units CBG 351 - 400: 9 units 05/19/14   Kathlen Mody, MD  isosorbide mononitrate (IMDUR) 60 MG 24 hr tablet Take 60 mg by mouth daily. For HTN    Historical Provider, MD  mirtazapine (REMERON) 15 MG tablet Take 15 mg by mouth at bedtime.    Historical Provider, MD  nitroGLYCERIN (NITROSTAT) 0.4 MG SL tablet Place 0.4 mg under the tongue every 5 (five) minutes as needed for chest pain (May repeat for 3 doses as needed for chest pain. If chest pain persist call the MD.).    Historical Provider, MD  Nutritional Supplements (FEEDING SUPPLEMENT, NEPRO CARB STEADY,) LIQD Take 237 mLs by mouth as needed (missed meal during dialysis.). 04/11/14   Kela Millin, MD  ondansetron (ZOFRAN) 4 MG tablet Take 4 mg by mouth every 8 (eight) hours as needed for nausea or vomiting.    Historical Provider, MD  oxyCODONE-acetaminophen (PERCOCET/ROXICET) 5-325 MG per tablet Take one tablet by mouth every 6 hours as needed for pain 05/22/14   Tiffany L Reed, DO  pantoprazole (PROTONIX) 40 MG tablet Take 40 mg by mouth 2 (two) times daily. For esophageal reflux (6:30am & 4:30pm)    Historical Provider, MD  PRESCRIPTION MEDICATION Take 1 packet by mouth daily. Novosource (supplement)    Historical Provider, MD   BP 128/47 mmHg  Pulse 45  SpO2 100% Physical Exam  Constitutional: He appears ill. No distress.  HENT:  Mouth/Throat: Mucous membranes are normal.  Dried blood around pt mouth. NPA in R nares  Eyes:  Pupils 2mm and nonreactive.  Neck: Trachea normal. No JVD present. No tracheal deviation present.  Cardiovascular: Regular rhythm, S1 normal and S2 normal.  Bradycardia present.   No murmur heard. Pulmonary/Chest: Bradypnea noted. He has rhonchi (diffuse).  Abdominal: Soft. Normal appearance. He exhibits no pulsatile midline mass and no mass. There is no rigidity.  Musculoskeletal:  No signs of infection overlying any joint  Neurological:  He is unresponsive. GCS eye subscore is 1. GCS verbal subscore is 1. GCS motor subscore is 1.  Skin: Skin is warm, dry and intact. No rash noted.  Nursing note and vitals reviewed.   ED Course  INTUBATION Date/Time: 12/28/2014 11:07 PM Performed by: Ames Dura Authorized by: Ames Dura Consent: The procedure was performed in an emergent situation. Indications: airway protection Intubation method: video-assisted Patient status: paralyzed (RSI) Preoxygenation: BVM Sedatives: etomidate Paralytic: rocuronium Laryngoscope size: Mac 4 Tube size: 7.5 mm Tube type: cuffed Number of attempts: 2 (first attempt with MAC 4 unsuccessful. No cords visualized.) Ventilation between attempts: BVM Cricoid pressure: no Cords visualized: yes Post-procedure assessment: CO2 detector Breath  sounds: equal and absent over the epigastrium Cuff inflated: yes ETT to lip: 26 cm ETT to teeth: 25 cm Tube secured with: ETT holder and adhesive tape Chest x-ray interpreted by radiologist and me. Chest x-ray findings: endotracheal tube in appropriate position Patient tolerance: Patient tolerated the procedure well with no immediate complications   (including critical care time) Labs Review Labs Reviewed  CBC - Abnormal; Notable for the following:    WBC 10.9 (*)    RBC 3.52 (*)    Hemoglobin 11.5 (*)    HCT 35.6 (*)    MCV 101.1 (*)    All other components within normal limits  DIFFERENTIAL - Abnormal; Notable for the following:    Neutrophils Relative % 92 (*)    Neutro Abs 10.0 (*)    Lymphocytes Relative 2 (*)    Lymphs Abs 0.2 (*)    All other components within normal limits  PROTIME-INR - Abnormal; Notable for the following:    Prothrombin Time 26.2 (*)    INR 2.38 (*)    All other components within normal limits  I-STAT CG4 LACTIC ACID, ED - Abnormal; Notable for the following:    Lactic Acid, Venous 4.50 (*)    All other components within normal limits  I-STAT TROPOININ, ED -  Abnormal; Notable for the following:    Troponin i, poc 0.41 (*)    All other components within normal limits  I-STAT ARTERIAL BLOOD GAS, ED - Abnormal; Notable for the following:    pH, Arterial 7.027 (*)    pCO2 arterial 92.2 (*)    pO2, Arterial 166.0 (*)    Bicarbonate 24.2 (*)    Acid-base deficit 8.0 (*)    All other components within normal limits  CBG MONITORING, ED - Abnormal; Notable for the following:    Glucose-Capillary 176 (*)    All other components within normal limits  I-STAT ARTERIAL BLOOD GAS, ED - Abnormal; Notable for the following:    pH, Arterial 7.155 (*)    pO2, Arterial 142.0 (*)    Bicarbonate 15.5 (*)    Acid-base deficit 13.0 (*)    All other components within normal limits  APTT  BASIC METABOLIC PANEL  TROPONIN I  BASIC METABOLIC PANEL  BASIC METABOLIC PANEL  BASIC METABOLIC PANEL  BASIC METABOLIC PANEL  BLOOD GAS, ARTERIAL  CBC  I-STAT CHEM 8, ED    Imaging Review Dg Chest Portable 1 View  01-06-2015   CLINICAL DATA:  Code ST-elevation MI. Endotracheal and orogastric tube placement.  EXAM: PORTABLE CHEST - 1 VIEW  COMPARISON:  Chest radiograph 05/11/2014, 04/05/2014  FINDINGS: Endotracheal tube is approximately 4.8 cm from the carina. The tip and side port of the enteric tube are below the diaphragm in the stomach. Cardiomegaly, similar to prior exam. Atherosclerosis of the thoracic aorta is again seen. Minimal ill-defined airspace opacity in the right midlung zone. Unchanged mild elevation right hemidiaphragm. There is no pulmonary edema. No large pleural effusion or pneumothorax. No acute osseous abnormalities seen.  IMPRESSION: 1. Endotracheal tube 4.8 cm from the carina, and enteric tube below the diaphragm. 2. Stable cardiomegaly. 3. Ill-defined opacity in the right mid lung zone, likely atelectasis.   Electronically Signed   By: Rubye Oaks M.D.   On: 01/06/15 21:34     EKG Interpretation   Date/Time:  Sunday January 06, 2015 20:53:13  EST Ventricular Rate:  40 PR Interval:  182 QRS Duration: 188 QT Interval:  559 QTC Calculation: 456 R Axis:  97 Text Interpretation:  Sinus bradycardia Ventricular premature complex  Nonspecific intraventricular conduction delay Baseline wander in lead(s)  V1 wide complex QRS with bradycardia Confirmed by Manus Gunning  MD, Fermon Ureta  [phone]) on 23-Dec-2014 9:55:43 PM      MDM   Final diagnoses:  None    On arrival, patient  is being bagged by EMS. GCS is less than 8. Patient was subsequently intubated as above. Patient was given additional doses of calcium chloride, bicarbonate. Please see nursing documentation for exact timing and dosages. Discussion with critical care regarding patient and they're familiar with the patient. Apparently patient has refused dialysis in the past and has been wanting to die. Nephrology was consult for emergent dialysis. Nephrology discussed grim outcome with family and family has elected comfort measures only. We will continue to treat hyperkalemia medically. ETT had leak, not holding pressure. Pt still 100% SpO2. ETT exchanged and vent putting out good volumes.  Pt noted to have steadily increasing BP. Discussed with critical care, would like HCT and hydralazine. No gtt at this time. Pt is admitted to Critical Care.  Pt seen in conjunction with Dr. Glynn Octave, MD  Ames Dura, DO [facility] Emergency Medicine Resident - PGY-2    Ames Dura, MD 2014/12/23 6045  Glynn Octave, MD 12/21/2014 [phone]

## 2014-12-10 NOTE — ED Notes (Signed)
Dr Arlean Hopping has discd the bicarb drip

## 2014-12-10 NOTE — ED Notes (Signed)
Critical care md at the bedside

## 2014-12-11 DIAGNOSIS — J9602 Acute respiratory failure with hypercapnia: Secondary | ICD-10-CM | POA: Insufficient documentation

## 2014-12-11 LAB — MRSA PCR SCREENING: MRSA by PCR: NEGATIVE

## 2014-12-11 LAB — BASIC METABOLIC PANEL
ANION GAP: 29 — AB (ref 5–15)
Anion gap: 27 — ABNORMAL HIGH (ref 5–15)
Anion gap: 33 — ABNORMAL HIGH (ref 5–15)
BUN: 227 mg/dL — AB (ref 6–23)
BUN: 226 mg/dL — ABNORMAL HIGH (ref 6–23)
BUN: 229 mg/dL — AB (ref 6–23)
CALCIUM: 8.7 mg/dL (ref 8.4–10.5)
CALCIUM: 6.7 mg/dL — AB (ref 8.4–10.5)
CO2: 15 mmol/L — ABNORMAL LOW (ref 19–32)
CO2: 17 mmol/L — ABNORMAL LOW (ref 19–32)
CO2: 12 mmol/L — AB (ref 19–32)
CREATININE: 15.51 mg/dL — AB (ref 0.50–1.35)
CREATININE: 15.64 mg/dL — AB (ref 0.50–1.35)
Calcium: 7.4 mg/dL — ABNORMAL LOW (ref 8.4–10.5)
Chloride: 94 mmol/L — ABNORMAL LOW (ref 96–112)
Chloride: 99 mmol/L (ref 96–112)
Chloride: 98 mmol/L (ref 96–112)
Creatinine, Ser: 15.8 mg/dL — ABNORMAL HIGH (ref 0.50–1.35)
GFR calc Af Amer: 3 mL/min — ABNORMAL LOW (ref >90)
GFR calc Af Amer: 3 mL/min — ABNORMAL LOW (ref >90)
GFR calc Af Amer: 3 mL/min — ABNORMAL LOW (ref >90)
GFR calc non Af Amer: 3 mL/min — ABNORMAL LOW (ref >90)
GFR calc non Af Amer: 3 mL/min — ABNORMAL LOW (ref >90)
GFR, EST NON AFRICAN AMERICAN: 3 mL/min — AB (ref >90)
Glucose, Bld: 264 mg/dL — ABNORMAL HIGH (ref 70–99)
Glucose, Bld: 121 mg/dL — ABNORMAL HIGH (ref 70–99)
Glucose, Bld: 114 mg/dL — ABNORMAL HIGH (ref 70–99)
Potassium: 7.8 mmol/L (ref 3.5–5.1)
Potassium: 6.3 mmol/L (ref 3.5–5.1)
Potassium: 7.2 mmol/L (ref 3.5–5.1)
SODIUM: 138 mmol/L (ref 135–145)
SODIUM: 143 mmol/L (ref 135–145)
Sodium: 143 mmol/L (ref 135–145)

## 2014-12-11 LAB — GLUCOSE, CAPILLARY: GLUCOSE-CAPILLARY: 79 mg/dL (ref 70–99)

## 2014-12-11 LAB — POCT I-STAT 3, ART BLOOD GAS (G3+)
ACID-BASE DEFICIT: 10 mmol/L — AB (ref 0.0–2.0)
BICARBONATE: 16.5 meq/L — AB (ref 20.0–24.0)
O2 Saturation: 94 %
PH ART: 7.253 — AB (ref 7.350–7.450)
PO2 ART: 83 mmHg (ref 80.0–100.0)
Patient temperature: 98.6
TCO2: 18 mmol/L (ref 0–100)
pCO2 arterial: 37.4 mmHg (ref 35.0–45.0)

## 2014-12-11 LAB — CBC
HEMATOCRIT: 34.7 % — AB (ref 39.0–52.0)
HEMOGLOBIN: 11.8 g/dL — AB (ref 13.0–17.0)
MCH: 33.4 pg (ref 26.0–34.0)
MCHC: 34 g/dL (ref 30.0–36.0)
MCV: 98.3 fL (ref 78.0–100.0)
Platelets: 125 10*3/uL — ABNORMAL LOW (ref 150–400)
RBC: 3.53 MIL/uL — ABNORMAL LOW (ref 4.22–5.81)
RDW: 14.4 % (ref 11.5–15.5)
WBC: 9.5 10*3/uL (ref 4.0–10.5)

## 2014-12-11 LAB — TROPONIN I: TROPONIN I: 0.53 ng/mL — AB (ref <0.031)

## 2014-12-11 MED ORDER — SODIUM CHLORIDE 0.9 % IV SOLN
1.0000 g | Freq: Once | INTRAVENOUS | Status: AC
  Administered 2014-12-11: 1 g via INTRAVENOUS
  Filled 2014-12-11: qty 10

## 2014-12-11 MED ORDER — CETYLPYRIDINIUM CHLORIDE 0.05 % MT LIQD
7.0000 mL | Freq: Four times a day (QID) | OROMUCOSAL | Status: DC
  Administered 2014-12-11: 7 mL via OROMUCOSAL

## 2014-12-11 MED ORDER — SODIUM POLYSTYRENE SULFONATE 15 GM/60ML PO SUSP
15.0000 g | Freq: Once | ORAL | Status: AC
  Administered 2014-12-11: 15 g
  Filled 2014-12-11: qty 60

## 2014-12-11 MED ORDER — INSULIN ASPART 100 UNIT/ML IV SOLN
10.0000 [IU] | Freq: Once | INTRAVENOUS | Status: AC
  Administered 2014-12-11: 10 [IU] via INTRAVENOUS

## 2014-12-11 MED ORDER — SODIUM BICARBONATE 8.4 % IV SOLN
50.0000 meq | Freq: Once | INTRAVENOUS | Status: AC
  Administered 2014-12-11: 50 meq via INTRAVENOUS
  Filled 2014-12-11: qty 50

## 2014-12-11 MED ORDER — CHLORHEXIDINE GLUCONATE 0.12 % MT SOLN
15.0000 mL | Freq: Two times a day (BID) | OROMUCOSAL | Status: DC
  Administered 2014-12-11 (×2): 15 mL via OROMUCOSAL
  Filled 2014-12-11 (×2): qty 15

## 2014-12-11 MED ORDER — DEXTROSE 50 % IV SOLN
1.0000 | Freq: Once | INTRAVENOUS | Status: AC
  Administered 2014-12-11: 50 mL via INTRAVENOUS
  Filled 2014-12-11: qty 50

## 2014-12-11 MED FILL — Medication: Qty: 1 | Status: AC

## 2014-12-11 DEATH — deceased

## 2015-01-09 NOTE — Progress Notes (Signed)
CRITICAL VALUE ALERT  Critical value received:  K 6.3  Date of notification:  2015-01-08  Time of notification:  0457  Critical value read back:Yes.    Nurse who received alert:  Hinton Dyer  MD notified (1st page):  Vassie Loll  Time of first page:  [phone]  MD notified (2nd page):  Time of second page:  Responding MD:  Vassie Loll  Time MD responded:  [phone]

## 2015-01-09 NOTE — Progress Notes (Signed)
CRITICAL VALUE ALERT  Critical value received:  Potassium-7.2  Date of notification:  12/27/2014  Time of notification:  0919  Critical value read back:Yes.    Nurse who received alert:  Carlos American  MD notified (1st page):  Sood  Time of first page:  0919  MD notified (2nd page):  Time of second page:  Responding MD:  Craige Cotta  Time MD responded:  [phone]

## 2015-01-09 NOTE — Progress Notes (Signed)
MD notified of patient's increasing hypotension.  Will pause fentanyl infusion and monitor.

## 2015-01-09 NOTE — Progress Notes (Signed)
CRITICAL VALUE ALERT  Critical value received:  K-7.8 Troponin 0.53  Date of notification:  12/31/2014  Time of notification:  0017  Critical value read back:Yes.    Nurse who received alert:  Ernie Hew RN  MD notified (1st page):  Vassie Loll  Time of first page:  0032  MD notified (2nd page):  Time of second page:  Responding MD:  Vassie Loll  Time MD responded:  [phone]

## 2015-01-09 NOTE — Progress Notes (Signed)
Patient noted to be asystole on monitor.  Assessed by 2 RN's.  Patient is a DNR.  MD Dr. Craige Cotta notified.  Son-Tommy called prior to patient expiring and made aware that he needed to come to the hospital that patient was not doing well.  Patient pronounced at [phone].  No heartbeat noted or BP by 2 RN's-Christine Zenaida Tesar and [employer], Charity fundraiser.  Dr. Craige Cotta made aware as well.

## 2015-01-09 NOTE — Discharge Summary (Signed)
Albert Grant was a 74 y.o. male admitted on 12/01/2014 with altered mental status.  He was found to have bradycardia and hyperkalemia with worsening renal function.  He has history of ESRD, but had been refusing outpt dialysis.  Patient however was intubated in the ER.  He was admitted to ICU.  He was then made DNR after further discuss with pt's family.  They did not want to have dialysis started.  He subsequently developed asystole, and expired on 2014/12/14.  Final Diagnoses: Acute on chronic renal failure with stage 5 CKD Hyperkalemia Metabolic acidosis Respiratory acidosis Acute respiratory failure Acute metabolic encephalopathy Hypothermia Diabetes mellitus with renal complications Thrombocytopenia Hx of CAD Hx of chronic combined heart failure Hx of CVA  Coralyn Helling, MD [facility] Pulmonary/Critical Care 14-Dec-2014, 11:40 AM

## 2015-01-09 NOTE — Progress Notes (Signed)
eLink Physician-Brief Progress Note Patient Name: Albert Grant DOB: Apr 19, 1941 MRN: [id number]   Date of Service  25-Dec-2014  HPI/Events of Note  K 7.8  eICU Interventions  Treat with another round of Ca, D50/insulin & kayexalate     Intervention Category Major Interventions: Electrolyte abnormality - evaluation and management  Kinza Gouveia V. Dec 25, 2014, 12:41 AM

## 2015-01-09 DEATH — deceased

## 2015-01-24 IMAGING — CR DG CHEST 1V PORT
2 series · 2 of 2 positions shown · non-contrast
Comparison: DG CHEST 2 VIEW dated 01/16/2014

CLINICAL DATA: Bradycardia, hypotension.

EXAM:
PORTABLE CHEST - 1 VIEW

[AP (1 of 2)]
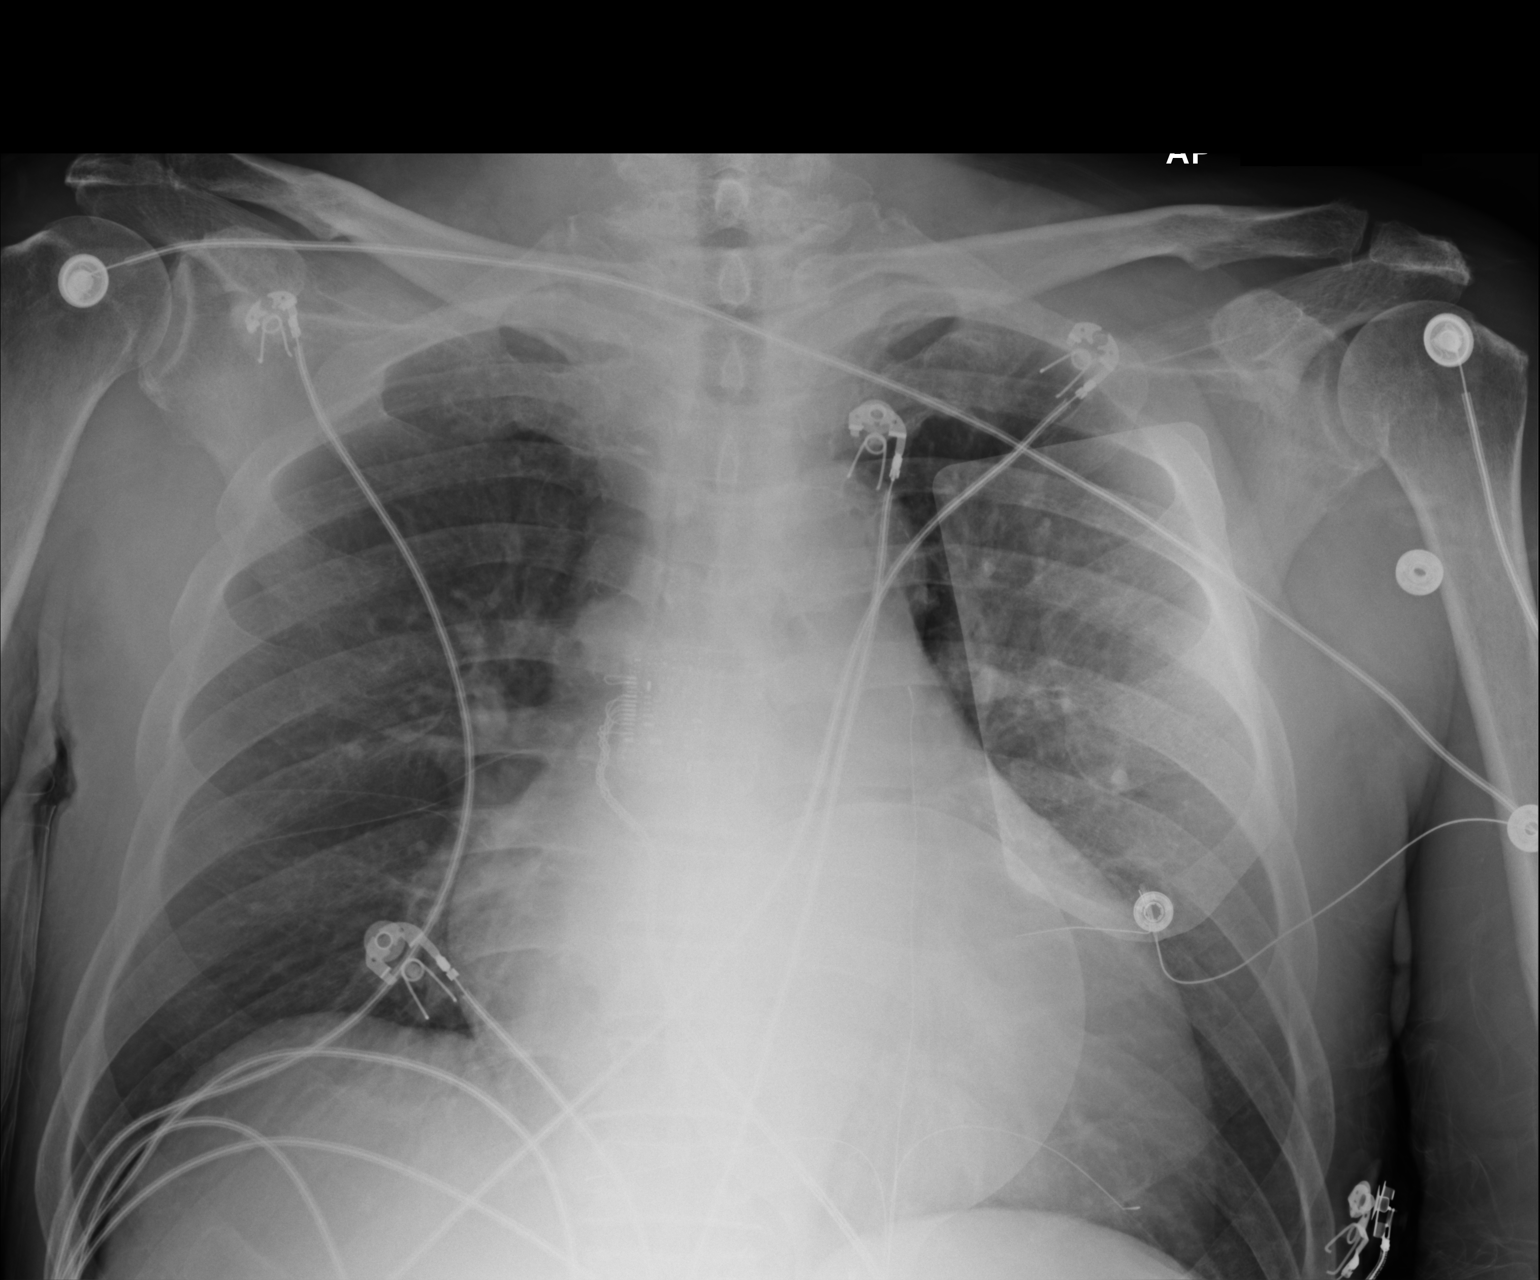

[AP (2 of 2)]
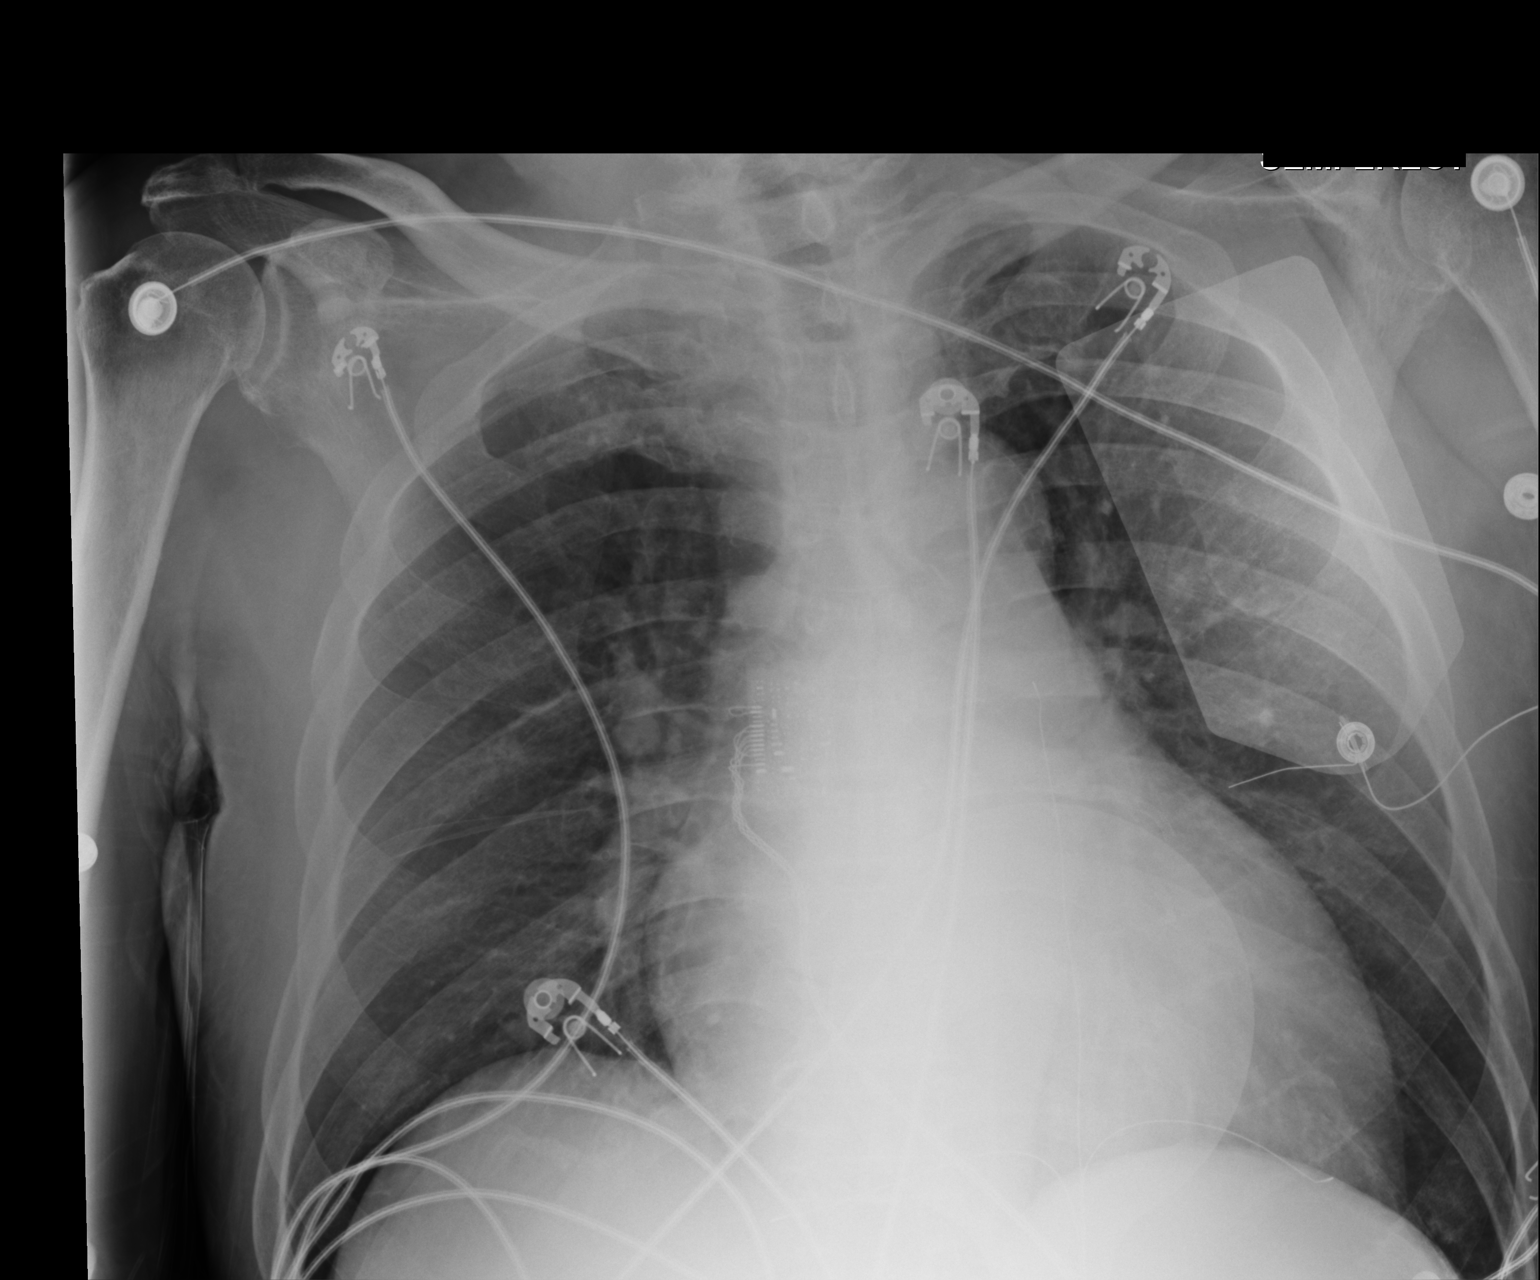

[2 of 2 positions shown; findings below may reference images not displayed]

FINDINGS: The cardiac silhouette appears moderately enlarged, similar. And
mediastinal silhouette is nonsuspicious, mildly calcified. Mild
central pulmonary vasculature congestion with decreased from prior
examination with interval removal of dialysis catheter. No pleural
effusions or focal consolidations though, the costophrenic angles
are incompletely imaged. No pneumothorax.

Symmetric calcifications in the neck are likely vasculature.
Multiple EKG lines overlie the patient and may obscure subtle
underlying pathology. Soft tissue planes and included osseous
structures are nonsuspicious.
IMPRESSION: Stable cardiomegaly, decreased central pulmonary vasculature
congestion. Interval removal of dialysis catheter.

  By: Jhinson Perucho

## 2015-03-02 NOTE — Discharge Summary (Signed)
PATIENT NAME:  Albert Grant, Albert Grant MR#:  [id number] DATE OF BIRTH:  [birth year]  DATE OF ADMISSION:  09/04/2013 DATE OF DISCHARGE:  09/06/2013  ADMITTING PHYSICIAN: Starleen Arms, MD  DISCHARGING PHYSICIAN:  Enid Baas, MD  PRIMARY CARE PHYSICIAN: Serita Sheller. Maryellen Pile, MD at [facility].    CONSULTATIONS IN THE HOSPITAL: Nephrology consultation by Dr. Mady Haagensen.   DISCHARGE DIAGNOSES:  1.  Acute respiratory failure.  2.  Pulmonary edema. 3.  End-stage renal disease, on Tuesday, Thursday, Saturday hemodialysis.  4.  Malignant hypertension.  5.  Type 2 diabetes mellitus.  6.  Hypertension.  7.  Generalized weakness and wheelchair-bound at baseline.   DISCHARGE HOME MEDICATIONS: 1.  Norvasc 10 mg p.o. daily.  2.  Artificial tear drops 1 drop to both eyes every 4 hours as needed.  3.  Aspirin 81 mg p.o. daily.  4.  Atorvastatin 80 mg p.o. at bedtime. 5.  PhosLo 667 mg capsules 1 capsule p.o. b.i.d.  6.  Coreg 25 mg p.o. b.i.d. 7.  Ferrous sulfate 325 mg p.o. 3 times a day.  8.  Guaifenesin 10 mL q.4 hours p.r.n. for cough.  9.  Hydralazine 50 mg p.o. 3 times a day.  10.  Imdur 60 mg p.o. q.a.m.  11.  Lisinopril 40 mg p.o. daily.  12.  Sublingual nitroglycerin 0.4 mg every 5 minutes as needed for chest pain.  13.  Zofran 4 mg oral disintegrating tablet q.8 hours p.r.n. for nausea and vomiting.  14.  Protonix 40 mg p.o. b.i.d.  15.  Plavix 75 mg p.o. daily.  16.  Glargine insulin 3 units subcutaneous at bedtime.  17.  NovoLog 100 units/mL subcutaneous solution, 2 units prior to meals 3 times a day.  18.  Renal capsules with vitamin B complex and C and folic acid, 1 capsule p.o. daily.  19.  Sertraline 100 mg p.o. daily.  20.  Sevelamer 800 mg, 2 tablets 3 times a day.  21.  Tylenol 650 mg q.4 hours p.r.n. for pain or fever.  22.  Senna 1 tablet p.o. b.i.d. p.r.n. for constipation.  23.  Colace 1 capsule, 100 mg b.i.d. p.r.n. for constipation.   DISCHARGE DIET: Renal  diet.   DISCHARGE ACTIVITY: As tolerated.    FOLLOWUP INSTRUCTIONS: 1.  Follow up with nephrology for next dialysis on 09/08/2013.  2.  PCP followup in 1 to 2 weeks.  3.  Physical therapy.   LABS AND IMAGING STUDIES PRIOR TO DISCHARGE:  1.  Sodium 133, potassium 4.1, chloride 99, bicarb 30, BUN 27, creatinine 3.3, glucose 120 and calcium of 8.0, phosphorus 2.6.  2.  HbA1c is 5.9.  3.  WBC 3.9, hemoglobin 9.2, hematocrit 27.0, platelet count 199.  4.  Troponin is elevated at 0.76 and 0.65.  5.  Hepatitis B surface antigen is negative.  6.  Blood cultures remain negative since admission.  7.  Hepatitis B surface antibody is positive and core antibody is also positive.  8.  Chest x-ray on admission revealing interstitial infiltrates consistent with pulmonary edema.   HOSPITAL COURSE: Mr. Stucki is a 74 year old [place] male with past medical history significant for hypertension, generalized weakness, wheelchair-bound at baseline, depression and end-stage renal disease on Tuesday, Thursday, Saturday hemodialysis who had recent hospitalization at [facility] and was discharged to [facility], was brought back secondary to shortness of breath, chest pain and was noted to be in pulmonary edema.  1.  Acute respiratory failure secondary to pulmonary edema. The patient  has had history of noncompliance with dialysis and requesting to get shorter treatments at dialysis. He missed his dialysis on 09/03/2013 and was noted to have shortness of breath with complaints of chest pain and was brought to the ER. He was placed on BiPAP, admitted to the telemetry floor and had immediate dialysis on 09/04/2013 and repeat dialysis sessions on 09/05/2013 and 09/06/2013. His breathing has improved. His chest pain has resolved. He does have elevated troponin which is likely secondary to his end-stage renal disease and demand ischemia from his respiratory issues but no evidence of any myocardial infarction.  His last dialysis was prior to discharge on 09/06/2013 and the patient will continue his dialysis per schedule on a Tuesday, Thursday, Saturday basis. 2.  Malignant hypertension. Blood pressure was elevated on admission, however, improved with dialysis and he is also on Norvasc, Imdur, lisinopril, hydralazine and Coreg. All medications were continued.   3.  His course has been otherwise uneventful in the hospital.   DISCHARGE CONDITION: Stable.   DISCHARGE DISPOSITION: To [facility].   TIME SPENT ON DISCHARGE: 40 minutes.   ____________________________ Enid Baas, MD rk:cs D: 09/06/2013 14:20:00 ET T: 09/06/2013 14:45:00 ET JOB#: [id number]  cc: Serita Sheller. Maryellen Pile, MD Enid Baas, MD, <Dictator>  Enid Baas MD ELECTRONICALLY SIGNED 09/20/2013 13:54

## 2015-03-02 NOTE — H&P (Signed)
PATIENT NAME:  Albert Grant, Albert Grant MR#:  [id number] DATE OF BIRTH:  [birth year]  DATE OF ADMISSION:  09/04/2013  ADDENDUM   ALLERGIES: No known drug allergies.   HOME MEDICATIONS:  1. Norvasc 10 mg oral daily.  2. Artificial  tears as needed.  3. Aspirin 81 mg daily.  4. Atorvastatin 80 mg at bedtime.  5. Calcium acetate 667 mg 1 capsule 2 times a day.  6. Coreg 25 mg p.o. t.i.d.  7. Ferrous sulfate 325 mg oral 3 times a day.  8. Hydralazine 50 mg oral 3 times a day.  9. Insulin glargine 3 units subcutaneous at bedtime.  10. Imdur extended release 60 mg oral daily.  11. Lisinopril 40 mg oral daily.  12. imdur  extended release 60 mg oral daily.  13. Sublingual nitroglycerin as needed.  14. NovoLog 2 units subcutaneous before meals.  15. Zofran as needed.  16. Pantoprazole 40 mg oral daily.  17. Plavix 75 mg daily.  19. Sertraline 100 mg oral daily.  20. Selvamer  800 mg oral with meals.  21. Tylenol as needed.    ____________ Starleen Arms, MD dse:sg D: 09/04/2013 08:03:47 ET T: 09/04/2013 11:33:31 ET JOB#: [id number]  cc: Starleen Arms, MD, <Dictator> Carmellia Kreisler Teena Irani MD ELECTRONICALLY SIGNED 09/05/2013 0:24

## 2015-03-02 NOTE — H&P (Signed)
PATIENT NAME:  Albert Grant, Albert Grant MR#:  [id number] DATE OF BIRTH:  1941/02/21  DATE OF ADMISSION:  09/04/2013  REFERRING PHYSICIAN: Dr. Chiquita Loth.  PRIMARY CARE PHYSICIAN: Dr. Toy Cookey.  CHIEF COMPLAINT: Chest pain, shortness of breath, uncontrolled blood pressure.   HISTORY OF PRESENT ILLNESS: This is a 74 year old male with significant past medical history of end-stage renal disease on hemodialysis Tuesday, Thursday, Saturday, coronary artery disease, hypertension, depression, hyperlipidemia, weakness, and debilitation. Sent from nursing home for multiple complaints, mainly uncontrolled blood pressure, chest pain and shortness of breath. This is the patient's first visit here. The patient is currently lethargic as he received IV morphine, so could not obtain much history or review of systems from the patient. Most of the information was obtained from ED staff and previous documentation. It appears the patient has missed his hemodialysis on Saturday, as well, he was leaving his hemodialysis early over the last 2 sessions,  due to noncompliance, where he developed shortness of breath and chest pain overnight. The patient upon presentation was in severe respiratory distress requiring BiPAP, as well with significantly elevated blood pressure. On presentation, his blood pressure was 212/112. He received IV morphine 4 mg, and nitro paste as well. After receiving the morphine, the patient has been lethargic, has not been able to provide much history or review of systems.   The patient's first troponin was mildly elevated at 0.07. He has mild leukocytosis at 14.6, but he is afebrile, his hemoglobin is 10.9. He had an ABG done which did show pH of 7.37 and pCO2 of 47 and pO2 of 77 on BiPAP. Currently the patient is saturating well on BiPAP, the patient's EKG does not show any significant ST or T wave changes; there is no baseline to compare as well. The only abnormality is T wave inversion in aVL. Hospitalist  service was requested to admit the patient for further management and treatment of his acute respiratory failure and volume overload.   PAST MEDICAL HISTORY: 1. Generalized muscle weakness.  2. End-stage renal disease. 3. Renal artery disease.  4. Depression.  5. Type 2 diabetes, insulin-requiring.  6. Hypertension.   PAST SURGICAL HISTORY: Cannot be obtained due to patient's mentation.   SOCIAL HISTORY: Cannot be obtained due to patient's lethargy.   FAMILY HISTORY: Cannot be obtained due to the patient's lethargy.   REVIEW OF SYSTEMS: Cannot be obtained due to his lethargy.   PHYSICAL EXAMINATION:  VITAL SIGNS: Temperature 97.7, pulse 85. Respiratory rate 18, blood pressure 167/80, saturating 100%  on BiPAP.  GENERAL: Elderly male, lies comfortable in bed, in no apparent distress.  HEENT: Head atraumatic, normocephalic. Pupils equally reactive to light. Pink conjunctivae. Anicteric sclerae. Has facial puffing due to edema on the facial BiPAP mask.  NECK: Supple. No thyromegaly. Has +10 cm JVD.  CHEST: The patient had fair air entry bilaterally. No wheezing. No rales. No rhonchi. Has bibasilar crackles.  CARDIOVASCULAR: S1, S2 heard. No rubs, murmurs, or gallops.  ABDOMEN: Soft, nontender, nondistended. Bowel sounds present.  EXTREMITIES: Mild edema bilaterally. No clubbing. No cyanosis. Pedal pulses felt bilaterally.  PSYCHIATRIC: The patient is lethargic, unable to give any information. Answering a few  questions occasionally. NEUROLOGICAL:  Unable to assess appropriately secondary to his lethargy, but appears to be moving upper extremities without significant deficits and as well moving his lower extremities  minimally.  LYMPHATICS: No cervical, supraclavicular lymphadenopathy.  SKIN: Normal skin turgor. Warm and dry.  CHEST: The patient has right chest PermaCath.  PERTINENT LABS: Glucose 210, BUN 40, creatinine 4.85, sodium 134, potassium 3.9, chloride 99, CO2 of 27. ALT 16,  AST 30, alkaline phosphatase 183. Troponin 0.07, CK-MB 3.1, total CK 127. White blood cells 14.6, hemoglobin 10.9, hematocrit 32.3, platelet 274, pH 7.37, pCO2 of 47, pO2 of 77 on BiPAP.   Chest x-ray showing vascular  congestion and volume overload. Still awaiting official reading by Radiology.   ASSESSMENT AND PLAN: 1. Acute respiratory failure, this appears to be a combination most likely due to volume overload and pulmonary edema from patient missing his hemodialysis. Currently, he is tolerating BiPAP well, consulted Nephrology. Discussed with Nephrology, the patient will have emergent hemodialysis.  2. Chest pain and mild elevated troponins, this is most likely due to demand ischemia from his respiratory failure and hypertensive urgency. We will continue to cycle cardiac enzymes and follow the trend. The patient will be admitted to telemetry. We will give him aspirin 300 mg PR  x1.  3. Hypertension. Initially uncontrolled. Currently much improved after the nitro paste. We will continue him on his home medications. We will add p.r.n. hydralazine for him.  4. Coronary artery disease. The patient is on aspirin, Plavix, ACE inhibitor, statin and beta blockers.  5. Hyperlipidemia. Continue with statin.  6. End-stage renal disease. Nephrology  consulted to continue with hemodialysis. We will continue the patient on calcium acetate and selvamer capsules.  7. Diabetes mellitus, secondary to the patient lethargic altered mental status. We will hold his long-acting insulin. We will have him only on insulin sliding scale.  8.  Depression. We will continue him on sertraline.  9.  Deep vein thrombosis prophylaxis. Subcutaneous heparin.  10. CODE STATUS: I tried to call the patient's contacts on our records. There is no one answering. Actually his first contact information appears to be a hospital for phone number, [facility], tried to contact other numbers with no one answering.  This patient  will be kept full code as per nursing home documentation.   TOTAL TIME SPENT ON ADMISSION AND PATIENT CARE: 60 minutes.    ____________________________ Starleen Arms, MD dse:sg D: 09/04/2013 07:58:43 ET T: 09/04/2013 11:08:20 ET JOB#: [id number]  cc: Starleen Arms, MD, <Dictator> Lelend Heinecke Teena Irani MD ELECTRONICALLY SIGNED 09/05/2013 0:25

## 2015-03-03 NOTE — Discharge Summary (Signed)
PATIENT NAME:  Albert Grant, Albert Grant MR#:  [id number] DATE OF BIRTH:  [birth year]  DATE OF ADMISSION:  02/23/2014 DATE OF DISCHARGE:  02/28/2014   REASON FOR ADMISSION: High fevers, altered mental status.   DISCHARGE DIAGNOSES:  1. Altered mental status due to her metabolic encephalopathy secondary to sepsis.  2. Sepsis, catheter-related, with Pseudomonas bacteremia.  3. Malignant hypertension.  4. Coronary artery disease.  5. Type 2 insulin-dependent diabetes. 6. Hyperlipidemia.  7. End-stage renal disease.   MEDICATIONS AT DISCHARGE: Per med MAR. Please note that the patient is going to be on ceftazidime 2 grams intravenously 3 times a week with dialysis on Monday, Wednesday, Friday now for a total of 14 days. His stop date is 03/11/2014.   DISPOSITION: Skilled nursing facility.   HISTORY OF PRESENT ILLNESS AND HOSPITAL COURSE: This is a very nice 74 year old [place] gentleman who has a history of end-stage renal disease, diabetes, hypertension, hyperlipidemia and coronary artery disease, who went to dialysis on the date of admission, 02/23/2014, and he was found to have a fever up to 102.5 and elevation of blood pressure through dialysis. The patient was very agitated and confused. His heart rate was around 100, his temperature was 102.5, and he was admitted to the hospitalist service with a possible diagnosis of UTI, sepsis. The patient actually had contamination of the vascular catheter which was located on his right IJ. The patient was noticed to have purulent secretions coming out, for which the catheter was removed by Dr. Gilda Crease on 02/24/2014. After that, the patient remained off dialysis for a couple of days, and on Monday, 02/27/2014, dialysis was done back again using his previous fistula that apparently was not working properly. Right now, it is working properly. Dr. Gilda Crease said that he would like to get vascular study at some point outpatient to evaluate if there is any need for dilation or  for procedures to the fistula, for which he needs a followup with Dr. Gilda Crease in the next 1 to 2 weeks. As far as his sepsis, it was secondary to Pseudomonas, and this is catheter-related. The Pseudomonas was pansensitive, for which we changed antibiotics to ceftazidime, which could be given during the dialysis treatments. The patient is to complete this antibiotic for 2 weeks. Since he was bacteremic, we did an echocardiogram to evaluate for vegetations, and it was negative. The patient actually improved with his mental status within 24 hours.   As per problems:  1. Metabolic encephalopathy was secondary to sepsis, and the patient, as I said, improved. We communicated with the use of interpreter.  2. Sepsis secondary to Pseudomonas with Pseudomonas bacteremia. The patient on ceftazidime 2 grams before every dialysis. Blood cultures repeated 2 days after the original blood cultures were negative.  3. As far as malignant hypertension, the patient is refusing all his medications. His blood pressure goes up to 200s. I am going to put him on a clonidine patch to facilitate the care of his blood pressure. 4. Coronary artery disease. Continue aspirin, Plavix, Coreg, lisinopril, Imdur and statins.  5. Hyperlipidemia. The patient was taking statins.  6. End-stage renal disease. The patient was taking dialysis on Tuesday, Thursday, Saturday. Right now, the schedule has been temporally changed to Monday, Wednesday, Friday, and this is due to the antibiotics.  7. As far as his diabetes, the patient had occasional elevation of his blood sugars up to 400s, then down to the 50s, and this was due to the patient's diet habits.  8. As far as  GI, he did not have any problems.   CODE STATUS: The patient is still full code.   TIME SPENT: I spent about 50 minutes with this discharge today.  ____________________________ Felipa Furnace, MD rsg:lb D: 02/28/2014 12:25:13 ET T: 02/28/2014 13:05:54  ET JOB#: [id number]  cc: Felipa Furnace, MD, <Dictator> Renford Dills, MD Munsoor Lizabeth Leyden, MD Mosetta Pigeon, MD Serita Sheller Maryellen Pile, MD Regan Rakers Juanda Chance MD ELECTRONICALLY SIGNED 03/14/2014 22:59

## 2015-03-03 NOTE — H&P (Signed)
PATIENT NAME:  Albert Grant, Albert Grant MR#:  [id number] DATE OF BIRTH:  25-Jul-1941  DATE OF ADMISSION:  09/16/2014  PRIMARY CARE PHYSICIAN: Alden Server B. Maryellen Pile, MD.   NEPHROLOGY: Laverda Sorenson, MD and Munsoor Lizabeth Leyden, MD.  CHIEF COMPLAINT: Chest pain.   HISTORY OF PRESENT ILLNESS: The patient is a poor historian. No family members present. History is obtained from ER physician and old records.   Albert Grant is a 74 year old [place] male who does not speak much English; however, does understand some broken English, has history of end-stage renal disease on hemodialysis, hypertension, hyperlipidemia, and dementia. He comes to the Emergency Room after he had some chest pain earlier during dialysis. Not sure whether his dialysis is completed. During my evaluation, the patient is irritated and upset because he has been hungry for the last 4 to 5 hours. He is alert, awake, and follows commands. He is currently chest pain-free. Denies any shortness of breath. He is being admitted with a mild elevated troponin. He was started on IV heparin drip by Dr. Scotty Court in the Emergency Room.   PAST MEDICAL HISTORY: 1.  History of end-stage renal disease, on hemodialysis.  2.  Depression.  3.  History of dementia.  4.  History of coronary artery disease.  5.  Hypertension.  6.  Type 2 diabetes.   PAST SURGICAL HISTORY: Unknown.   SOCIAL HISTORY: He is a resident of a nursing home.   CODE STATUS: Full code.   ALLERGIES: No known drug allergies.   FAMILY HISTORY: Unobtainable at this time.   REVIEW OF SYSTEMS: Very limited secondary to patient understanding; however, denies any chest pain or shortness of breath. No fever. Positive for fatigue and weakness.  EYES: No blurred or double vision or glaucoma.  RESPIRATORY: No shortness of breath.  CARDIOVASCULAR: No chest pain at present, positive hypertension.  GASTROINTESTINAL: No nausea, abdominal pain or vomiting.  The rest of the review of systems I tried to  obtain; however, the patient is irritated and upset because he is hungry and does not want to answer my questions.   CURRENT MEDICATIONS: 1.  Vitamin B complex 100 mg p.o. daily.  2.  Simethicone 80 mg 4 times a day.  3.  Sertraline 100 mg daily.  4.  Senokot 1 p.o. daily.  5.  Phoslyra 15 mL 3 times a day after meals.  6.  Protonix 40 mg b.i.d.  7.  Zofran 4 mg every 8 hours as needed.  8.  NovoLog sliding scale.  9.  Nitrostat 0.4 mg sublingual as needed.  10.   Nepro liquid 1 can orally daily.  11.   Remeron 15 mL once a day at bedtime.  12.   Lorazepam apply 1 mg to affected area b.i.d. for anxiety, agitation.  13.   Lantus 12 units once a day at bedtime.  14.   Imdur 60 mg extended release p.o. daily.  15.   Hydroxyzine pamoate 1 capsule 4 times a day as needed.  16.   Hydralazine 50 mg 3 times a day.  17.   Ferrous sulfate 325 mg 1 tablet 3 times a day.  18.   Plavix 75 mg daily.  19.   Coreg 25 mg b.i.d.  20.   Calcium acetate 667, 1 capsule b.i.d.  21.   Atorvastatin 80 mg p.o. daily at bedtime.  22.   Aspirin 81 mg daily.  23.   Amlodipine 10 mg daily.   PHYSICAL EXAMINATION: GENERAL: The patient is awake,  alert, oriented x 2. Afebrile. Pulse is 86, blood pressure 182/74, sats 98% on 2 liters.  HEENT: Atraumatic, normocephalic. PERLA. EOMI. Oral mucosa is moist.  NECK: Supple. No JVD. No carotid bruit.  RESPIRATORY: Clear to auscultation. Decreased breath sounds at the bases. A few bibasilar rales present.  CARDIOVASCULAR: Both the heart sounds are normal. Rate, rhythm regular. PMI not lateralized. Chest nontender. Good pedal pulses, good femoral pulses. No lower extremity edema.  ABDOMEN: Soft, benign, nontender. No organomegaly. Positive bowel sounds.  NEUROLOGIC: Grossly intact cranial nerves II through XII. No motor or sensory deficits.  PSYCHIATRIC: The patient is awake, alert.   DIAGNOSTIC DATA: EKG shows normal sinus rhythm with left atrial enlargement, LVH with  repolarization. No acute ST changes. Chest x-ray: No cardiopulmonary disease.   LABORATORY DATA: Troponin 0.17, PT/INR is 13.8 and 1.1. White count is 10.1, H and H of 12.3 and 38.1, sodium is 133, potassium is 3.7.   ASSESSMENT AND PLAN: A 74 year old, Albert Grant, with history of end-stage renal disease on hemodialysis, along with coronary artery disease, history of hypertension and diabetes, comes in with:  1.  Chest pain with elevated troponin. The patient during my evaluation denies any chest pain or shortness of breath. He is going to be admitted on the telemetry floor. Heparin drip will be continued for heparin nomogram. It was started by ER physician. Cardiology consultation. Cycle cardiac enzymes x 3, continue aspirin and Plavix. Further workup per cardiology recommendation.   2.  End-stage renal disease. Will consult nephrology for inpatient hemodialysis.  3.  Hypertension. Continue his Coreg and amlodipine. P.r.n. hydralazine as needed.  4.  Type 2 diabetes. We will place him on sliding scale insulin and Lantus.  5.  Hyperlipidemia, on atorvastatin.  6.  Deep vein thrombosis prophylaxis, already on heparin drip.  7.  Child psychotherapist for discharge planning.   Above was discussed with the patient, no family members present.   TIME SPENT: 50 minutes.   ____________________________ Wylie Hail Allena Katz, MD sap:at D: 09/16/2014 17:21:40 ET T: 09/16/2014 17:43:48 ET JOB#: [id number]  cc: Katheryne Gorr A. Allena Katz, MD, <Dictator> Serita Sheller. Maryellen Pile, MD Laverda Sorenson, MD Munsoor Lizabeth Leyden, MD  Willow Ora MD ELECTRONICALLY SIGNED 09/28/2014 11:50

## 2015-03-03 NOTE — Consult Note (Signed)
Brief Consult Note: Diagnosis: catheter related sepsis.   Patient was seen by consultant.   Comments: will proceed with removal of catheter and culture of the tip.  Electronic Signatures: Levora Dredge (MD)  (Signed 17-Apr-15 19:34)  Authored: Brief Consult Note   Last Updated: 17-Apr-15 19:34 by Levora Dredge (MD)

## 2015-03-03 NOTE — H&P (Signed)
PATIENT NAME:  Albert Grant, Albert Grant MR#:  [id number] DATE OF BIRTH:  01/12/1941  DATE OF ADMISSION:  02/23/2014  PRIMARY CARE PROVIDER: Dr. Brynda Greathouse  CHIEF COMPLAINT: Fever, elevated blood pressure.   HISTORY OF PRESENT ILLNESS: This is a 74 year old [place] male patient who does not speak English with history of endstage renal disease on hemodialysis, hypertension, hyperlipidemia, and dementia who presents to the Emergency Room sent in from dialysis center after they found that he had fever and elevated blood pressure half way through his dialysis. Here in the Emergency Room the patient was alert but agitated at times. He was noticed to have fever of 102.5 and heart rate 100 and is being admitted to the hospitalist service for UTI sepsis. The patient at baseline seems to be confused and agitated at times at the nursing home, but presently is not speaking. History taking was attempted through an interpreter on the phone in the Emergency Room, but was unsuccessful as the patient is not speaking, although he is alert, awake, and follows commands.   The patient's chest x-ray does not show any infiltrate. Urinalysis has 3+ bacteria and 11 WBC. The patient does not seem to have any concerns at this time.   PAST MEDICAL HISTORY: 1.  End-stage renal disease on hemodialysis.  2.  Depression.  3.  Type 2 diabetes mellitus.  4.  Hypertension.  5.  Dementia.  6.  Baseline confusion.   PAST SURGICAL HISTORY: Unknown.   SOCIAL HISTORY: The patient is a resident of a nursing home. Unknown if he smokes or drinks alcohol.   CODE STATUS: FULL.  FAMILY HISTORY: Cannot be obtained secondary to the patient's encephalopathy.   REVIEW OF SYSTEMS: Unable to obtain.  HOME MEDICATIONS: 1.  Acetaminophen/oxycodone 5/325 mg 1 tablet every 6 hours as needed for pain.  2.  Atorvastatin 80 mg daily.  3.  Calcium acetate 667 mg 3 capsules oral 3 times a day with meals.  4.  Coreg 25 mg oral 2 times a day.  5.  Plavix 75 mg  daily.  6.  Ferrous sulfate 325 mg 3 times a day.  7.  Hydralazine 25 mg 3 tablets 3 times a day.  8.  Isosorbide mononitrate 60 mg oral once a day.  9.  Lantus 12 units subcutaneous once a day at bedtime.  10.  Lorazepam 1 mg topically to effected area twice a day for anxiety.  11.  Nitrostat 0.4 sublingual as needed for chest pain.  12.  NovoLog subcutaneous 3 times a day with meals.  13.  Zofran 4 mg oral 3 times a day as needed for nausea and vomiting.  14.  Protonix 40 mg 2 times a day.  15.  Sertraline 100 mg oral once a day.  16.  Multivitamin 1 tablet daily.   ALLERGIES: No known drug allergies.   PHYSICAL EXAMINATION: VITAL SIGNS: Temperature 102.5, pulse 100, blood pressure 153/67, saturating 95% on room air.  GENERAL: Moderately built male patient lying in bed, seems confused but calm.  HEENT: Atraumatic, normocephalic. Oral mucosa dry and pink. Pallor positive. No icterus. Pupils bilaterally equal and reactive to light.  NECK: Supple. No thyromegaly. No palpable lymph nodes. Trachea midline. No bruit or JVD.  PSYCHIATRIC: Alert and awake but seems confused, restless.  CARDIOVASCULAR: S1 and S2 tachycardic without any murmurs. Peripheral pulses 2+. No edema.  RESPIRATORY: Normal work of breathing. Clear to auscultation on both sides.  GASTROINTESTINAL: Soft abdomen, nontender. Bowel sounds present. No hepatosplenomegaly palpable.  GENITOURINARY:  No CVA tenderness, bladder distention.  SKIN: Warm and dry. No petechiae, rash, ulcers.  MUSCULOSKELETAL: No joint swelling, redness or effusion of the large joints. Normal muscle tone.  NEUROLOGIC: Motor strength 5/5 in upper and lower extremities. Babinski's downgoing.   DIAGNOSTIC DATA: Laboratory studies show glucose 120, BUN 59, creatinine 6.47, sodium 133, potassium 4.6. AST, ALT, alk phos, and bilirubin normal. Troponin 0.04.   WBC 5.4, hemoglobin 13.8, platelets 234,000. INR and PTT normal. Urinalysis shows 3+ bacteria and 11  WBC.   ABG shows pH of 7.48, pCO2 29, and pO2 64 on room air.   EKG shows atrial fibrillation and LVH. No ST elevation found.   CT scan of the head without contrast shows no acute abnormality. Shows atrophy.   Chest x-ray shows dialysis catheter in place, but no active disease.   ASSESSMENT AND PLAN: 1.  Urinary tract infection with sepsis and acute encephalopathy. We will start the patient on ceftriaxone, get urine and blood cultures. Considering the patient's tachycardia and fever, we will give him a liter of IV fluids and stop as he is on dialysis. Does not look fluid overload at this time. Further management per culture results. The patient does seem to have baseline dementia and confusion. It is unclear how far the patient is from his baseline, is not speaking, but is awake and alert at this time. Will continue his medication of Seroquel.  2.  End-stage renal disease. On hemodialysis. Consult nephrology for continuation of dialysis while the patient is in the hospital.  3.  Accelerated hypertension. Blood pressure is improved. This was likely secondary from the agitation the patient had earlier. Will continue his home medications. Use IV p.r.n. medications as needed.  4.  Diabetes mellitus type 2. Continue the patient's sliding scale. Will hold the Levemir as the patient is confused and I doubt if he can take his full regular diet.  5.  Deep vein thrombosis prophylaxis with heparin.  6. Afib rate controlled. Likely from acute illness. Check TSH. Anticoagulation and furtehr w/u if doesnt resolve with fever/sepsis.  CODE STATUS: FULL.   TIME SPENT: Today on this case was 50 minutes.  ____________________________ Leia Alf Laurieann Friddle, MD srs:sb D: 02/23/2014 16:50:51 ET T: 02/23/2014 17:07:01 ET JOB#: [id number]  cc: Alveta Heimlich R. Roald Lukacs, MD, <Dictator> Mikeal Hawthorne. Brynda Greathouse, MD Neita Carp MD ELECTRONICALLY SIGNED 02/24/2014 11:21

## 2015-03-03 NOTE — Discharge Summary (Signed)
PATIENT NAME:  FEMI, WEBB MR#:  [id number] DATE OF BIRTH:  1941/02/01  DATE OF ADMISSION:  09/16/2014 DATE OF DISCHARGE:  09/17/2014  The patient is going to be discharged to his [facility] in [place].   CHIEF COMPLAINT: Chest pain.   DISCHARGE DIAGNOSES:  1.  Chest pain, appears atypical, resolved. No medical management recommended.  2.  End-stage renal disease, on hemodialysis on Tuesday, Thursday and Saturday.  3.  Type 2 diabetes.   CODE STATUS: Full code.   MEDICATIONS:  1.  Atorvastatin 80 mg at bedtime.  2.  Ferrous sulfate 325 p.o. t.i.d.  3.  NovoLog 2 units subcutaneous 3 times a day before meals.  4.  Imdur 60 mg extended release p.o. daily.  5.  Plavix 75 mg daily.  6.  Lantus 12 units at bedtime.  7.  Nitrostat 0.4 mg as needed sublingual.  8.  Zofran 4 mg p.o. every 8 hours as needed.  9.  Lorazepam topical application as before.  10. Aspirin 81 mg daily.  11. Amlodipine 10 mg daily.  12. Phoslyra 15 mL 3 times a day.  13. Coreg 25 mg b.i.d.  14. Protonix 40 mg b.i.d.  15. Hydralazine 50 mg t.i.d.  16. Remeron 15 mg at bedtime.  17. Hydroxyzine 25 mg 1 capsule 4 times a day as needed.  18. Senokot 1 tablet daily.  19. Simethicone 80 mg p.o. 4 times a day.  20. Vitamin B complex 100 mg daily.  21. Sertraline 100 mg daily.  22. Nepro liquid one can daily.  PLAN:  1. Carbohydrate controlled/renal diet.  2. Follow up with your primary care physician, Dr.  Maryellen Pile, in 1 to 2 weeks.  3. Follow up with Dr. Adrian Blackwater, cardiology, in 1 to 2 weeks.  4. Resume hemodialysis on Tuesday, Thursday and Saturday as before.   LABORATORY DATA: Troponin is 0.18, 0.16, 0.17. Hemoglobin and hematocrit 11.3 and 35.6. Potassium is 3.7.   CONSULTATION:  1.  Cardiology consultation with Dr. Adrian Blackwater who recommends medical management.  2.  Renal consultation with Dr. Wynelle Link who recommends resuming hemodialysis starting Tuesday on routine  schedule. Marland Kitchen  BRIEF SUMMARY OF HOSPITAL COURSE:  Mr. Plott is a 74 year old [place] gentleman with a history of end-stage renal disease, on hemodialysis, along with a history of coronary artery disease who comes into the Emergency Room after he started having some chest tightness when he was at dialysis yesterday. He comes in with:  1.  Chest pain with mild elevated troponin. His troponins were 0.17, 0.16 and 0.18. The chest pain resolved during my evaluation, and the patient denied any shortness of breath. A heparin drip was started by the ER. The patient remained on a heparin drip for 24 hours. No further chest pain or any EKG changes. Cardiology consultation was obtained with Dr. Welton Flakes who spoke with him and recommends conservative management. Continue aspirin and Plavix. Follow up as an outpatient.  2.  End-stage renal disease. Per Dr. Wynelle Link, resume hemodialysis on Tuesday, Thursday, Saturday.  3.  Hypertension. Continue his Coreg, amlodipine and hydralazine.  4.  Type 2 diabetes, on insulin Lantus along with his regular insulin.  5.  Hyperlipidemia, on atorvastatin.   Overall, the patient is stable. I was unable to reach any family over the phone although discussion was held while using an interpreter on the phone. The patient voiced understanding and is requesting to go back to his facility. I spoke with a social  worker who will make arrangements for the patient to be discharged to his facility in [place]. The patient remained a full code.   TIME SPENT: 30 minutes   ____________________________ Jearl Klinefelter A. Allena Katz, MD sap:JT D: 09/17/2014 13:07:39 ET T: 09/17/2014 13:52:18 ET JOB#: [id number]  cc: Julyana Woolverton A. Allena Katz, MD, <Dictator> Laurier Nancy, MD Laverda Sorenson, MD Serita Sheller Maryellen Pile, MD  Willow Ora MD ELECTRONICALLY SIGNED 09/28/2014 11:50

## 2015-03-03 NOTE — Consult Note (Signed)
PATIENT NAME:  Albert Grant, Albert Grant MR#:  [id number] DATE OF BIRTH:  [birth year]  DATE OF CONSULTATION:  09/17/2014  REFERRING PHYSICIAN:   CONSULTING PHYSICIAN:  Alinda Sierras. Bentlee Drier, PA-C  INDICATION FOR CONSULT: Chest pain and elevated troponin.   HISTORY OF PRESENT ILLNESS: This is a 74 year old [place] male who does not speak very much English who had some chest pain yesterday during dialysis. In the ER, he was found to have a mildly elevated troponin and was started on IV heparin drip. Thus, cardiology was consulted. Today, he states he feels good. Denies chest pain, shortness of breath or abdominal  pain.   PAST MEDICAL HISTORY: End-stage renal disease, on hemodialysis; history of coronary artery disease; hypertension; type 2 diabetes; depression; and history of dementia.   SOCIAL HISTORY: He lives in a nursing home.   ALLERGIES: No known drug allergies.   FAMILY HISTORY: Unable to obtain.   REVIEW OF SYSTEMS: Very hard to obtain secondary to language barrier and the patient being unresponsive, but he denies chest pain, shortness of breath, abdominal pain and states he feels well.   PHYSICAL EXAMINATION:  VITAL SIGNS: :98.6, pulse 70, respirations 20, blood pressure 162/74, 98% saturation on room air.  GENERAL: Awake briefly. Then the patient was snoring.  HEENT: No JVD or carotid bruit.  RESPIRATORY: Clear to auscultation bilaterally. No wheezes, rhonchi or rales.  CARDIOVASCULAR: Normal S1, S2. No audible murmur.  ABDOMEN: Soft, nontender. Positive bowel sounds.   LABORATORY DATA: Creatinine 8.7. Troponin 0.17, 0.16, 0.18. WBC 9.1.   EKG shows a normal sinus rhythm, 85 beats per minute, left atrial enlargement, LVH and QTc of 518.   ASSESSMENT AND PLAN: Chest pain with elevated troponin. We will obtain an echocardiogram to evaluate wall motion and valves. Chest pain has resolved. Advise continuing aspirin, Plavix, beta blocker and amlodipine. The patient is not a candidate for  catheterization, and we will do conservative management.   Thank you very much for this consultation.    ____________________________ Alinda Sierras Jarold Motto, PA-C ear:JT D: 09/17/2014 12:45:04 ET T: 09/17/2014 13:07:21 ET JOB#: [id number]  cc: Marjean Donna A. Margarito Courser, <Dictator> Alinda Sierras Annina Piotrowski PA ELECTRONICALLY SIGNED 09/20/2014 15:31

## 2015-03-03 NOTE — Consult Note (Signed)
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  In the Emergency Room the patient was alert but agitated at times. He was noted to have fever of 102.5 and heart rate 100 and was admitted to the hospitalist service for sepsis. Blood cultures are + and I am asked to evaluate the catheter   PAST MEDICAL HISTORY: 1.  End-stage renal disease on hemodialysis.  2.  Depression.  3.  Type 2 diabetes mellitus.  4.  Hypertension.  5.  Dementia.  6.  Baseline confusion.   PAST SURGICAL HISTORY: Unknown.   Home Medications: Medication Instructions Status  isosorbide mononitrate 60 mg oral tablet, extended release 1 tab(s) orally once a day for hypertension (10 am) Active  clopidogrel 75 mg oral tablet 1 tab(s) orally once a day for anticoagulation (10 am) Active  sertraline 100 mg oral tablet 1 tab(s) orally once a day for mood (10 am) Active  Super B Complex Vitamin B Complex oral tablet 1 tab(s) orally once a day for supplement (10 am) Active  carvedilol 25 mg oral tablet 1 tab(s) orally 2 times a day for hypertension (10 am, 10 pm) Active  pantoprazole 40 mg oral delayed release tablet 1 tab(s) orally 2 times a day for esophageal reflux (630 am, 430 pm) Active  calcium acetate 667 mg oral capsule 3 cap(s) (2001 mg) orally 3 times a day (with meals) (8 am, 1 pm, 5 pm) Active  hydrALAZINE 25 mg oral tablet 3 tab(s) (75 mg) orally 3 times a day (10 am, 2 pm, 10 pm) Active  calcium acetate 667 mg oral capsule 1 cap(s) orally once a day (at bedtime) with a snack (10 pm) Active  Lantus 100 units/mL subcutaneous solution 12 unit(s) subcutaneous once a day (at bedtime) (10 pm) Active  Q-Tussin 100 mg/5 mL oral liquid 10 milliliter(s) (200 mg)  orally every 4 hours, As Needed for cough Active  Nitrostat 0.4 mg sublingual tablet 1 tab(s) sublingual every 5 minutes, As Needed - for Chest Pain Active  ondansetron 4 mg oral tablet 1 tab(s) orally every 8 hours, As Needed - for Nausea, Vomiting Active  LORazepam Apply 1 mg (1 mL) of gel topically to affected area 2 times a day, As Needed anxiety/agitation Active  acetaminophen-oxyCODONE 325 mg-5 mg oral tablet 1 tab(s) orally every 6 hours, As Needed - for Pain Active  atorvastatin 80 mg oral tablet 1 tab(s) orally once a day (at bedtime) for hypercholesterolemia (10 pm) Active  ferrous sulfate 325 mg oral tablet 1 tab(s) orally 3 times a day for supplement (10 am, 2 pm, 10 pm) Active  NovoLOG 100 units/mL subcutaneous solution 2 unit(s) subcutaneous 3 times a day (before meals) (8 am, 12 pm, 530 pm) Active    No Known Allergies:   Case History:  Family History Non-Contributory   Social History negative tobacco, negative ETOH, negative Illicit drugs   Review of Systems:  ROS Pt not able to provide ROS   Physical Exam:  GEN well developed, well nourished, no acute distress   HEENT hearing intact to voice, dry oral mucosa   NECK supple  trachea midline   RESP normal resp effort  no use of accessory muscles   CARD irregular rate  no JVD   VASCULAR  ACCESS AV fistula present  Dialysis catheter present  Good bruit  Good thrill  -- Purulent drainage  mild tenderness over the catheter   ABD denies tenderness  soft   EXTR negative cyanosis/clubbing, negative edema   SKIN No ulcers, skin turgor poor   Nursing/Ancillary Notes: **Vital Signs.:   17-Apr-15 04:51  Vital Signs Type Routine  Temperature Temperature (F) 98  Celsius 36.6  Temperature Source oral  Pulse Pulse 70  Respirations Respirations 20  Systolic BP Systolic BP 309  Diastolic BP (mmHg) Diastolic BP (mmHg) 67  Mean BP 95  Pulse Ox % Pulse Ox % 95  Pulse Ox Activity Level  At rest  Oxygen Delivery Room  Air/ 21 %   Thyroid:  17-Apr-15 04:20   Thyroid Stimulating Hormone 1.70 (0.45-4.50 (International Unit)  ----------------------- Pregnant patients have  different reference  ranges for TSH:  - - - - - - - - - -  Pregnant, first trimetser:  0.36 - 2.50 uIU/mL)  Hepatic:  17-Apr-15 04:20   Bilirubin, Total 0.4  Alkaline Phosphatase 103 (45-117 NOTE: New Reference Range 09/30/13)  SGPT (ALT) 20  SGOT (AST) 21  Total Protein, Serum  5.9  Albumin, Serum  2.4  Routine Micro:  17-Apr-15 18:57   Micro Text Report MISC. AEROBIC CULTURE   ORGANISM 1                HEAVY GROWTH PSEUDOMONAS AERUGINOSA   COMMENT                   SENSITIVITIES TO FOLLOW   ANTIBIOTIC                       Culture Comment SENSITIVITIES TO FOLLOW  Result(s) reported on 26 Feb 2014 at 10:19AM.  Organism Name PSEUDOMONAS AERUGINOSA  Organism Quantity HEAVY GROWTH  Specimen Source CATH TIP  Organism 1 HEAVY GROWTH PSEUDOMONAS AERUGINOSA  Routine Chem:  17-Apr-15 04:20   Glucose, Serum  122  BUN  68  Creatinine (comp)  7.68  Sodium, Serum  134  Potassium, Serum 4.2  Chloride, Serum 102  CO2, Serum 24  Calcium (Total), Serum  7.3  Anion Gap 8  Osmolality (calc) 289  eGFR (African American)  7  eGFR (Non-African American)  6 (eGFR values <71m/min/1.73 m2 may be an indication of chronic kidney disease (CKD). Calculated eGFR is useful in patients with stable renal function. The eGFR calculation will not be reliable in acutely ill patients when serum creatinine is changing rapidly. It is not useful in  patients on dialysis. The eGFR calculation may not be applicable to patients at the low and high extremes of body sizes, pregnant women, and vegetarians.)  Routine Hem:  17-Apr-15 04:20   WBC (CBC)  12.3  RBC (CBC)  3.47  Hemoglobin (CBC)  11.0  Hematocrit (CBC)  33.9  Platelet Count (CBC) 185  MCV 98  MCH 31.7  MCHC 32.4  RDW  15.8  Neutrophil % 83.5  Lymphocyte % 4.1  Monocyte % 7.3   Eosinophil % 4.4  Basophil % 0.7  Neutrophil #  10.3  Lymphocyte #  0.5  Monocyte # 0.9  Eosinophil # 0.5  Basophil # 0.1 (Result(s) reported on 24 Feb 2014 at 04:55AM.)   XRay:    16-Apr-15 14:22, Chest Portable Single View  Chest Portable Single View   REASON FOR EXAM:    fever, AMS  COMMENTS:       PROCEDURE: DXR -  DXR PORTABLE CHEST SINGLE VIEW  - Feb 23 2014  2:22PM     CLINICAL DATA:  fever, unresponsive, could not follow breathing  instructions    EXAM:  PORTABLE CHEST - 1 VIEW    COMPARISON:DG CHEST 1V PORT dated 09/04/2013    FINDINGS:  Low lung volumes. Cardiac silhouette is enlarged. A right internal  jugular dialysis catheter with tip projecting in the region of the  superior vena caval right atrial junction. Atherosclerotic  calcifications within the aortic arch. Lungs are clear. Degenerative  changes, mild, appreciated within the shoulders.     IMPRESSION:  No active disease.      Electronically Signed    By: Margaree Mackintosh M.D.    On: 02/23/2014 14:27         Verified By: Mikki Santee, M.D., MD    Impression ASSESSMENT AND PLAN: 1.  Catheter related sepsis and acute encephalopathy. We will start the patient on ceftriaxone, get urine and blood cultures. Does not look fluid overload at this time. I will remove the catheter at the bedside and culture the tip.  He does not appear to need dialysis at this time so I will not place a temp cath at this point. 2.  End-stage renal disease. On hemodialysis. Consult nephrology for continuation of dialysis while the patient is in the hospital.  3.  Accelerated hypertension. Blood pressure is improved. This was likely secondary from the agitation the patient had earlier. Will continue his home medications. Use IV p.r.n. medications as needed.  4.  Diabetes mellitus type 2. Continue the patient's sliding scale. Will hold the Levemir as the patient is confused and I doubt if he can take his full regular diet.  5.   Deep vein thrombosis prophylaxis with heparin.  6. Afib rate controlled. Likely from acute illness. Check TSH.   Plan level 3 consult   Electronic Signatures: Hortencia Pilar (MD)  (Signed 19-Apr-15 16:33)  Authored: General Aspect/Present Illness, Home Medications, Allergies, History and Physical Exam, Vital Signs, Labs, Radiology, Impression/Plan   Last Updated: 19-Apr-15 16:33 by Hortencia Pilar (MD)

## 2015-03-03 NOTE — Op Note (Signed)
PATIENT NAME:  Albert Grant, BURGGRAF MR#:  [id number] DATE OF BIRTH:  1941-10-16  DATE OF PROCEDURE:  02/24/2014  PREOPERATIVE DIAGNOSIS: Catheter-related sepsis.   POSTOPERATIVE DIAGNOSIS: Catheter-related sepsis.   PROCEDURE PERFORMED: Removal of cuffed tunneled dialysis catheter right internal jugular.   PROCEDURE PERFORMED BY: Dr. Lorretta Harp.   DESCRIPTION OF PROCEDURE: The patient is positioned at the bedside, 1% lidocaine is infiltrated in the soft tissues. A small incision is created overlying the palpable cuff and the dissection is carried down. The cuff is freed from surrounding tissues. The catheter is transected, and subsequently removed without difficulty, 4-0 Monocryl was used to close the tunnel and then close the skin with a subcuticular suture. The patient tolerated the procedure well. Catheter tip was sent for culture.    ____________________________ Renford Dills, MD ggs:lt D: 02/24/2014 20:40:12 ET T: 02/25/2014 03:07:55 ET JOB#: [id number]  cc: Renford Dills, MD, <Dictator> Munsoor Lizabeth Leyden, MD Renford Dills MD ELECTRONICALLY SIGNED 02/28/2014 11:18
